# Patient Record
Sex: Male | Born: 1945 | Race: Black or African American | Hispanic: No | Marital: Single | Smoking: Former smoker
Health system: Southern US, Community
[De-identification: ages and names within clinical notes are randomized; demographics above are authoritative.]

## PROBLEM LIST (undated history)

## (undated) DIAGNOSIS — E785 Hyperlipidemia, unspecified: Secondary | ICD-10-CM

## (undated) DIAGNOSIS — IMO0001 Reserved for inherently not codable concepts without codable children: Secondary | ICD-10-CM

## (undated) DIAGNOSIS — C609 Malignant neoplasm of penis, unspecified: Secondary | ICD-10-CM

## (undated) DIAGNOSIS — IMO0002 Reserved for concepts with insufficient information to code with codable children: Secondary | ICD-10-CM

## (undated) DIAGNOSIS — R000 Tachycardia, unspecified: Secondary | ICD-10-CM

## (undated) DIAGNOSIS — I214 Non-ST elevation (NSTEMI) myocardial infarction: Secondary | ICD-10-CM

## (undated) DIAGNOSIS — I639 Cerebral infarction, unspecified: Secondary | ICD-10-CM

## (undated) DIAGNOSIS — I10 Essential (primary) hypertension: Secondary | ICD-10-CM

## (undated) DIAGNOSIS — R591 Generalized enlarged lymph nodes: Secondary | ICD-10-CM

## (undated) HISTORY — DX: Tachycardia, unspecified: R00.0

## (undated) HISTORY — PX: OTHER SURGICAL HISTORY: SHX169

---

## 2000-09-30 ENCOUNTER — Encounter: Payer: Self-pay | Admitting: Internal Medicine

## 2000-09-30 ENCOUNTER — Inpatient Hospital Stay (HOSPITAL_COMMUNITY): Admission: EM | Admit: 2000-09-30 | Discharge: 2000-10-02 | Payer: Self-pay | Admitting: Internal Medicine

## 2000-10-01 ENCOUNTER — Encounter: Payer: Self-pay | Admitting: Internal Medicine

## 2001-12-30 ENCOUNTER — Encounter: Payer: Self-pay | Admitting: Emergency Medicine

## 2001-12-30 ENCOUNTER — Emergency Department (HOSPITAL_COMMUNITY): Admission: EM | Admit: 2001-12-30 | Discharge: 2001-12-30 | Payer: Self-pay | Admitting: Emergency Medicine

## 2002-01-07 ENCOUNTER — Encounter: Payer: Self-pay | Admitting: Emergency Medicine

## 2002-01-07 ENCOUNTER — Inpatient Hospital Stay (HOSPITAL_COMMUNITY): Admission: EM | Admit: 2002-01-07 | Discharge: 2002-01-10 | Payer: Self-pay | Admitting: Emergency Medicine

## 2002-01-09 ENCOUNTER — Encounter: Payer: Self-pay | Admitting: Cardiology

## 2002-08-15 ENCOUNTER — Emergency Department (HOSPITAL_COMMUNITY): Admission: EM | Admit: 2002-08-15 | Discharge: 2002-08-15 | Payer: Self-pay | Admitting: Emergency Medicine

## 2004-12-05 ENCOUNTER — Emergency Department (HOSPITAL_COMMUNITY): Admission: EM | Admit: 2004-12-05 | Discharge: 2004-12-05 | Payer: Self-pay | Admitting: Emergency Medicine

## 2010-03-04 ENCOUNTER — Emergency Department (HOSPITAL_COMMUNITY): Admission: EM | Admit: 2010-03-04 | Discharge: 2010-03-04 | Payer: Self-pay | Admitting: Emergency Medicine

## 2011-01-13 NOTE — H&P (Signed)
[place]. [facility]  Patient:    Norman Mendez, Norman Mendez Visit Number: [id number] MRN: [id number]          Service Type: MED Location: [phone] Attending Physician:  Cathren Laine Dictated by:   Lewayne Bunting, M.D. [facility] Admit Date:  01/07/2002                           History and Physical  REFERRING PHYSICIAN:  None.  CARDIOLOGIST:  New, Dr. Lewayne Bunting.  CURRENT COMPLAINT:  Chest pain.  HISTORY OF PRESENT ILLNESS:  Norman Mendez is a 65 year old African-American male who presents with sudden onset of substernal chest pain.  The patient was awakened at 3 a.m. this morning with severe left-sided sharp substernal chest pain which was unrelenting.  It was, however, not associated with nausea, vomiting, shortness of breath, or diaphoresis.  He did report the pain was somewhat pleuritic in nature.  Despite ongoing pain the patient went to work early in the morning where gradually he felt he was getting worse with increased substernal chest pain and worsening dyspnea.  Subsequently, 911 was called and the patient presented to the emergency room.  The patient demonstrated sinus tachycardia and minor ST elevation in leads I and aVL with T wave inversion.  Electrocardiogram also demonstrated left ventricular hypertrophy.  The patient reported a 9/10 substernal chest pain without radiation to the back and without radiation into either left or right arm. There was a slight discrepancy between the blood pressure in the left and right arm, approximately 20 mmHg.  ALLERGIES:  No known drug allergies.  MEDICATIONS: 1. Hydrocodone p.r.n. pain. 2. Ibuprofen p.r.n. pain.  PAST MEDICAL HISTORY: 1. History of pneumonia. 2. Multiple lacerations, work related.  SOCIAL HISTORY:  The patient lives in [place] by himself.  He raises a 32 year old daughter.  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  He smokes two packs a day for the last 40 years.  The patient does drink  alcohol, occasionally a beer a day.  Denies any drug use.  FAMILY HISTORY:  Mother died from diabetes mellitus at age 18.  Father died in his 60s and had history of alcohol use.  He has several siblings without known coronary artery disease.  REVIEW OF SYSTEMS:  No fever or chills.  No headache or sore throat.  No rash or lesions.  Positive for chest pain as outlined above.  No dyspnea on exertion or PND, palpitations, or syncope.  No frequency or dysuria.  Positive for myalgias and arthralgias in right shoulder.  No nausea or vomiting.  No polyuria or polydipsia.  PHYSICAL EXAMINATION:  VITAL SIGNS:  Blood pressure 138/76 in the right arm, 156/85 in the left arm. Respirations 20, heart rate 80 beats per minute.  Weight 155.  GENERAL:  An African-American male with somewhat flat affect but in no acute distress.  HEENT:  [place] ARB/AT.  PERRLA, EOMI.  NECK:  Supple.  No bruit or JVD.  HEART:  Regular rate and rhythm, normal S1, S2.  LUNGS:  Clear.  SKIN:  No rash or lesions.  ABDOMEN:  Soft, nontender.  GENITOURINARY AND RECTAL:  Deferred.  Abdominal aorta is palpable but does not appear to be dilated.  EXTREMITIES:  No cyanosis, clubbing, or edema.  NEUROLOGIC:  Patient is alert, oriented, and grossly nonfocal.  LABORATORY DATA:  Chest x-ray:  No acute changes; infiltrate, left lower lobe with possible pleural effusion.  EKG:  Heart rate  85 beats per minute, rhythm is normal sinus rhythm; significant LVH with strain pattern; borderline ST segment changes in I and aVL.  Hemoglobin 15.4, hematocrit 45.2, white count 10.2.  Sodium 137, potassium 3.9, chloride 103, bicarb 26, BUN 6, creatinine 0.8, blood sugar 112.  Enzymes show CK 575, MB 32.4, troponin 1.05.  IMPRESSION AND PLAN: 1. Substernal chest pain.  The patient does report chest pain that is    concerning for an acute coronary syndrome.  A 12-lead electrocardiogram    shows concerning changes in I and aVL.  However,  bedside echocardiogram    does not show significant wall motion abnormalities.  There is significant    left ventricular hypertrophy.  The patient, however, has ongoing pain on    aspirin, beta blocker, nitroglycerin, and heparin.  The patient will    proceed with diagnostic cardiac catheterization due to diagnostic    uncertainty.  I doubt the patient has significant coronary artery disease,    and if this is the case further diagnosis should be pursued.  Aortogram    will need to be done to rule out dissection.  D-dimer has been ordered    to rule out pulmonary embolism.  Chest x-ray demonstrates a left lower lobe    infiltrate; cannot rule out pulmonary infarction. 2. Left lower lobe infiltrate, rule out pneumonia versus pulmonary infarct    versus pleural effusion. 3. Left ventricular hypertrophy with secondary electrocardiogram changes. 4. Disposition.  I have discussed the risks and benefits of a diagnostic    catheterization and the patient has agreed to proceed.  This will help [place]    rule out any diagnostic uncertainty.Dictated by:   Lewayne Bunting, M.D. [facility]  Attending Physician:  Cathren Laine DD:  01/07/02 TD:  01/07/02 Job: [id number]

## 2011-01-13 NOTE — H&P (Signed)
[place]. [facility]  Patient:    Norman Mendez, Norman Mendez Visit Number: [id number] MRN: [id number]          Service Type: EMS Location: [place] Attending Physician:  Cathren Laine Dictated by:   Lewayne Bunting, M.D. [facility] Admit Date:  01/07/2002                           History and Physical  No dictation. Dictated by:   Lewayne Bunting, M.D. [facility] Attending Physician:  Cathren Laine DD:  01/07/02 TD:  01/07/02 Job: [id number]

## 2011-01-13 NOTE — Cardiovascular Report (Signed)
[place]. [facility]  Patient:    Norman Mendez, Norman Mendez Visit Number: [id number] MRN: [id number]          Service Type: MED Location: [phone] Attending Physician:  Learta Codding Dictated by:   Noralyn Pick. Eden Emms, M.D. [facility] Admit Date:  01/07/2002                          Cardiac Catheterization  INDICATIONS:  A 65 year old patient of Dr. Andee Lineman admitted with chest pain. The patient had significant substernal chest pain with CPKs that were positive.  The case was done on a semi-emergent basis.  RESULTS: 1. The left main coronary artery was normal.  2. The left anterior descending artery had 30% multiple discrete lesions in    the mid and distal vessel.  The first and second diagonal branch were small    without critical lesions.  3. The left circumflex coronary artery was dominant.  There was a very large    first obtuse marginal branch.  The distal portion of this vessel had a    70-80% tubular type lesion before a large bifurcation point.  The distal    circumflex had a PDA and 3 posterolateral branches and was normal.  4. The right coronary artery was small and nondominant.  There was a 50-60%    lesion proximally.  RAO ventriculography:  RAO ventriculography was normal.  Ejection fraction was 60%.  There was no gradient across the aortic valve and no MR.  Aortic pressure was 150/88.  LV pressure was 151/11.  Because of the patients significant chest pain and no lesions with TIMI-2 flow or less, aortic root and distal aortic injection were performed.  There was no evidence of aortic dissection or abdominal aortic aneurysm.  IMPRESSION:  Positive CPKs and chest pain.  I suspect the culprit lesion is the distal circumflex obtuse marginal.  The films will be reviewed with Dr. Riley Kill and he will most likely undergo angioplasty of this obtuse marginal.  Since the patient has a question of a left lower lobe infiltrate or effusion and a positive  d-dimer, I still think it may be worthwhile to do a followup CT scan of his chest in the morning.  Further recommendations will be based on the review by Dr. Riley Kill. Dictated by:   Noralyn Pick Eden Emms, M.D. [facility] Attending Physician:  Learta Codding DD:  01/07/02 TD:  01/08/02 Job: [id number]

## 2011-01-13 NOTE — H&P (Signed)
[place]. [facility]  Patient:    Norman Mendez, Norman Mendez Visit Number: [id number] MRN: [id number]          Service Type: EMS Location: [place] Attending Physician:  Cathren Laine Dictated by:   Lewayne Bunting, M.D. [facility] Admit Date:  01/07/2002                           History and Physical  INCOMPLETE  CARDIOLOGIST:  Samara Deist, M.D.  CHIEF COMPLAINT:  Chest pain.  HISTORY OF PRESENT ILLNESS:  Norman Mendez is a 65 year old African-American male who presents with sudden onset of substernal chest pain.  The patient was awoken at 3 oclock this morning with severe left-sided sharp substernal chest pain which was unrelenting.  It was not associated with nausea, vomiting, shortness of breath, or diaphoresis.  He did report the pain was somewhat pleuritic in nature.  Despite ongoing pain, the patient went to work early in the morning, where gradually he felt he was getting worse with increased substernal chest pain and worsening dyspnea.  Subsequently 911 was called and the patient presented to the emergency room.  The patient demonstrates sinus tachycardia and minor ST elevation in leads I and aVL with a T-wave inversion. Electrocardiogram also demonstrates left ventricular hypertrophy.  The patient reported a 9/10 substernal chest pain without radiation to the back and without radiation into either left or right arm.  There was a slight discrepancy between the blood pressure in the left and right arm, approximately 20 mmHg.  ALLERGIES:  No known drug allergies.  MEDICATIONS: 1. Hydrocodone p.r.n. pain. 2. Ibuprofen p.r.n. pain.  PAST MEDICAL HISTORY: 1. History of pneumonia. 2. Multiple lacerations, work-related.  SOCIAL HISTORY:  The patient lives in [place] by himself.  He raises a 56 year old daughter.  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  He smokes two packs a day for the last 40 years.  The patient does drink alcohol occasionally, a beer a  day.  He denies any drug use.  FAMILY HISTORY:  His mother died from diabetes mellitus at age 22.  Father died in his 9s and has a history of alcohol use.  He has several siblings without known coronary artery disease.  REVIEW OF SYSTEMS:  No fevers or chills.  No headache or sore throat.  No rash or lesions.  Positive for chest pain as outlined above.  No dyspnea on exertion, PND, palpitations, or syncope.  No frequency or dysuria.  Positive for myalgias and arthralgias in the right shoulder.  No nausea or vomiting. No polyuria or polydipsia.  PHYSICAL EXAMINATION:  VITAL SIGNS:  Blood pressure 138/76 in the right arm, 156/85 in the left arm. Respirations 20.  Heart rate 80 beats per minute.  Weight 155 pounds.  GENERAL:  An African-American male with somewhat flat affect but in no acute distress.  HEENT:  NCAT, PERRLA, EOMI.  NECK:  Supple.  No bruit or JVD.  CHEST:  Lungs clear.  SKIN:  No rash or lesions.  CARDIAC:  Regular rate and rhythm, normal S1, S2.  ABDOMEN:  Soft, nontender.  Abdominal aorta is palpable but does not appear to be dilated.  GENITOURINARY/RECTAL:  Deferred.  EXTREMITIES:  No cyanosis, clubbing, or edema.  NEUROLOGIC:  The patient is alert and oriented and grossly nonfocal.  LABORATORY DATA:  Chest x-ray:  No acute changes.  Infiltrate left lower lobe with possible pleural effusion.  EKG:  Heart rate 85 beats  per minute.  The rhythm is normal sinus rhythm.  Significant LVH with strain pattern. Borderline ST segment changes in I, aVL.  Hemoglobin 15.4, hematocrit 45.2, white count 10.2.  Sodium 137, potassium 3.9, chloride 103, bicarb 26, BUN 6, creatinine 0.8, blood sugar 112. Enzymes:  CK 575, MB 32.4, troponin 1.05.  IMPRESSION AND PLAN: 1. Substernal chest pain.  The patient does report chest pain that is concerning for an acute coronary syndrome.  Twelve-lead electrocardiogram shows concerning changes in I and aVL.  However, bedside  echocardiogram does not show significant wall motion abnormalities.  There is significant left ventricular hypertrophy.  The patient, however, has ongoing pain on aspirin, beta blocker, nitroglycerin, and heparin.  The patient will proceed with diagnostic cardiac catheterization due to a diagnostic uncertainty at that, the patient has significant coronary artery disease and if this is the case, further diagnosis should be pursued.  Aortogram will need to be done to rule out dissection.  A D-dimer has been ordered to rule out pulmonary embolus. Dictated by:   Lewayne Bunting, M.D. [facility] Attending Physician:  Cathren Laine DD:  01/07/02 TD:  01/07/02 Job: [id number]

## 2011-01-13 NOTE — Discharge Summary (Signed)
[place]. [facility]  Patient:    Norman Mendez, Norman Mendez Visit Number: [id number] MRN: [id number]          Service Type: MED Location: [phone] Attending Physician:  Learta Codding Dictated by:   Pennelope Bracken, N.P. Admit Date:  01/07/2002 Disc. Date: 01/10/02   CC:         [facility]   Discharge Summary  DATE OF BIRTH:  [birth year].  REASON FOR ADMISSION:  Chest pain.  DISCHARGE DIAGNOSES: 1. Coronary artery disease, status post angiography this admission revealing    normal left main, left anterior descending 30% stenosis midvessel and    distally, large left-dominant circumflex, first obtuse marginal distal    lesion estimated to be 70-80% with normal flow.  Right coronary artery was    small and nondominant, 50-60% proximal stenosis.  Left ventricular function    normal with an ejection fraction of 60%.  Aorta revealing no dissection. 2. 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 3. Postcatheterization hematoma, right femoral artery site. 4. Poor health maintenance, no primary care Charvis Lightner. 5. Tobacco abuse.  HISTORY OF PRESENT ILLNESS:  This delightful 65 year old African-American gentleman with cardiac history as outlined above was awakened from sleep at 3 a.m. the day of admission with a sudden-onset substernal chest pain.  The patient was unrelieved by positional change or Goody powders and was not associated with nausea, vomiting, or diaphoresis.  The patient went to work that morning with the pain, and it gradually worsened and he became dyspneic. Emergency services was contacted and he was brought here to the emergency room, where he was found to be in sinus tachycardia with minor ST elevation in leads I and aVL and T-wave inversion in both aVR and aVL.  He continued in the ED with 9/10 substernal chest pain that was not immediately relieved with nitroglycerin.  Baseline cardiac  enzymes were found to be elevated, and so he was admitted and taken to the cardiac catheterization lab urgently.  HOSPITAL COURSE:  Coronary angiography was performed by Theron Arista C. Eden Emms, M.D., with findings as detailed above.  The patient tolerated the procedure well, and films were reviewed by Arturo Morton. Riley Kill, M.D., and Lewayne Bunting, M.D.  It was elected not to proceed with an intervention given that the patient had sustained a large hematoma at his sheath site.  Pressure was applied to this area with excellent resolution, and the patient recovered uneventfully.  Throughout the hospitalization the patient did experience some early short spells of fleeting sharp left anterior chest pain.  These were treated with nitroglycerin and a p.o. nitrate was added to his regimen of aspirin, beta blocker, and heparin.  A CT chest was performed, and this was negative for abnormality.  An adenosine Cardiolite was performed to assess for evidence of ischemia, and this showed a fixed anterolateral defect consistent with angiographic findings.  No evidence of ischemia was seen, and EF was confirmed at 60%.  The patient experienced no further chest pain.  His IV nitroglycerin and heparin were discontinued and on May 16, he was deemed suitable for discharge by Luis Abed, M.D.  DISCHARGE PHYSICAL EXAMINATION:  VITAL SIGNS:  Telemetry revealed normal sinus rhythm x24 hours.  Vital signs as follows:  117/70, 72, 20, 98% on room air.  GENERAL:  On the day of discharge the patient offered no complaints of chest pain or dyspnea and was anxious to go home.  In no acute distress.  NECK:  No JVD.  CHEST:  Clear to auscultation bilaterally.  CARDIAC:  Regular rate and rhythm, S1, S2.  No obvious murmurs.  EXTREMITIES:  Without clubbing, cyanosis, or edema.  Catheter site benign.  No signs of hematoma or bruit.  LABORATORY DATA:  Chemistry day of discharge as follows:  Sodium 137, potassium 4.0,  chloride 104, CO2 27, BUN 8, creatinine 0.8, glucose 103.  PTT was 28.  Hemogram as follows:  WBC 8.0, hemoglobin 13.4, hematocrit 39.4, and platelets 209.  Cardiac enzymes:  Set #1, CK 723, MB 50.0, Troponin I 3.72. Set #2, CK 950, MB 65.4, troponin I 7.31.  Set #3, CK 821, MB 33.5, troponin I 8.29.  D-dimer negative.  Lipids were as follows:  Total cholesterol 210, triglycerides 88, HDL 68, LDL 124.  TSH 0.987.  Twelve-lead EKG day of discharge showed normal sinus rhythm with signs of left ventricular hypertrophy, axis normal.  Chest x-ray on admission revealed small infiltrate left lower lobe with possible pleural effusion.  DISPOSITION:  The patient is discharged to home.  DISCHARGE MEDICATIONS: 1. Aspirin 325 mg one q.d. 2. Metoprolol 25 mg one b.i.d. 3. Lipitor 10 mg one q.d. 4. Imdur 30 mg one q.d. 5. Nitroglycerin 0.4 mg sublingual one every five minutes x3 p.r.n. chest    pain.  DISCHARGE INSTRUCTIONS:  The patient is advised to back off on ibuprofen for awhile until his groin stabilizes.  Activity:  No heavy lifting, sexual activity, or tub baths x2 days.  He is urged to walk 30 minutes a day and may return to work on Monday.  Diet recommended:  A heart-healthy diet.  The patient agrees to call the office if his groin wound changes in any way.  FOLLOW-UP  Follow-up will be as arranged at [facility] outpatient clinic on Tuesday, May 20, at 2:30.  The patient was given explicit directions and agrees to come to the clinic for management of his cardiac risk factors. Follow-up with Dian Queen, P.A.C., is scheduled for May 23 at 3:30.  The patient will see Dr. Andee Lineman on June 16 at 3 oclock at the office.  He will stop by the office today for samples of his antihypertensives, and much discussion was given to the importance of tobacco cessation counseling. He agrees to call in the interim with any questions or concerns, change or increase in symptoms.Dictated by:   Pennelope Bracken, N.P. Attending Physician:  Learta Codding  DD:  01/10/02 TD:  01/10/02 Job: [id number]

## 2011-11-12 ENCOUNTER — Encounter (HOSPITAL_COMMUNITY): Payer: Self-pay | Admitting: *Deleted

## 2011-11-12 ENCOUNTER — Emergency Department (HOSPITAL_COMMUNITY)
Admission: EM | Admit: 2011-11-12 | Discharge: 2011-11-12 | Disposition: A | Discharge status: Home / Self Care | Payer: Medicare Other | Attending: Emergency Medicine | Admitting: Emergency Medicine

## 2011-11-12 ENCOUNTER — Emergency Department (HOSPITAL_COMMUNITY): Payer: Medicare Other

## 2011-11-12 ENCOUNTER — Other Ambulatory Visit: Payer: Self-pay

## 2011-11-12 DIAGNOSIS — F17200 Nicotine dependence, unspecified, uncomplicated: Secondary | ICD-10-CM | POA: Insufficient documentation

## 2011-11-12 DIAGNOSIS — R066 Hiccough: Secondary | ICD-10-CM

## 2011-11-12 MED ORDER — CHLORPROMAZINE HCL 50 MG PO TABS: 50.0000 mg | ORAL_TABLET | Freq: Three times a day (TID) | ORAL | Status: DC

## 2011-11-12 NOTE — ED Provider Notes (Signed)
History     CSN: [id number]  Arrival date & time 11/12/11  1148   First MD Initiated Contact with Patient 11/12/11 1230      Chief Complaint  Patient presents with  . Hiccups    (Consider location/radiation/quality/duration/timing/severity/associated sxs/prior treatment) HPI Comments: The patient presents complaining of a one week history of persistent hiccups.  He denies any other symptoms.  It is worse at night and causing to not be able to sleep.  There are no alleviating factors.  He smokes "about three cigarettes per week".  No cough, chest pain, or shortness of breath.  The history is provided by the patient.    History reviewed. No pertinent past medical history.  History reviewed. No pertinent past surgical history.  History reviewed. No pertinent family history.  History  Substance Use Topics  . Smoking status: Current Everyday Smoker    Types: Cigarettes  . Smokeless tobacco: Not on file  . Alcohol Use: Yes      Review of Systems  All other systems reviewed and are negative.    Allergies  Review of patient's allergies indicates no known allergies.  Home Medications  No current outpatient prescriptions on file.  BP 133/89  Pulse 139  Temp(Src) 99.9 F (37.7 C) (Oral)  Resp 20  SpO2 95%  Physical Exam  Nursing note and vitals reviewed. Constitutional: He is oriented to person, place, and time. He appears well-developed and well-nourished.  HENT:  Head: Normocephalic and atraumatic.  Neck: Normal range of motion. Neck supple.  Cardiovascular: Normal rate and regular rhythm.  Exam reveals no friction rub.   No murmur heard. Pulmonary/Chest: Effort normal and breath sounds normal. No respiratory distress.  Abdominal: Soft. Bowel sounds are normal. He exhibits no distension. There is no tenderness.  Musculoskeletal: Normal range of motion. He exhibits no edema.  Neurological: He is alert and oriented to person, place, and time.  Skin: Skin is warm  and dry. He is not diaphoretic.    ED Course  Procedures (including critical care time)  Labs Reviewed - No data to display No results found.   No diagnosis found.    MDM  The xray shows an elevated left hemidiaphragm, but no other acute process.  Will prescribe thorazine, follow up with pcp if not getting better.        Geoffery Lyons, MD 11/12/11 1335

## 2011-11-12 NOTE — ED Notes (Signed)
Reports having hiccups x 1 week, no other complaints. HR is 140 at triage, ekg being done. Airway intact, resp e/u.

## 2011-11-12 NOTE — Discharge Instructions (Signed)
 Hiccups Hiccups are caused by a sudden contraction of the muscles between the ribs and the muscle under your lungs (diaphragm). When you hiccup, the top of your windpipe (glottis) closes immediately after your diaphragm contracts. This makes the typical 'hic' sound. A hiccup is a reflex that you cannot stop. Unlike other reflexes such as coughing and sneezing, hiccups do not seem to have any useful purpose. There are 3 types of hiccups:   Benign bouts: last less than 48 hours.   Persistent: last more than 48 hours but less than 1 month.   Intractable: last more than 1 month.  CAUSES  Most people have bouts of hiccups from time to time. They start for no apparent reason, last a short while, and then stop. Sometimes they are due to:  A temporary swollen stomach caused by overeating or eating too fast, eating spicy foods, drinking fizzy drinks, or swallowing air.   A sudden change in temperature (very hot or cold foods or drinks, a cold shower).   Drinking alcohol or using tobacco.  There are no particular tests used to diagnose hiccups. Hiccups are usually considered harmless and do not point to a serious medical condition. However, there can be underlying medical problems that may cause hiccups, such as pneumonia, diabetes, metabolic problems, tumors, abdominal infections or injuries, and neurologic problems.You must follow up with your caregiver if your symptoms persist or become a frequent problem. TREATMENT   Most cases need no treatment. A bout of benign hiccups usually does not last long.   Medicine is sometimes needed to stop persistent hiccups. Medicine may be given intravenously (IV) or by mouth.   Hypnosis or acupuncture may be suggested.   Surgery to affect the nerve that supplies the diaphragm may be tried in severe cases.   Treatment of an underlying cause is needed in some cases.  HOME CARE INSTRUCTIONS  Popular remedies that may stop a bout of hiccups include:  Gargling  ice water.   Swallowing granulated sugar.   Biting on a lemon.   Holding your breath, breathing fast, or breathing into a paper bag.   Bearing down.   Gasping after a sudden fright.   Pulling your tongue gently.   Distraction.  SEEK MEDICAL CARE IF:   Hiccups last for more than 48 hours.   You are given medicine but your hiccups do not get better.   New symptoms show up.   You cannot sleep or eat due to the hiccups.   You have unexpected weight loss.   You have trouble breathing or swallowing.   You develop severe pain in your abdomen or other areas.   You develop numbness, tingling, or weakness.  Document Released: 10/23/2001 Document Revised: 08/03/2011 Document Reviewed: 10/05/2010 [facility] Patient Information 2012 [place], [place].

## 2011-11-15 ENCOUNTER — Emergency Department (HOSPITAL_COMMUNITY): Payer: Medicare Other

## 2011-11-15 ENCOUNTER — Encounter (HOSPITAL_COMMUNITY): Payer: Self-pay | Admitting: Emergency Medicine

## 2011-11-15 ENCOUNTER — Observation Stay (HOSPITAL_COMMUNITY)
Admission: EM | Admit: 2011-11-15 | Discharge: 2011-11-18 | Disposition: A | Discharge status: Home / Self Care | Payer: Medicare Other | Attending: Internal Medicine | Admitting: Internal Medicine

## 2011-11-15 ENCOUNTER — Other Ambulatory Visit: Payer: Self-pay

## 2011-11-15 DIAGNOSIS — R0602 Shortness of breath: Secondary | ICD-10-CM | POA: Insufficient documentation

## 2011-11-15 DIAGNOSIS — E871 Hypo-osmolality and hyponatremia: Secondary | ICD-10-CM | POA: Diagnosis present

## 2011-11-15 DIAGNOSIS — R000 Tachycardia, unspecified: Secondary | ICD-10-CM | POA: Diagnosis present

## 2011-11-15 DIAGNOSIS — R5381 Other malaise: Secondary | ICD-10-CM | POA: Insufficient documentation

## 2011-11-15 DIAGNOSIS — F17200 Nicotine dependence, unspecified, uncomplicated: Secondary | ICD-10-CM | POA: Insufficient documentation

## 2011-11-15 DIAGNOSIS — R7401 Elevation of levels of liver transaminase levels: Secondary | ICD-10-CM | POA: Diagnosis present

## 2011-11-15 DIAGNOSIS — I498 Other specified cardiac arrhythmias: Secondary | ICD-10-CM | POA: Insufficient documentation

## 2011-11-15 DIAGNOSIS — Z9181 History of falling: Secondary | ICD-10-CM | POA: Insufficient documentation

## 2011-11-15 DIAGNOSIS — R079 Chest pain, unspecified: Principal | ICD-10-CM | POA: Diagnosis present

## 2011-11-15 DIAGNOSIS — R7989 Other specified abnormal findings of blood chemistry: Secondary | ICD-10-CM | POA: Insufficient documentation

## 2011-11-15 DIAGNOSIS — Z720 Tobacco use: Secondary | ICD-10-CM | POA: Diagnosis present

## 2011-11-15 DIAGNOSIS — J189 Pneumonia, unspecified organism: Secondary | ICD-10-CM

## 2011-11-15 DIAGNOSIS — R066 Hiccough: Secondary | ICD-10-CM | POA: Diagnosis present

## 2011-11-15 DIAGNOSIS — R599 Enlarged lymph nodes, unspecified: Secondary | ICD-10-CM | POA: Insufficient documentation

## 2011-11-15 DIAGNOSIS — R42 Dizziness and giddiness: Secondary | ICD-10-CM | POA: Diagnosis present

## 2011-11-15 DIAGNOSIS — R509 Fever, unspecified: Secondary | ICD-10-CM | POA: Insufficient documentation

## 2011-11-15 LAB — DIFFERENTIAL
Lymphocytes Relative: 23 % (ref 12–46)
Lymphs Abs: 2.1 10*3/uL (ref 0.7–4.0)
Monocytes Absolute: 1 10*3/uL (ref 0.1–1.0)
Monocytes Relative: 10 % (ref 3–12)
Neutro Abs: 6 10*3/uL (ref 1.7–7.7)

## 2011-11-15 LAB — CBC
MCV: 87.8 fL (ref 78.0–100.0)
RBC: 4.67 MIL/uL (ref 4.22–5.81)
WBC: 9.1 10*3/uL (ref 4.0–10.5)

## 2011-11-15 LAB — POCT I-STAT TROPONIN I: Troponin i, poc: 0.01 ng/mL (ref 0.00–0.08)

## 2011-11-15 LAB — LACTIC ACID, PLASMA: Lactic Acid, Venous: 1.5 mmol/L (ref 0.5–2.2)

## 2011-11-15 LAB — BASIC METABOLIC PANEL
BUN: 10 mg/dL (ref 6–23)
CO2: 26 mEq/L (ref 19–32)
Calcium: 9 mg/dL (ref 8.4–10.5)
Chloride: 90 mEq/L — ABNORMAL LOW (ref 96–112)
GFR calc non Af Amer: 59 mL/min — ABNORMAL LOW (ref >90)
Glucose, Bld: 169 mg/dL — ABNORMAL HIGH (ref 70–99)

## 2011-11-15 MED ORDER — NITROGLYCERIN 0.4 MG SL SUBL
0.4000 mg | SUBLINGUAL_TABLET | SUBLINGUAL | Status: DC | PRN
  Filled 2011-11-15: qty 25

## 2011-11-15 MED ORDER — METOPROLOL TARTRATE 1 MG/ML IV SOLN
5.0000 mg | Freq: Once | INTRAVENOUS | Status: AC
  Administered 2011-11-15: 5 mg via INTRAVENOUS
  Filled 2011-11-15: qty 5

## 2011-11-15 MED ORDER — ASPIRIN 81 MG PO CHEW
324.0000 mg | CHEWABLE_TABLET | Freq: Once | ORAL | Status: AC
  Administered 2011-11-15: 324 mg via ORAL
  Filled 2011-11-15: qty 4

## 2011-11-15 MED ORDER — SODIUM CHLORIDE 0.9 % IV BOLUS (SEPSIS)
1000.0000 mL | Freq: Once | INTRAVENOUS | Status: AC
  Administered 2011-11-15: 1000 mL via INTRAVENOUS

## 2011-11-15 NOTE — ED Notes (Signed)
PT. BECAME LETHARGIC / UNRESPONSIVE WHILE WAITING AT TRIAGE , CHARGE NURSE NOTIFIED WILL MOVE TO A ROOM ASAP.

## 2011-11-15 NOTE — ED Notes (Signed)
Patient with chest pain, denies any pain at this time.  Patient denies any shortness of breath at this time.  Patient is tachycardic in the 120's, tachypneic in the 30's.  He is CAOx3, sleepy.

## 2011-11-15 NOTE — ED Notes (Signed)
PT. REPORTS MID STERNAL CHEST PAIN RADIATING TO UPPER BACK WITH SOB ONSET TODAY , DENIES NAUSEA , VOMITTING OR DIAPHORESIS.

## 2011-11-15 NOTE — ED Notes (Signed)
Patient awake, talking in full sentence, sleepy.

## 2011-11-15 NOTE — ED Provider Notes (Signed)
History     CSN: [id number]  Arrival date & time 11/15/11  2143   First MD Initiated Contact with Patient 11/15/11 2255      Chief Complaint  Patient presents with  . Chest Pain    (Consider location/radiation/quality/duration/timing/severity/associated sxs/prior treatment) Patient is a 66 y.o. male presenting with chest pain. The history is provided by the patient. No language interpreter was used.  Chest Pain The chest pain began 3 - 5 hours ago. Chest pain occurs intermittently. The chest pain is unchanged. The severity of the pain is moderate. The quality of the pain is described as aching and dull. The pain radiates to the upper back. Exacerbated by: no exacerbating or alleviating measures. Primary symptoms include fatigue and shortness of breath. Pertinent negatives for primary symptoms include no fever, no cough, no palpitations, no abdominal pain, no nausea, no vomiting and no dizziness.  The fatigue began today. The fatigue has been worsening since its onset.  The shortness of breath began today. The shortness of breath developed gradually. The shortness of breath is mild.  Pertinent negatives for associated symptoms include no diaphoresis, no lower extremity edema, no near-syncope, no numbness, no orthopnea, no paroxysmal nocturnal dyspnea and no weakness. He tried nothing for the symptoms. Risk factors include male gender.     History reviewed. No pertinent past medical history.  History reviewed. No pertinent past surgical history.  No family history on file.  History  Substance Use Topics  . Smoking status: Current Everyday Smoker    Types: Cigarettes  . Smokeless tobacco: Not on file  . Alcohol Use: Yes      Review of Systems  Constitutional: Positive for fatigue. Negative for fever, chills, diaphoresis, activity change and appetite change.  HENT: Negative for congestion, sore throat, rhinorrhea, neck pain and neck stiffness.   Respiratory: Positive for  shortness of breath. Negative for cough.   Cardiovascular: Positive for chest pain. Negative for palpitations, orthopnea and near-syncope.  Gastrointestinal: Negative for nausea, vomiting and abdominal pain.  Genitourinary: Negative for dysuria, urgency, frequency and flank pain.  Musculoskeletal: Positive for back pain. Negative for myalgias and arthralgias.  Neurological: Negative for dizziness, weakness, light-headedness, numbness and headaches.  All other systems reviewed and are negative.    Allergies  Review of patient's allergies indicates no known allergies.  Home Medications   Current Outpatient Rx  Name Route Sig Dispense Refill  . CHLORPROMAZINE HCL 50 MG PO TABS Oral Take 1 tablet (50 mg total) by mouth 3 (three) times daily. 25 tablet 0    BP 96/63  Pulse 90  Temp(Src) 98.5 F (36.9 C) (Oral)  Resp 22  SpO2 98%  Physical Exam  Nursing note and vitals reviewed. Constitutional: He is oriented to person, place, and time. He appears well-developed and well-nourished.  HENT:  Head: Normocephalic and atraumatic.  Mouth/Throat: Oropharynx is clear and moist.  Eyes: Conjunctivae and EOM are normal. Pupils are equal, round, and reactive to light.  Neck: Normal range of motion. Neck supple.  Cardiovascular: Regular rhythm, normal heart sounds and intact distal pulses.  Exam reveals no gallop and no friction rub.   No murmur heard.      Tachycardic rate  Pulmonary/Chest: Effort normal. No respiratory distress. He has no wheezes. He has no rales.       Diminished breath sounds diffusely  Abdominal: Soft. Bowel sounds are normal. There is no tenderness.  Musculoskeletal: Normal range of motion. He exhibits no edema and no tenderness.  Neurological:  He is alert and oriented to person, place, and time. He has normal strength. No cranial nerve deficit or sensory deficit.  Skin: Skin is warm and dry. No rash noted.    ED Course  Procedures (including critical care time)    Date: 11/15/2011  Rate: 141  Rhythm: sinus tachycardia  QRS Axis: normal  Intervals: normal  ST/T Wave abnormalities: normal  Conduction Disutrbances:none  Narrative Interpretation:   Old EKG Reviewed: unchanged  Labs Reviewed  BASIC METABOLIC PANEL - Abnormal; Notable for the following:    Sodium 130 (*)    Chloride 90 (*)    Glucose, Bld 169 (*)    GFR calc non Af Amer 59 (*)    GFR calc Af Amer 69 (*)    All other components within normal limits  GLUCOSE, CAPILLARY - Abnormal; Notable for the following:    Glucose-Capillary 184 (*)    All other components within normal limits  D-DIMER, QUANTITATIVE - Abnormal; Notable for the following:    D-Dimer, Quant 2.45 (*)    All other components within normal limits  CBC  DIFFERENTIAL  POCT I-STAT TROPONIN I  PRO B NATRIURETIC PEPTIDE  LACTIC ACID, PLASMA  URINALYSIS, ROUTINE W REFLEX MICROSCOPIC   Dg Chest 2 View  11/15/2011  *RADIOLOGY REPORT*  Clinical Data: Chest pain and weakness  CHEST - 2 VIEW  Comparison: 11/12/2011  Findings: Heart size is normal.  The lung volumes are low.  There is asymmetric elevation of the left hemi diaphragm.  Blunting of the left costophrenic angle is unchanged from previous exam.  Mild plate-like atelectasis is noted in the lung bases.  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  IMPRESSION:  1.  Interval decrease in lung volumes.  Original Report Authenticated By: Rosealee Albee, M.D.   Ct Angio Chest W/cm &/or Wo Cm  11/16/2011  *RADIOLOGY REPORT*  Clinical Data: Evaluate for pulmonary embolus  CT ANGIOGRAPHY CHEST  Technique:  Multidetector CT imaging of the chest using the standard protocol during bolus administration of intravenous contrast. Multiplanar reconstructed images including MIPs were obtained and reviewed to evaluate the vascular anatomy.  Contrast: OMNIPAQUE IOHEXOL 350 MG/ML IV SOLN  Comparison: None available  Findings: No abnormal filling defects identified  within the main pulmonary artery or their branches to suggest an acute embolus.  No axillary or supraclavicular adenopathy.  The enlarged right hilar lymph nodes are identified.  For example, image 127 lymph node measures 1.3 cm.  The right infrahilar lymph node measures 1.30 cm, image 148.  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  There is airspace consolidation and ground-glass attenuation involving the right lower lobe.  Review of the visualized osseous structures shows no aggressive bone lesions.  Limited imaging through the upper abdomen is unremarkable.  IMPRESSION:  1.   No acute pulmonary embolus. 2.  Airspace consolidation and ground-glass attenuation in the right lower lobe.  In the acute setting this most likely represents an infectious pneumonia.  This will need radiographic followup to ensure resolution and to rule out underlying malignancy. 2.  Multiple enlarged right hilar lymph nodes.  Indeterminate. Differential considerations include lymphoma, metastatic adenopathy or reactive adenopathy.  Careful clinical correlation is advised.  Original Report Authenticated By: Rosealee Albee, M.D.     1. Chest pain   2. CAP (community acquired pneumonia)   3. Tachycardia       MDM  Chest pain with associated tachycardia. There is concern for ACS despite a negative troponin.  D-dimer was elevated so a CT angioma performed which showed no evidence of PE but did show community acquired pneumonia. Was placed on ceftriaxone and azithromycin. Received aspirin. Was pain-free on arrival to emergency department. Administered metoprolol IV fluids with improvement of his rate. Given the complex of his symptoms I feel he warrants admission to the hospital for observation. I feel he will warrants a rule out MI as he developed chest pain with tachycardia. I feel this is equivalent to a failed stress test. Discussed with the [facility]ad hospitalist who accepted the patient for admission        Dayton Bailiff, MD 11/16/11 0207

## 2011-11-16 ENCOUNTER — Observation Stay (HOSPITAL_COMMUNITY): Payer: Medicare Other

## 2011-11-16 ENCOUNTER — Emergency Department (HOSPITAL_COMMUNITY): Payer: Medicare Other

## 2011-11-16 ENCOUNTER — Encounter (HOSPITAL_COMMUNITY): Payer: Self-pay | Admitting: Radiology

## 2011-11-16 DIAGNOSIS — R000 Tachycardia, unspecified: Secondary | ICD-10-CM | POA: Diagnosis present

## 2011-11-16 DIAGNOSIS — Z720 Tobacco use: Secondary | ICD-10-CM | POA: Diagnosis present

## 2011-11-16 DIAGNOSIS — I369 Nonrheumatic tricuspid valve disorder, unspecified: Secondary | ICD-10-CM

## 2011-11-16 DIAGNOSIS — R42 Dizziness and giddiness: Secondary | ICD-10-CM | POA: Diagnosis present

## 2011-11-16 DIAGNOSIS — E871 Hypo-osmolality and hyponatremia: Secondary | ICD-10-CM | POA: Diagnosis present

## 2011-11-16 DIAGNOSIS — R079 Chest pain, unspecified: Secondary | ICD-10-CM | POA: Diagnosis present

## 2011-11-16 DIAGNOSIS — R066 Hiccough: Secondary | ICD-10-CM | POA: Diagnosis present

## 2011-11-16 HISTORY — DX: Tachycardia, unspecified: R00.0

## 2011-11-16 LAB — CARDIAC PANEL(CRET KIN+CKTOT+MB+TROPI)
CK, MB: 0.8 ng/mL (ref 0.3–4.0)
Relative Index: 0.6 (ref 0.0–2.5)
Relative Index: 0.6 (ref 0.0–2.5)
Relative Index: 0.9 (ref 0.0–2.5)
Total CK: 120 U/L (ref 7–232)
Troponin I: <0.3 ng/mL (ref <0.30)
Troponin I: <0.3 ng/mL (ref <0.30)

## 2011-11-16 LAB — HEPATIC FUNCTION PANEL
ALT: 69 U/L — ABNORMAL HIGH (ref 0–53)
AST: 104 U/L — ABNORMAL HIGH (ref 0–37)
Albumin: 2 g/dL — ABNORMAL LOW (ref 3.5–5.2)
Bilirubin, Direct: <0.1 mg/dL (ref 0.0–0.3)
Total Protein: 6.7 g/dL (ref 6.0–8.3)

## 2011-11-16 LAB — CBC
HCT: 36.1 % — ABNORMAL LOW (ref 39.0–52.0)
Hemoglobin: 12.3 g/dL — ABNORMAL LOW (ref 13.0–17.0)
MCV: 89.4 fL (ref 78.0–100.0)
RBC: 4.04 MIL/uL — ABNORMAL LOW (ref 4.22–5.81)
WBC: 8.2 10*3/uL (ref 4.0–10.5)

## 2011-11-16 LAB — BASIC METABOLIC PANEL
BUN: 9 mg/dL (ref 6–23)
CO2: 26 mEq/L (ref 19–32)
Chloride: 96 mEq/L (ref 96–112)
GFR calc Af Amer: >90 mL/min (ref >90)
Glucose, Bld: 178 mg/dL — ABNORMAL HIGH (ref 70–99)
Potassium: 3.6 mEq/L (ref 3.5–5.1)

## 2011-11-16 LAB — D-DIMER, QUANTITATIVE: D-Dimer, Quant: 2.45 ug/mL-FEU — ABNORMAL HIGH (ref 0.00–0.48)

## 2011-11-16 MED ORDER — ONDANSETRON HCL 4 MG/2ML IJ SOLN: 4.0000 mg | Freq: Four times a day (QID) | INTRAMUSCULAR | Status: DC | PRN

## 2011-11-16 MED ORDER — ONDANSETRON HCL 4 MG/2ML IJ SOLN: 4.0000 mg | Freq: Three times a day (TID) | INTRAMUSCULAR | Status: DC | PRN

## 2011-11-16 MED ORDER — IOHEXOL 350 MG/ML SOLN
100.0000 mL | Freq: Once | INTRAVENOUS | Status: AC | PRN
  Administered 2011-11-16: 100 mL via INTRAVENOUS

## 2011-11-16 MED ORDER — ASPIRIN EC 325 MG PO TBEC
325.0000 mg | DELAYED_RELEASE_TABLET | Freq: Every day | ORAL | Status: DC
  Administered 2011-11-16 – 2011-11-18 (×3): 325 mg via ORAL
  Filled 2011-11-16 (×3): qty 1

## 2011-11-16 MED ORDER — ONDANSETRON HCL 4 MG PO TABS: 4.0000 mg | ORAL_TABLET | Freq: Four times a day (QID) | ORAL | Status: DC | PRN

## 2011-11-16 MED ORDER — DEXTROSE 5 % IV SOLN
500.0000 mg | Freq: Once | INTRAVENOUS | Status: AC
  Administered 2011-11-16: 500 mg via INTRAVENOUS
  Filled 2011-11-16: qty 500

## 2011-11-16 MED ORDER — DEXTROSE 5 % IV SOLN
1.0000 g | Freq: Once | INTRAVENOUS | Status: AC
  Administered 2011-11-16: 1 g via INTRAVENOUS
  Filled 2011-11-16: qty 10

## 2011-11-16 MED ORDER — SODIUM CHLORIDE 0.9 % IV SOLN: INTRAVENOUS | Status: AC

## 2011-11-16 MED ORDER — ENOXAPARIN SODIUM 40 MG/0.4ML ~~LOC~~ SOLN
40.0000 mg | SUBCUTANEOUS | Status: DC
  Administered 2011-11-16 – 2011-11-17 (×2): 40 mg via SUBCUTANEOUS
  Filled 2011-11-16 (×3): qty 0.4

## 2011-11-16 MED ORDER — SODIUM CHLORIDE 0.9 % IJ SOLN
3.0000 mL | Freq: Two times a day (BID) | INTRAMUSCULAR | Status: DC
  Administered 2011-11-16 – 2011-11-18 (×4): 3 mL via INTRAVENOUS

## 2011-11-16 MED ORDER — ATENOLOL 50 MG PO TABS
50.0000 mg | ORAL_TABLET | Freq: Every day | ORAL | Status: DC
  Administered 2011-11-16 – 2011-11-18 (×3): 50 mg via ORAL
  Filled 2011-11-16 (×3): qty 1

## 2011-11-16 MED ORDER — SODIUM CHLORIDE 0.9 % IV SOLN
INTRAVENOUS | Status: DC
  Administered 2011-11-16 – 2011-11-17 (×4): via INTRAVENOUS

## 2011-11-16 NOTE — ED Notes (Signed)
Patient sleeping at this time.

## 2011-11-16 NOTE — Progress Notes (Signed)
   CARE MANAGEMENT NOTE 11/16/2011  Patient:  Norman Mendez, Norman Mendez   Account Number:  [id number]  Date Initiated:  11/16/2011  Documentation initiated by:  Onnie Boer  Subjective/Objective Assessment:   PT WAS ADMITTED WITH CP     Action/Plan:   PROGRESSION OF CARE AND DISCHARGE PLANNING   Anticipated DC Date:  11/17/2011   Anticipated DC Plan:  HOME/SELF CARE      DC Planning Services  CM consult      Choice offered to / List presented to:             Status of service:  In process, will continue to follow Medicare Important Message given?   (If response is "NO", the following Medicare IM given date fields will be blank) Date Medicare IM given:   Date Additional Medicare IM given:    Discharge Disposition:    Per UR Regulation:    If discussed at Long Length of Stay Meetings, dates discussed:    Comments:  11/16/11  Onnie Boer, RN, BSN 1609 SPOKE WITH THE PT AND HIS DAUGHTER AND VERIFIED THAT THE PT IS FROM HOME WITH SELF CARE.  HE LIVES WITH HIS DAUGHTER. PT HAS NO NEEDS AT THIS TIME.  WILL F/U .

## 2011-11-16 NOTE — Evaluation (Signed)
Physical Therapy Evaluation Patient Details Name: Norman Mendez MRN: [id number] DOB: 12-Oct-1945 Today's Date: 11/16/2011  Problem List:  Patient Active Problem List  Diagnoses  . Chest pain  . Sinus tachycardia  . Hiccup  . Dizziness - light-headed  . Hyponatremia  . Tobacco use    Past Medical History: History reviewed. No pertinent past medical history. Past Surgical History: History reviewed. No pertinent past surgical history.  PT Assessment/Plan/Recommendation PT Assessment Clinical Impression Statement: Pt admitted with CP and tachycardia with recent fall. Pt states recent fall due to dizziness is the only one he has had and he isn't concerned about his balance. Due to berg balance test and gait speed pt a significant fall risk and educated for recommendation of  OPPT for balance. Will follow acutely to maximize mobility and gait as well as balance before discharge to decrease fall risk.  PT Recommendation/Assessment: Patient will need skilled PT in the acute care venue PT Problem List: Decreased balance Barriers to Discharge: None PT Therapy Diagnosis : Abnormality of gait PT Plan PT Frequency: Min 3X/week PT Treatment/Interventions: Gait training;Balance training;Therapeutic activities;Functional mobility training;Patient/family education PT Recommendation Follow Up Recommendations: Outpatient PT for balance training Equipment Recommended: None recommended by PT PT Goals  Acute Rehab PT Goals PT Goal Formulation: With patient Time For Goal Achievement: 7 days Pt will Ambulate: >150 feet;with modified independence;Other (comment) (500' for community ambulation) PT Goal: Ambulate - Progress: Goal set today Additional Goals Additional Goal #1: Pt will score >50 on berg balance test to decrease fall risk  PT Goal: Additional Goal #1 - Progress: Goal set today  PT Evaluation Precautions/Restrictions  Precautions Precautions: Fall Prior Functioning  Home  Living Lives With: Daughter Receives Help From: Family Type of Home: Apartment Home Layout: One level Home Access: Elevator Bathroom Shower/Tub: Tub/shower unit;Curtain Bathroom Toilet: Standard Home Adaptive Equipment: None Prior Function Level of Independence: Independent with basic ADLs;Independent with transfers;Independent with gait;Needs assistance with homemaking Meal Prep: Minimal Light Housekeeping: Minimal Driving: No Vocation: Retired Comments: Pt states he fixes breakfast but dgtr prepares all other meals and they share housework Cognition Cognition Arousal/Alertness: Awake/alert Orientation Level: Oriented X4 Sensation/Coordination Sensation Light Touch: Not tested Extremity Assessment RLE Assessment RLE Assessment: Within Functional Limits LLE Assessment LLE Assessment: Within Functional Limits Mobility (including Balance) Bed Mobility Bed Mobility: Yes Supine to Sit: 7: Independent Sit to Supine: 7: Independent Transfers Transfers: Yes Sit to Stand: 7: Independent Stand to Sit: 7: Independent Stand Pivot Transfers: 7: Independent Ambulation/Gait Ambulation/Gait: Yes Ambulation/Gait Assistance: 5: Supervision Ambulation/Gait Assistance Details (indicate cue type and reason): supervision for safety due to fall risk Ambulation Distance (Feet): 350 Feet Assistive device: None Gait Pattern: Within Functional Limits Gait velocity: 30/17= 1.76 ft/sec which places as a recurrent fall risk Stairs: No  Berg Balance Test Sit to Stand: Able to stand without using hands and stabilize independently Standing Unsupported: Able to stand safely 2 minutes Sitting with Back Unsupported but Feet Supported on Floor or Stool: Able to sit safely and securely 2 minutes Stand to Sit: Sits safely with minimal use of hands Transfers: Able to transfer safely, minor use of hands Standing Unsupported with Eyes Closed: Needs help to keep from falling Standing Ubsupported with Feet  Together: Able to place feet together independently and stand for 1 minute with supervision From Standing, Reach Forward with Outstretched Arm: Can reach confidently >25 cm (10") From Standing Position, Pick up Object from Floor: Able to pick up shoe safely and easily From Standing Position,  Turn to Look Behind Over each Shoulder: Looks behind one side only/other side shows less weight shift Turn 360 Degrees: Able to turn 360 degrees safely one side only in 4 seconds or less Standing Unsupported, Alternately Place Feet on Step/Stool: Able to stand independently and complete 8 steps >20 seconds Standing Unsupported, One Foot in Front: Needs help to step but can hold 15 seconds Standing on One Leg: Tries to lift leg/unable to hold 3 seconds but remains standing independently Total Score: 42  Exercise    End of Session PT - End of Session Activity Tolerance: Patient tolerated treatment well Patient left: in bed;with call bell in reach General Behavior During Session: [facility] for tasks performed Cognition: [facility] for tasks performed  Delorse Lek 11/16/2011, 5:01 PM  Delaney Meigs, PT [phone]

## 2011-11-16 NOTE — H&P (Signed)
PCP: None   Chief Complaint: Chest pain and palpitations   HPI: Norman Mendez is an 66 y.o. male with benign past medical history on no chronic medication, but perhaps by virtue of hardly ever saw a physician, was seen a few days ago in emergency room for hiccups, given Thorazine, and discharge home. At that time it was noted that he had sinus tachycardia. He returned to the emergency room tonight feeling chest pain, without nausea, vomiting, shortness of breath or diaphoresis. Evaluation in emergency room revealed sinus tachycardia at 140, with negative cardiac markers, d-dimer of 2.4, with subsequent CT pulmonary angiogram negative for PE. He was given beta blocker, and his heart rate returned to normal at 90. Hospitalist was asked to admit him for cardiac rule out, and to monitor his rhythm. Please note that a CT of the chest did show question of an air space disease with abnormal lymph nodes, requiring followup. He has no fever, chills, shortness of breath or cough.  Rewiew of Systems:  The patient denies anorexia, fever, weight loss,, vision loss, decreased hearing, hoarseness,  syncope, dyspnea on exertion, peripheral edema, balance deficits, hemoptysis, abdominal pain, melena, hematochezia, severe indigestion/heartburn, hematuria, incontinence, genital sores, muscle weakness, suspicious skin lesions, transient blindness, difficulty walking, depression, unusual weight change, abnormal bleeding, enlarged lymph nodes, angioedema, and breast masses.    History reviewed. No pertinent past medical history.  History reviewed. No pertinent past surgical history.  Medications:  HOME MEDS: Prior to Admission medications   Medication Sig Start Date End Date Taking? Authorizing Provider  chlorproMAZINE (THORAZINE) 50 MG tablet Take 1 tablet (50 mg total) by mouth 3 (three) times daily. 11/12/11 12/12/11 Yes Geoffery Lyons, MD     Allergies:  No Known Allergies  Social History:   reports that  he has been smoking Cigarettes.  He does not have any smokeless tobacco history on file. He reports that he drinks alcohol. He reports that he does not use illicit drugs.  Family History: No family history on file.   Physical Exam: Filed Vitals:   11/16/11 0130 11/16/11 0200 11/16/11 0230 11/16/11 0300  BP: 96/63 110/70 103/69 118/75  Pulse: 90 86 82 81  Temp:    98.7 F (37.1 C)  TempSrc:    Oral  Resp: 22 19 20 20   SpO2: 98% 97% 98% 99%   Blood pressure 118/75, pulse 81, temperature 98.7 F (37.1 C), temperature source Oral, resp. rate 20, SpO2 99.00%.  GEN:  Pleasant  person lying in the stretcher in no acute distress; cooperative with exam PSYCH:  alert and oriented x4; does not appear anxious does not appear depressed; affect is normal HEENT: Mucous membranes pink and anicteric; PERRLA; EOM intact; no cervical lymphadenopathy nor thyromegaly or carotid bruit; no JVD; Breasts:: Not examined CHEST WALL: No tenderness CHEST: Normal respiration, clear to auscultation bilaterally HEART: Regular rate and rhythm; no murmurs rubs or gallops BACK: No kyphosis or scoliosis; no CVA tenderness ABDOMEN: Obese, soft non-tender; no masses, no organomegaly, normal abdominal bowel sounds; no pannus; no intertriginous candida. Rectal Exam: Not done EXTREMITIES: No bone or joint deformity; age-appropriate arthropathy of the hands and knees; no edema; no ulcerations. Genitalia: not examined PULSES: 2+ and symmetric SKIN: Normal hydration no rash or ulceration CNS: Cranial nerves 2-12 grossly intact no focal neurologic deficit   Labs & Imaging Results for orders placed during the hospital encounter of 11/15/11 (from the past 48 hour(s))  CBC     Status: Normal   Collection  Time   11/15/11  9:59 PM      Component Value Range Comment   WBC 9.1  4.0 - 10.5 (K/uL)    RBC 4.67  4.22 - 5.81 (MIL/uL)    Hemoglobin 14.4  13.0 - 17.0 (g/dL)    HCT 40.9  81.1 - 91.4 (%)    MCV 87.8  78.0 - 100.0  (fL)    MCH 30.8  26.0 - 34.0 (pg)    MCHC 35.1  30.0 - 36.0 (g/dL)    RDW 78.2  95.6 - 21.3 (%)    Platelets 356  150 - 400 (K/uL)   DIFFERENTIAL     Status: Normal   Collection Time   11/15/11  9:59 PM      Component Value Range Comment   Neutrophils Relative 66  43 - 77 (%)    Neutro Abs 6.0  1.7 - 7.7 (K/uL)    Lymphocytes Relative 23  12 - 46 (%)    Lymphs Abs 2.1  0.7 - 4.0 (K/uL)    Monocytes Relative 10  3 - 12 (%)    Monocytes Absolute 1.0  0.1 - 1.0 (K/uL)    Eosinophils Relative 0  0 - 5 (%)    Eosinophils Absolute 0.0  0.0 - 0.7 (K/uL)    Basophils Relative 1  0 - 1 (%)    Basophils Absolute 0.1  0.0 - 0.1 (K/uL)   BASIC METABOLIC PANEL     Status: Abnormal   Collection Time   11/15/11  9:59 PM      Component Value Range Comment   Sodium 130 (*) 135 - 145 (mEq/L)    Potassium 3.6  3.5 - 5.1 (mEq/L)    Chloride 90 (*) 96 - 112 (mEq/L)    CO2 26  19 - 32 (mEq/L)    Glucose, Bld 169 (*) 70 - 99 (mg/dL)    BUN 10  6 - 23 (mg/dL)    Creatinine, Ser 0.86  0.50 - 1.35 (mg/dL)    Calcium 9.0  8.4 - 10.5 (mg/dL)    GFR calc non Af Amer 59 (*) >90 (mL/min)    GFR calc Af Amer 69 (*) >90 (mL/min)   GLUCOSE, CAPILLARY     Status: Abnormal   Collection Time   11/15/11 10:10 PM      Component Value Range Comment   Glucose-Capillary 184 (*) 70 - 99 (mg/dL)    Comment 1 Notify RN      Comment 2 Documented in Chart     POCT I-STAT TROPONIN I     Status: Normal   Collection Time   11/15/11 10:14 PM      Component Value Range Comment   Troponin i, poc 0.01  0.00 - 0.08 (ng/mL)    Comment 3            PRO B NATRIURETIC PEPTIDE     Status: Normal   Collection Time   11/15/11 11:08 PM      Component Value Range Comment   Pro B Natriuretic peptide (BNP) 114.1  0 - 125 (pg/mL)   LACTIC ACID, PLASMA     Status: Normal   Collection Time   11/15/11 11:09 PM      Component Value Range Comment   Lactic Acid, Venous 1.5  0.5 - 2.2 (mmol/L)   D-DIMER, QUANTITATIVE     Status: Abnormal     Collection Time   11/15/11 11:14 PM      Component Value Range  Comment   D-Dimer, Quant 2.45 (*) 0.00 - 0.48 (ug/mL-FEU)    Dg Chest 2 View  11/15/2011  *RADIOLOGY REPORT*  Clinical Data: Chest pain and weakness  CHEST - 2 VIEW  Comparison: 11/12/2011  Findings: Heart size is normal.  The lung volumes are low.  There is asymmetric elevation of the left hemi diaphragm.  Blunting of the left costophrenic angle is unchanged from previous exam.  Mild plate-like atelectasis is noted in the lung bases.  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  IMPRESSION:  1.  Interval decrease in lung volumes.  Original Report Authenticated By: Rosealee Albee, M.D.   Ct Angio Chest W/cm &/or Wo Cm  11/16/2011  *RADIOLOGY REPORT*  Clinical Data: Evaluate for pulmonary embolus  CT ANGIOGRAPHY CHEST  Technique:  Multidetector CT imaging of the chest using the standard protocol during bolus administration of intravenous contrast. Multiplanar reconstructed images including MIPs were obtained and reviewed to evaluate the vascular anatomy.  Contrast: OMNIPAQUE IOHEXOL 350 MG/ML IV SOLN  Comparison: None available  Findings: No abnormal filling defects identified within the main pulmonary artery or their branches to suggest an acute embolus.  No axillary or supraclavicular adenopathy.  The enlarged right hilar lymph nodes are identified.  For example, image 127 lymph node measures 1.3 cm.  The right infrahilar lymph node measures 1.30 cm, image 148.  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  There is airspace consolidation and ground-glass attenuation involving the right lower lobe.  Review of the visualized osseous structures shows no aggressive bone lesions.  Limited imaging through the upper abdomen is unremarkable.  IMPRESSION:  1.   No acute pulmonary embolus. 2.  Airspace consolidation and ground-glass attenuation in the right lower lobe.  In the acute setting this most likely represents an  infectious pneumonia.  This will need radiographic followup to ensure resolution and to rule out underlying malignancy. 2.  Multiple enlarged right hilar lymph nodes.  Indeterminate. Differential considerations include lymphoma, metastatic adenopathy or reactive adenopathy.  Careful clinical correlation is advised.  Original Report Authenticated By: Rosealee Albee, M.D.      Assessment Present on Admission:  .SVT (supraventricular tachycardia) .Chest pain .Sinus tachycardia .Hiccup Abnormal CT of the chest   PLAN: Will admit him to telemetry, rule out with serial CPKs and troponins. He will need an eventual stress test to a certain that he does not have any fixed coronary lesion (chest pain in the setting of tachycardia). I will check a TSH,, start him on an aspirin a day along with Tenormin. I don't think he has pneumonia clinically, and will try to avoid unnecessary antibiotic. He is stable, full code, and will be admitted to triad hospitalist service. It is imperative that he has followup for his abnormal CT scan of the chest.   Other plans as per orders.    Alyus Mofield 11/16/2011, 4:49 AM

## 2011-11-16 NOTE — Progress Notes (Signed)
  Echocardiogram 2D Echocardiogram has been performed.  Norman Mendez [facility] 11/16/2011, 10:07 AM

## 2011-11-16 NOTE — ED Notes (Signed)
Daughter: Yanixan Mellinger  [phone] cell phone.

## 2011-11-16 NOTE — ED Notes (Signed)
Returned from CT scan.

## 2011-11-16 NOTE — Progress Notes (Signed)
Utilization Review Completed.Norman Mendez T3/21/2013   

## 2011-11-16 NOTE — Progress Notes (Signed)
Patient was admitted this morning with multiple complaints. Dr. Marigene Ehlers note reviewed. His problems are as follows based on our discussion.  #1: Dizziness Patient's states that he has been dizzy for the past week. With hiccups. He fell once at home;  after the fall he developed chest pain. -Will get CT scan of the head without contrast. -Check orthostatic vital signs -2-D echo pending, carotid Doppler pending. -PT consulted  #2: Elevated d-dimer/abnormal CT finding. Patient had CT of the chest secondary to elevated d-dimer. CT of the chest showed question of infiltrate and lymphadenopathy. He has no fever no white count no cough. Clinical  he does not seem to have pneumonia. Patient was empirically started on Rocephin and Zithromax. Will continue those antibiotic. Would recommend repeated CT of the chest to see if there is improvement with therapy.  If not he may need a lymph node biopsy.  #3: Chest pain. Patient is being evaluated for chest pain. This could be secondary to CT findings. Cardiac markers are negative thus far. Patient to have outpatient stress tests if inpatient workup negative.  #4: SVT Patient is on beta blocker and aspirin.  #5: Tobacco use Patient states that he smokes only occasionally. Encourage smoking cessation.  Hyponatremia: Will continue IV fluids at 75 cc an hour. Will recheck sodium in the morning.  Hiccups: Resolved.

## 2011-11-17 LAB — BASIC METABOLIC PANEL
BUN: 9 mg/dL (ref 6–23)
CO2: 22 mEq/L (ref 19–32)
Calcium: 8 mg/dL — ABNORMAL LOW (ref 8.4–10.5)
Glucose, Bld: 94 mg/dL (ref 70–99)
Sodium: 135 mEq/L (ref 135–145)

## 2011-11-17 LAB — CBC
HCT: 36.2 % — ABNORMAL LOW (ref 39.0–52.0)
Hemoglobin: 12.4 g/dL — ABNORMAL LOW (ref 13.0–17.0)
MCH: 30.6 pg (ref 26.0–34.0)
RBC: 4.05 MIL/uL — ABNORMAL LOW (ref 4.22–5.81)

## 2011-11-17 MED ORDER — HYDRALAZINE HCL 20 MG/ML IJ SOLN
5.0000 mg | Freq: Once | INTRAMUSCULAR | Status: AC
  Administered 2011-11-17: 5 mg via INTRAVENOUS
  Filled 2011-11-17: qty 0.25

## 2011-11-17 MED ORDER — ASPIRIN 81 MG PO TBEC: 81.0000 mg | DELAYED_RELEASE_TABLET | Freq: Every day | ORAL | Status: AC

## 2011-11-17 MED ORDER — LEVOFLOXACIN 500 MG PO TABS: 500.0000 mg | ORAL_TABLET | Freq: Every day | ORAL | Status: DC

## 2011-11-17 MED ORDER — ATENOLOL 50 MG PO TABS: 50.0000 mg | ORAL_TABLET | Freq: Every day | ORAL | Status: DC

## 2011-11-17 MED ORDER — LEVOFLOXACIN 750 MG PO TABS
750.0000 mg | ORAL_TABLET | Freq: Every day | ORAL | Status: DC
  Administered 2011-11-17 – 2011-11-18 (×2): 750 mg via ORAL
  Filled 2011-11-17 (×2): qty 1

## 2011-11-17 MED ORDER — IBUPROFEN 200 MG PO TABS: 200.0000 mg | ORAL_TABLET | Freq: Once | ORAL | Status: DC

## 2011-11-17 MED ORDER — IBUPROFEN 200 MG PO TABS
200.0000 mg | ORAL_TABLET | Freq: Once | ORAL | Status: AC
  Administered 2011-11-17: 200 mg via ORAL
  Filled 2011-11-17: qty 1

## 2011-11-17 NOTE — Discharge Summary (Signed)
DISCHARGE SUMMARY  Norman Mendez  MR#: [id number]  DOB:Oct 08, 1945  Date of Admission: 11/15/2011 Date of Discharge: 11/17/2011  Attending Physician:Terel Bann JARRETT  Patient's PCP:No primary provider on file.  Consults: -None Discharge Diagnoses: .Sinus tachycardia .Hiccup .Dizziness - light-headed .Hyponatremia .Tobacco use/alcohol use  .Transaminitis .Abnormal CT chest . Question of pneumonia    Initial presentation: Norman Mendez is an 66 y.o. male with benign past medical history on no chronic medication, but perhaps by virtue of hardly ever saw a physician, was seen a few days ago in emergency room for hiccups, given Thorazine, and discharge home. At that time it was noted that he had sinus tachycardia. He returned to the emergency room tonight stating that after he took with Thorazine he went to stand up and felt very lightheaded and fell. He said that he had no chest pain no shortness of breath he only felt dizzy. It was mentioned in the present hospital admission note of chest pain but I questioned patient several times about chest pain and palpitations and shortness of breath and he denies these symptoms   Evaluation in emergency room revealed sinus tachycardia at 140, with negative cardiac markers, d-dimer of 2.4, with subsequent CT pulmonary angiogram negative for PE. He was given beta blocker, and his heart rate returned to normal at 90. Hospitalist was asked to admit him for cardiac rule out, and to monitor his rhythm. Please note that a CT of the chest did show question of an air space disease with abnormal lymph nodes, requiring followup.  he states that he does have a dry cough and MAXIMUM TEMPERATURE here was 100.3.     Hospital Course:  .Sinus tachycardia The patient was noted to have sinus tachycardia in the emergency room. Unsure of the etiology of this.  2-D echo did not show any wall motion abnormality. He had a stress test done in 2003 that was  negative for acute injury. He states that he did not have any chest pain or shortness of breath or palpitations. Will continue the Atenolol that was started since his blood pressure is slightly elevated. He can followup with PCP for further workup if he continues to be tachycardic. Outpatient the atenolol can be stopped if his blood pressure normalizes and then he can be followed for normalization of his heart rate.  .Hiccup Patient initial presentation was secondary to hiccups. His hiccups have improved. He was given Thorazine after which he had the dizzy spell. Will not prescribe any more Thorazine. His hiccups are probably secondary to his pulmonary process.   .Dizziness - light-headed Patient stated that he developed dizziness and lightheadedness after he took the Thorazine. He feels fine at this time. His head CT is normal. Orthostatics vitals pending.  Most likely the dizziness was in response to the Thorazine. Patient was evaluated by physical therapy and is referred to outpatient physical therapy for balance training. I suspect patient probably has some nerve damage from alcohol use. B12 and folate acid levels can be checked when he follows up outpatient.    .Hyponatremia Patient initially presented with hyponatremia. Hyponatremia most likely secondary to volume status. He was hydrated and his sodium is normal.  .Tobacco use/alcohol use  Encouraged patient on smoking cessation and quitting alcohol as he does have transaminitis indicating alcohol use.   .Transaminitis Patient does have elevated liver function with the splits indicating alcohol use. Encouraged alcohol cessation. Will check abdominal ultrasound. He has no abdominal pain or any gastric symptoms.  .Abnormal  CT chest Patient does have abnormal CT that shows infiltrate and lymphadenopathy. Discussed this with patient. Will treat with Levaquin for one week then patient should followup for repeat CAT scan of the chest within 2-4  weeks. This was discussed with the patient. And patient will be scheduled for ollow up appointment with outpatient physician.  Question of pneumonia Patient does have a low-grade temperature and infiltrate seen on CAT scan. He will be treated with Levaquin for 7 days as mentioned before then CT scan of the chest will be repeated.  Medication List  As of 11/17/2011  9:51 AM   STOP taking these medications         chlorproMAZINE 50 MG tablet         TAKE these medications         aspirin 81 MG EC tablet   Take 1 tablet (81 mg total) by mouth daily. Swallow whole.      atenolol 50 MG tablet   Commonly known as: TENORMIN   Take 1 tablet (50 mg total) by mouth daily.      levofloxacin 500 MG tablet   Commonly known as: LEVAQUIN   Take 1 tablet (500 mg total) by mouth daily.             Day of Discharge BP 152/86  Pulse 84  Temp(Src) 97.3 F (36.3 C) (Oral)  Resp 18  SpO2 100%  Physical Exam: General: Patient appears his stated age. HEENT: Head normocephalic atraumatic. Cardiovascular: Regular rate rhythm. Lungs: Decreased breath sounds at the bases. Abdomen: Positive bowel sounds. Extremities: No edema   Results for orders placed during the hospital encounter of 11/15/11 (from the past 24 hour(s))  CARDIAC PANEL(CRET KIN+CKTOT+MB+TROPI)     Status: Normal   Collection Time   11/16/11 11:58 AM      Component Value Range   Total CK 120  7 - 232 (U/L)   CK, MB 0.7  0.3 - 4.0 (ng/mL)   Troponin I <0.30  <0.30 (ng/mL)   Relative Index 0.6  0.0 - 2.5   HEPATIC FUNCTION PANEL     Status: Abnormal   Collection Time   11/16/11 11:58 AM      Component Value Range   Total Protein 6.7  6.0 - 8.3 (g/dL)   Albumin 2.0 (*) 3.5 - 5.2 (g/dL)   AST 259 (*) 0 - 37 (U/L)   ALT 69 (*) 0 - 53 (U/L)   Alkaline Phosphatase 103  39 - 117 (U/L)   Total Bilirubin 0.3  0.3 - 1.2 (mg/dL)   Bilirubin, Direct <5.6  0.0 - 0.3 (mg/dL)   Indirect Bilirubin NOT CALCULATED  0.3 - 0.9 (mg/dL)    CARDIAC PANEL(CRET KIN+CKTOT+MB+TROPI)     Status: Normal   Collection Time   11/16/11  8:14 PM      Component Value Range   Total CK 127  7 - 232 (U/L)   CK, MB 1.1  0.3 - 4.0 (ng/mL)   Troponin I <0.30  <0.30 (ng/mL)   Relative Index 0.9  0.0 - 2.5   CBC     Status: Abnormal   Collection Time   11/17/11  5:05 AM      Component Value Range   WBC 7.6  4.0 - 10.5 (K/uL)   RBC 4.05 (*) 4.22 - 5.81 (MIL/uL)   Hemoglobin 12.4 (*) 13.0 - 17.0 (g/dL)   HCT 38.7 (*) 56.4 - 52.0 (%)   MCV 89.4  78.0 - 100.0 (fL)  MCH 30.6  26.0 - 34.0 (pg)   MCHC 34.3  30.0 - 36.0 (g/dL)   RDW 62.9  52.8 - 41.3 (%)   Platelets 329  150 - 400 (K/uL)  BASIC METABOLIC PANEL     Status: Abnormal   Collection Time   11/17/11  5:05 AM      Component Value Range   Sodium 135  135 - 145 (mEq/L)   Potassium 3.9  3.5 - 5.1 (mEq/L)   Chloride 101  96 - 112 (mEq/L)   CO2 22  19 - 32 (mEq/L)   Glucose, Bld 94  70 - 99 (mg/dL)   BUN 9  6 - 23 (mg/dL)   Creatinine, Ser 2.44  0.50 - 1.35 (mg/dL)   Calcium 8.0 (*) 8.4 - 10.5 (mg/dL)   GFR calc non Af Amer 86 (*) >90 (mL/min)   GFR calc Af Amer >90  >90 (mL/min)    Disposition:Home   Follow-up Appts: Discharge Orders    Future Orders Please Complete By Expires   Ambulatory referral to Physical Therapy      Discharge instructions      Comments:   Please repeat CT scan of  The chest in 2-4 weeks to evaluate for resolution of infiltrate after treatment. No alcohol or Tobacco        Tests Needing Follow-up: Patient to followup for CT of the chest within 4 weeks to evaluate for resolution of infiltrate post treatment with antibiotic..   Time spent in discharge (includes decision making & examination of pt): 50 minutes  Signed: Khristie Sak JARRETT 11/17/2011, 9:51 AM

## 2011-11-17 NOTE — Progress Notes (Signed)
   CARE MANAGEMENT NOTE 11/17/2011  Patient:  Norman Mendez, Norman Mendez   Account Number:  [id number]  Date Initiated:  11/16/2011  Documentation initiated by:  Onnie Boer  Subjective/Objective Assessment:   PT WAS ADMITTED WITH CP     Action/Plan:   PROGRESSION OF CARE AND DISCHARGE PLANNING   Anticipated DC Date:  11/17/2011   Anticipated DC Plan:  HOME/SELF CARE      DC Planning Services  CM consult  Medication Assistance  [facility]      Choice offered to / List presented to:             Status of service:  Completed, signed off Medicare Important Message given?   (If response is "NO", the following Medicare IM given date fields will be blank) Date Medicare IM given:   Date Additional Medicare IM given:    Discharge Disposition:  HOME/SELF CARE  Per UR Regulation:    If discussed at Long Length of Stay Meetings, dates discussed:    Comments:  11/17/11 Letha Cape RN, BSN [phone] patient states he can not afford outpt pt, also patient informed to go to [facility] for hospital f/u as a walk in on Tues, [place] or Friday between 1-3, they will be closed on Good Friday.  I also called outpt clinic to see if we can get an appt for hosp f/u, left a message for a return call, have not heard back yet.  Sent scripts down to main pharmacy for med ast.  11/16/11  Onnie Boer, RN, BSN [id number] SPOKE WITH THE PT AND HIS DAUGHTER AND VERIFIED THAT THE PT IS FROM HOME WITH SELF CARE.  HE LIVES WITH HIS DAUGHTER. PT HAS NO NEEDS AT THIS TIME.  WILL F/U .

## 2011-11-17 NOTE — Progress Notes (Signed)
Orthostatic vital signs in doc flow sheets. IV fluids stopped per order

## 2011-11-17 NOTE — Progress Notes (Signed)
Physical Therapy Treatment/Discharge Note Patient Details Name: Norman Mendez MRN: [id number] DOB: 07/27/1946 Today's Date: 11/17/2011  PT Assessment/Plan  PT - Assessment/Plan Comments on Treatment Session: The patient became increasingly aggitated as I asked him to perform balance exercises as recommended by the last PT to see him.  "There is nothing wrong with my balance!"  I explained to him the resuts of the balance test performed yesterday and that he should be able to do the exercise we tried today without his hands and for greater than 30 seconds.  "I do just fine at home."  "Why do you people keep bothering me?"  I asked the patient if he still wanted PT to follow him in the hospital and he stated "No!  I am fine, leave me alone!"  PT to sign off.  Please re-order if patient changes his mind prior to discharge.   PT Plan: Other (comment) (Patient does not want to see PT again.  ) Follow Up Recommendations: Other (comment) (patient refusing OP PT for balance training.  ) Equipment Recommended: None recommended by PT  PT Treatment Precautions/Restrictions  Precautions Precautions: Fall Restrictions Weight Bearing Restrictions: No Mobility (including Balance) Bed Mobility Supine to Sit: 7: Independent Sit to Supine: 7: Independent    Exercise  Other Exercises Other Exercises: tandem stand bil multiple attempts with one hand to obtain position < 15 second holds bil.   End of Session PT - End of Session Activity Tolerance: Treatment limited secondary to agitation Patient left: in bed;with call bell in reach;with bed alarm set  Jakara Blatter B. Julieth Tugman, PT, DPT [phone] 11/17/2011, 4:04 PM

## 2011-11-18 ENCOUNTER — Observation Stay (HOSPITAL_COMMUNITY): Payer: Medicare Other

## 2011-11-18 LAB — COMPREHENSIVE METABOLIC PANEL
CO2: 24 mEq/L (ref 19–32)
Calcium: 8.7 mg/dL (ref 8.4–10.5)
Creatinine, Ser: 0.87 mg/dL (ref 0.50–1.35)
GFR calc Af Amer: >90 mL/min (ref >90)
GFR calc non Af Amer: 89 mL/min — ABNORMAL LOW (ref >90)
Glucose, Bld: 104 mg/dL — ABNORMAL HIGH (ref 70–99)

## 2011-11-18 LAB — CBC
Hemoglobin: 13.5 g/dL (ref 13.0–17.0)
MCH: 29.8 pg (ref 26.0–34.0)
MCHC: 33.3 g/dL (ref 30.0–36.0)
MCV: 89.4 fL (ref 78.0–100.0)
Platelets: 371 10*3/uL (ref 150–400)
RBC: 4.53 MIL/uL (ref 4.22–5.81)

## 2011-11-18 MED ORDER — ATENOLOL 50 MG PO TABS: 50.0000 mg | ORAL_TABLET | Freq: Every day | ORAL | Status: DC

## 2011-11-18 MED ORDER — LEVOFLOXACIN 750 MG PO TABS: 750.0000 mg | ORAL_TABLET | Freq: Every day | ORAL | Status: DC

## 2011-11-18 MED ORDER — LEVOFLOXACIN 750 MG PO TABS: 750.0000 mg | ORAL_TABLET | Freq: Every day | ORAL | Status: AC

## 2011-11-18 NOTE — Progress Notes (Signed)
Discharge instructions and outpatient pharmacy medications given to pt. Pt was given 6 day supply of levaquin and 7 day supply of atenolol. Removed IV, no bleeding noted. Pt tolerated well. Pt ready for discharge.

## 2011-11-18 NOTE — Discharge Summary (Signed)
Addendum: Discharge Summary  Norman Mendez MR#: [id number]  DOB:[birth year]  Date of Admission: 11/15/2011 Date of Discharge: 11/18/2011  Patient's PCP: No primary provider on file., patient was instructed to followup with [facility]  Attending Physician:Abbrielle Batts A  Consults: None  Discharge Diagnoses: Active Problems:  Chest pain  Sinus tachycardia  Hiccup  Dizziness - light-headed  Hyponatremia  Tobacco use  Transaminitis   Brief Admitting History and Physical 66 year old male with no significant past medical history presented on 11/16/2011 with palpitation.  Discharge Medications Medication List  As of 11/18/2011 12:02 PM   STOP taking these medications         chlorproMAZINE 50 MG tablet         TAKE these medications         aspirin 81 MG EC tablet   Take 1 tablet (81 mg total) by mouth daily. Swallow whole.      atenolol 50 MG tablet   Commonly known as: TENORMIN   Take 1 tablet (50 mg total) by mouth daily.      levofloxacin 750 MG tablet   Commonly known as: LEVAQUIN   Take 1 tablet (750 mg total) by mouth daily.            Hospital Course: Please refer to discharge summary by Dr. Earlene Plater on 11/17/2011 for more details.  Sinus tachycardia  Resolved.  No events noted on telemetry.  The patient was noted to have sinus tachycardia in the emergency room.  Patient had a 2-D echocardiogram done during this admission on March 21 of 2013, which showed abnormal septal motion, cavity size was normal, ejection fraction was 55%, wall motion was normal, there were no regional wall motion abnormalities. He had a stress test done in 2003 that was negative for acute injury. He states that he did not have any chest pain or shortness of breath or palpitations. Continue Atenolol which was started since his blood pressure is slightly elevated. He can followup with PCP for further workup if he continues to be tachycardic.  As outpatient, atenolol can be stopped if his  blood pressure normalizes and then he can be followed for normalization of his heart rate.   Hiccup  Patient initial presentation was secondary to hiccups. His hiccups have improved. He was given Thorazine after which he had the dizzy spell. Will not prescribe any more Thorazine. His hiccups are probably secondary to his pulmonary process.   Dizziness - light-headed  Patient stated that he developed dizziness and lightheadedness after he took the Thorazine. He feels fine at this time. His head CT is normal. [facility] vitals on 11/17/2011 which showed the patient was orthostatic. Most likely the dizziness was in response to the Thorazine. Patient was evaluated by physical therapy, patient refuses to see physical therapy and occupational therapy for balance training. Suspect patient probably has some nerve damage from alcohol use. B12 and folate acid levels can be checked when he follows up outpatient.   Hyponatremia  Patient initially presented with hyponatremia.  Resolved.    Tobacco use/alcohol use  Encouraged patient on smoking cessation and quitting alcohol as he does have transaminitis indicating alcohol use.   Transaminitis  Patient does have elevated liver function with the AST/ALT ratio indicating alcohol use. Encouraged alcohol cessation, patient reports that he has not had any alcohol for 6 months.  Abdominal ultrasound performed on 11/18/2011 was negative.  Consider sending hepatitis panel as outpatient.    Abnormal CT chest  Patient does have abnormal CT  that shows infiltrate and lymphadenopathy. Discussed this with patient. Will treat with Levaquin for one week then patient should followup for repeat CT scan of the chest within 2-4 weeks. This was discussed with the patient. And patient will be scheduled for ollow up appointment with outpatient physician.   Question of pneumonia  Patient does have a low-grade temperature and infiltrate seen on CT scan. He will be treated with  Levaquin for 7 days as mentioned before then CT scan of the chest will be repeated.  Day of Discharge BP 149/99  Pulse 76  Temp(Src) 98.1 F (36.7 C) (Oral)  Resp 18  Ht 5\' 6"  (1.676 m)  Wt 76.4 kg (168 lb 6.9 oz)  BMI 27.19 kg/m2  SpO2 98%  Results for orders placed during the hospital encounter of 11/15/11 (from the past 48 hour(s))  CARDIAC PANEL(CRET KIN+CKTOT+MB+TROPI)     Status: Normal   Collection Time   11/16/11  8:14 PM      Component Value Range Comment   Total CK 127  7 - 232 (U/L)    CK, MB 1.1  0.3 - 4.0 (ng/mL)    Troponin I <0.30  <0.30 (ng/mL)    Relative Index 0.9  0.0 - 2.5    CBC     Status: Abnormal   Collection Time   11/17/11  5:05 AM      Component Value Range Comment   WBC 7.6  4.0 - 10.5 (K/uL)    RBC 4.05 (*) 4.22 - 5.81 (MIL/uL)    Hemoglobin 12.4 (*) 13.0 - 17.0 (g/dL)    HCT 16.1 (*) 09.6 - 52.0 (%)    MCV 89.4  78.0 - 100.0 (fL)    MCH 30.6  26.0 - 34.0 (pg)    MCHC 34.3  30.0 - 36.0 (g/dL)    RDW 04.5  40.9 - 81.1 (%)    Platelets 329  150 - 400 (K/uL)   BASIC METABOLIC PANEL     Status: Abnormal   Collection Time   11/17/11  5:05 AM      Component Value Range Comment   Sodium 135  135 - 145 (mEq/L)    Potassium 3.9  3.5 - 5.1 (mEq/L) SLIGHT HEMOLYSIS   Chloride 101  96 - 112 (mEq/L)    CO2 22  19 - 32 (mEq/L)    Glucose, Bld 94  70 - 99 (mg/dL)    BUN 9  6 - 23 (mg/dL)    Creatinine, Ser 9.14  0.50 - 1.35 (mg/dL)    Calcium 8.0 (*) 8.4 - 10.5 (mg/dL)    GFR calc non Af Amer 86 (*) >90 (mL/min)    GFR calc Af Amer >90  >90 (mL/min)   CBC     Status: Normal   Collection Time   11/18/11  5:00 AM      Component Value Range Comment   WBC 6.8  4.0 - 10.5 (K/uL)    RBC 4.53  4.22 - 5.81 (MIL/uL)    Hemoglobin 13.5  13.0 - 17.0 (g/dL)    HCT 78.2  95.6 - 21.3 (%)    MCV 89.4  78.0 - 100.0 (fL)    MCH 29.8  26.0 - 34.0 (pg)    MCHC 33.3  30.0 - 36.0 (g/dL)    RDW 08.6  57.8 - 46.9 (%)    Platelets 371  150 - 400 (K/uL)   COMPREHENSIVE  METABOLIC PANEL     Status: Abnormal   Collection Time  11/18/11  5:00 AM      Component Value Range Comment   Sodium 137  135 - 145 (mEq/L)    Potassium 4.0  3.5 - 5.1 (mEq/L)    Chloride 103  96 - 112 (mEq/L)    CO2 24  19 - 32 (mEq/L)    Glucose, Bld 104 (*) 70 - 99 (mg/dL)    BUN 9  6 - 23 (mg/dL)    Creatinine, Ser 4.09  0.50 - 1.35 (mg/dL)    Calcium 8.7  8.4 - 10.5 (mg/dL)    Total Protein 6.8  6.0 - 8.3 (g/dL)    Albumin 2.0 (*) 3.5 - 5.2 (g/dL)    AST 55 (*) 0 - 37 (U/L)    ALT 54 (*) 0 - 53 (U/L)    Alkaline Phosphatase 102  39 - 117 (U/L)    Total Bilirubin 0.4  0.3 - 1.2 (mg/dL)    GFR calc non Af Amer 89 (*) >90 (mL/min)    GFR calc Af Amer >90  >90 (mL/min)     Dg Chest 2 View  11/15/2011  *RADIOLOGY REPORT*  Clinical Data: Chest pain and weakness  CHEST - 2 VIEW  Comparison: 11/12/2011  Findings: Heart size is normal.  The lung volumes are low.  There is asymmetric elevation of the left hemi diaphragm.  Blunting of the left costophrenic angle is unchanged from previous exam.  Mild plate-like atelectasis is noted in the lung bases.  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  IMPRESSION:  1.  Interval decrease in lung volumes.  Original Report Authenticated By: Rosealee Albee, M.D.   Dg Chest 2 View  11/12/2011  *RADIOLOGY REPORT*  Clinical Data: Hiccups  CHEST - 2 VIEW  Comparison: Report 01/07/2002 no images available  Findings: Cardiomediastinal silhouette is unremarkable.  There is elevation of the left hemidiaphragm.  Probable chronic blunting of the left costophrenic angle.  No focal infiltrate or pulmonary edema.  Mild degenerative changes thoracic spine.  IMPRESSION:  There is elevation of the left hemidiaphragm.  Probable chronic blunting of the left costophrenic angle.  No focal infiltrate or pulmonary edema.  Mild degenerative changes thoracic spine.  Original Report Authenticated By: Natasha Mead, M.D.   Ct Head Wo Contrast  11/16/2011  *RADIOLOGY  REPORT*  Clinical Data: 66 year old male with dizziness and falls.  CT HEAD WITHOUT CONTRAST  Technique:  Contiguous axial images were obtained from the base of the skull through the vertex without contrast.  Comparison: None.  Findings: Visualized paranasal sinuses and mastoids are clear.  No acute osseous abnormality identified.  Degenerative changes partially visible in the upper cervical spine. Visualized orbit soft tissues are within normal limits.  No acute scalp soft tissue finding. Calcified atherosclerosis at the skull base.  Dystrophic basal ganglia calcifications.  Small dystrophic calcification near the right frontal horn.  Superimposed periventricular white matter hypodensity.  Prominent hypodensity near the anterior limb of the left internal capsule and basal ganglia (series 2 image 16). No midline shift, mass effect, or evidence of mass lesion.  No ventriculomegaly. No acute intracranial hemorrhage identified.  No evidence of cortically based acute infarction identified.  No suspicious intracranial vascular hyperdensity.  IMPRESSION: 1.  Chronic small vessel disease suspected.  Involvement at the left basal ganglia/anterior limb internal capsule is age indeterminate. 2.  Dystrophic calcifications in the brain, likely inconsequential.  Original Report Authenticated By: Harley Hallmark, M.D.   Ct Angio Chest W/cm &/or Wo Cm  11/16/2011  *  RADIOLOGY REPORT*  Clinical Data: Evaluate for pulmonary embolus  CT ANGIOGRAPHY CHEST  Technique:  Multidetector CT imaging of the chest using the standard protocol during bolus administration of intravenous contrast. Multiplanar reconstructed images including MIPs were obtained and reviewed to evaluate the vascular anatomy.  Contrast: OMNIPAQUE IOHEXOL 350 MG/ML IV SOLN  Comparison: None available  Findings: No abnormal filling defects identified within the main pulmonary artery or their branches to suggest an acute embolus.  No axillary or supraclavicular  adenopathy.  The enlarged right hilar lymph nodes are identified.  For example, image 127 lymph node measures 1.3 cm.  The right infrahilar lymph node measures 1.30 cm, image 148.  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  There is airspace consolidation and ground-glass attenuation involving the right lower lobe.  Review of the visualized osseous structures shows no aggressive bone lesions.  Limited imaging through the upper abdomen is unremarkable.  IMPRESSION:  1.   No acute pulmonary embolus. 2.  Airspace consolidation and ground-glass attenuation in the right lower lobe.  In the acute setting this most likely represents an infectious pneumonia.  This will need radiographic followup to ensure resolution and to rule out underlying malignancy. 2.  Multiple enlarged right hilar lymph nodes.  Indeterminate. Differential considerations include lymphoma, metastatic adenopathy or reactive adenopathy.  Careful clinical correlation is advised.  Original Report Authenticated By: Rosealee Albee, M.D.   US Abdomen Complete  11/18/2011  *RADIOLOGY REPORT*  Clinical Data:  Elevated liver function tests.  COMPLETE ABDOMINAL ULTRASOUND  Comparison:  None.  Findings:  Gallbladder:  Normal, without wall thickening, stone, or pericholecystic fluid.  Sonographic Murphy's sign was not elicited.  Common bile duct: Normal, 3 mm.  Liver: Normal in echogenicity, without focal lesion.  IVC: Negative  Pancreas:  Not visualized due to overlying bowel gas.  Spleen:  Normal in size and echogenicity.  Right Kidney:  11.7 cm. No hydronephrosis.  Left Kidney:  11.0 cm. No hydronephrosis.  Abdominal aorta:  Partially obscured distally.  No aneurysm or ascites.  IMPRESSION: No acute process or explanation for elevated liver function tests. Portions of anatomy obscured by bowel gas  Original Report Authenticated By: Consuello Bossier, M.D.   Disposition: Home with followup as indicated below  Diet: Heart healthy diet  Activity:  Resume as tolerated.   Follow-up Appts: Discharge Orders    Future Orders Please Complete By Expires   Ambulatory referral to Physical Therapy      Diet - low sodium heart healthy      Discharge instructions      Comments:   Please repeat CT scan of  The chest in 2-4 weeks to evaluate for resolution of infiltrate after treatment. No alcohol or Tobacco   Increase activity slowly      Discharge instructions      Comments:   Followup with [facility] in 1 week.      TESTS THAT NEED FOLLOW-UP None  Time spent on discharge, talking to the patient, and coordinating care: 25 mins.   Signed: Cristal Ford, MD 11/18/2011, 12:02 PM

## 2011-11-18 NOTE — Progress Notes (Signed)
Subjective: No events noted on tele. Wondering when he can be discharged.  Objective: Vital signs in last 24 hours: Filed Vitals:   11/17/11 1010 11/17/11 1400 11/17/11 2006 11/18/11 0432  BP: 130/81 161/90 173/98 149/99  Pulse: 102 88 87 76  Temp:  98.5 F (36.9 C) 100.4 F (38 C) 98.1 F (36.7 C)  TempSrc:  Oral Oral Oral  Resp:  18 18 18   Height: 5\' 6"  (1.676 m)     Weight: 76.4 kg (168 lb 6.9 oz)     SpO2:  96% 96% 98%   Weight change:   Intake/Output Summary (Last 24 hours) at 11/18/11 1144 Last data filed at 11/18/11 0700  Gross per 24 hour  Intake    243 ml  Output   1050 ml  Net   -807 ml    Physical Exam: General: Awake, Oriented, No acute distress. HEENT: EOMI. Neck: Supple CV: S1 and S2 Lungs: Clear to ascultation bilaterally Abdomen: Soft, Nontender, Nondistended, +bowel sounds. Ext: Good pulses. Trace edema.  Lab Results:  [facility] 11/18/11 0500 11/17/11 0505  NA 137 135  K 4.0 3.9  CL 103 101  CO2 24 22  GLUCOSE 104* 94  BUN 9 9  CREATININE 0.87 0.93  CALCIUM 8.7 8.0*  MG -- --  PHOS -- --    Basename 11/18/11 0500 11/16/11 1158  AST 55* 104*  ALT 54* 69*  ALKPHOS 102 103  BILITOT 0.4 0.3  PROT 6.8 6.7  ALBUMIN 2.0* 2.0*   No results found for this basename: LIPASE:2,AMYLASE:2 in the last 72 hours  Basename 11/18/11 0500 11/17/11 0505 11/15/11 2159  WBC 6.8 7.6 --  NEUTROABS -- -- 6.0  HGB 13.5 12.4* --  HCT 40.5 36.2* --  MCV 89.4 89.4 --  PLT 371 329 --    Basename 11/16/11 2014 11/16/11 1158 11/16/11 0510  CKTOTAL 127 120 124  CKMB 1.1 0.7 0.8  CKMBINDEX -- -- --  TROPONINI <0.30 <0.30 <0.30   No components found with this basename: POCBNP:3  Basename 11/15/11 2314  DDIMER 2.45*   No results found for this basename: HGBA1C:2 in the last 72 hours No results found for this basename: CHOL:2,HDL:2,LDLCALC:2,TRIG:2,CHOLHDL:2,LDLDIRECT:2 in the last 72 hours  Basename 11/16/11 0510  TSH 1.198  T4TOTAL --  T3FREE --    THYROIDAB --   No results found for this basename: VITAMINB12:2,FOLATE:2,FERRITIN:2,TIBC:2,IRON:2,RETICCTPCT:2 in the last 72 hours  Micro Results: No results found for this or any previous visit (from the past 240 hour(s)).  Studies/Results: Ct Head Wo Contrast  11/16/2011  *RADIOLOGY REPORT*  Clinical Data: 66 year old male with dizziness and falls.  CT HEAD WITHOUT CONTRAST  Technique:  Contiguous axial images were obtained from the base of the skull through the vertex without contrast.  Comparison: None.  Findings: Visualized paranasal sinuses and mastoids are clear.  No acute osseous abnormality identified.  Degenerative changes partially visible in the upper cervical spine. Visualized orbit soft tissues are within normal limits.  No acute scalp soft tissue finding. Calcified atherosclerosis at the skull base.  Dystrophic basal ganglia calcifications.  Small dystrophic calcification near the right frontal horn.  Superimposed periventricular white matter hypodensity.  Prominent hypodensity near the anterior limb of the left internal capsule and basal ganglia (series 2 image 16). No midline shift, mass effect, or evidence of mass lesion.  No ventriculomegaly. No acute intracranial hemorrhage identified.  No evidence of cortically based acute infarction identified.  No suspicious intracranial vascular hyperdensity.  IMPRESSION: 1.  Chronic  small vessel disease suspected.  Involvement at the left basal ganglia/anterior limb internal capsule is age indeterminate. 2.  Dystrophic calcifications in the brain, likely inconsequential.  Original Report Authenticated By: Harley Hallmark, M.D.   US Abdomen Complete  11/18/2011  *RADIOLOGY REPORT*  Clinical Data:  Elevated liver function tests.  COMPLETE ABDOMINAL ULTRASOUND  Comparison:  None.  Findings:  Gallbladder:  Normal, without wall thickening, stone, or pericholecystic fluid.  Sonographic Murphy's sign was not elicited.  Common bile duct: Normal, 3 mm.   Liver: Normal in echogenicity, without focal lesion.  IVC: Negative  Pancreas:  Not visualized due to overlying bowel gas.  Spleen:  Normal in size and echogenicity.  Right Kidney:  11.7 cm. No hydronephrosis.  Left Kidney:  11.0 cm. No hydronephrosis.  Abdominal aorta:  Partially obscured distally.  No aneurysm or ascites.  IMPRESSION: No acute process or explanation for elevated liver function tests. Portions of anatomy obscured by bowel gas  Original Report Authenticated By: Consuello Bossier, M.D.    Medications: I have reviewed the patient's current medications. Scheduled Meds:   . aspirin EC  325 mg Oral Daily  . atenolol  50 mg Oral Daily  . enoxaparin  40 mg Subcutaneous Q24H  . hydrALAZINE  5 mg Intravenous Once  . ibuprofen  200 mg Oral Once  . levofloxacin  750 mg Oral Daily  . sodium chloride  3 mL Intravenous Q12H  . DISCONTD: ibuprofen  200 mg Oral Once   Continuous Infusions:  PRN Meds:.nitroGLYCERIN, ondansetron (ZOFRAN) IV, ondansetron  Assessment/Plan: Sinus tachycardia  Resolved.  No events noted on telemetry.  The patient was noted to have sinus tachycardia in the emergency room.  Patient had a 2-D echocardiogram done during this admission on March 21 of 2013, which showed abnormal septal motion, cavity size was normal, ejection fraction was 55%, wall motion was normal, there were no regional wall motion abnormalities. He had a stress test done in 2003 that was negative for acute injury. He states that he did not have any chest pain or shortness of breath or palpitations. Continue Atenolol which was started since his blood pressure is slightly elevated. He can followup with PCP for further workup if he continues to be tachycardic.  As outpatient, atenolol can be stopped if his blood pressure normalizes and then he can be followed for normalization of his heart rate.   Hiccup  Patient initial presentation was secondary to hiccups. His hiccups have improved. He was given  Thorazine after which he had the dizzy spell. Will not prescribe any more Thorazine. His hiccups are probably secondary to his pulmonary process.   Dizziness - light-headed  Patient stated that he developed dizziness and lightheadedness after he took the Thorazine. He feels fine at this time. His head CT is normal. [facility] vitals on 11/17/2011 which showed the patient was orthostatic. Most likely the dizziness was in response to the Thorazine. Patient was evaluated by physical therapy, patient refuses to see physical therapy and occupational therapy for balance training. Suspect patient probably has some nerve damage from alcohol use. B12 and folate acid levels can be checked when he follows up outpatient.   Hyponatremia  Patient initially presented with hyponatremia.  Resolved.    Tobacco use/alcohol use  Encouraged patient on smoking cessation and quitting alcohol as he does have transaminitis indicating alcohol use.   Transaminitis  Patient does have elevated liver function with the splits indicating alcohol use. Encouraged alcohol cessation, patient reports that he  has not had any alcohol for 6 months.  Abdominal ultrasound performed today was negative.  Consider sending hepatitis panel as outpatient.    Abnormal CT chest  Patient does have abnormal CT that shows infiltrate and lymphadenopathy. Discussed this with patient. Will treat with Levaquin for one week then patient should followup for repeat CAT scan of the chest within 2-4 weeks. This was discussed with the patient. And patient will be scheduled for ollow up appointment with outpatient physician.   Question of pneumonia  Patient does have a low-grade temperature and infiltrate seen on CAT scan. He will be treated with Levaquin for 7 days as mentioned before then CT scan of the chest will be repeated.  Disposition Discharge the patient today.   LOS: 3 days  Tsuneo Faison A, MD 11/18/2011, 11:44 AM

## 2013-08-08 ENCOUNTER — Encounter (HOSPITAL_COMMUNITY): Payer: Self-pay | Admitting: Emergency Medicine

## 2013-08-08 ENCOUNTER — Emergency Department (HOSPITAL_COMMUNITY): Payer: Medicare Other

## 2013-08-08 ENCOUNTER — Emergency Department (HOSPITAL_COMMUNITY)
Admission: EM | Admit: 2013-08-08 | Discharge: 2013-08-08 | Disposition: A | Discharge status: Home / Self Care | Payer: Medicare Other | Attending: Emergency Medicine | Admitting: Emergency Medicine

## 2013-08-08 DIAGNOSIS — F17200 Nicotine dependence, unspecified, uncomplicated: Secondary | ICD-10-CM | POA: Insufficient documentation

## 2013-08-08 DIAGNOSIS — R0602 Shortness of breath: Secondary | ICD-10-CM | POA: Insufficient documentation

## 2013-08-08 DIAGNOSIS — R05 Cough: Secondary | ICD-10-CM

## 2013-08-08 DIAGNOSIS — R059 Cough, unspecified: Secondary | ICD-10-CM | POA: Insufficient documentation

## 2013-08-08 DIAGNOSIS — Z79899 Other long term (current) drug therapy: Secondary | ICD-10-CM | POA: Insufficient documentation

## 2013-08-08 DIAGNOSIS — I10 Essential (primary) hypertension: Secondary | ICD-10-CM | POA: Insufficient documentation

## 2013-08-08 HISTORY — DX: Essential (primary) hypertension: I10

## 2013-08-08 LAB — BASIC METABOLIC PANEL
CO2: 26 mEq/L (ref 19–32)
Calcium: 8.8 mg/dL (ref 8.4–10.5)
Chloride: 109 mEq/L (ref 96–112)
Creatinine, Ser: 0.89 mg/dL (ref 0.50–1.35)
GFR calc Af Amer: >90 mL/min (ref >90)
GFR calc non Af Amer: 87 mL/min — ABNORMAL LOW (ref >90)
Sodium: 144 mEq/L (ref 135–145)

## 2013-08-08 LAB — CBC WITH DIFFERENTIAL/PLATELET
Basophils Absolute: 0 10*3/uL (ref 0.0–0.1)
Eosinophils Absolute: 0.2 10*3/uL (ref 0.0–0.7)
Eosinophils Relative: 3 % (ref 0–5)
Hemoglobin: 14.2 g/dL (ref 13.0–17.0)
Lymphocytes Relative: 40 % (ref 12–46)
Lymphs Abs: 2.6 10*3/uL (ref 0.7–4.0)
MCH: 30.2 pg (ref 26.0–34.0)
MCV: 89.4 fL (ref 78.0–100.0)
Neutrophils Relative %: 49 % (ref 43–77)
Platelets: 250 10*3/uL (ref 150–400)
RBC: 4.7 MIL/uL (ref 4.22–5.81)
RDW: 14.7 % (ref 11.5–15.5)
WBC: 6.5 10*3/uL (ref 4.0–10.5)

## 2013-08-08 LAB — TROPONIN I: Troponin I: <0.3 ng/mL (ref <0.30)

## 2013-08-08 LAB — D-DIMER, QUANTITATIVE: D-Dimer, Quant: <0.27 ug/mL-FEU (ref 0.00–0.48)

## 2013-08-08 MED ORDER — DOXYCYCLINE HYCLATE 100 MG PO TABS: 100.0000 mg | ORAL_TABLET | Freq: Two times a day (BID) | ORAL | Status: DC

## 2013-08-08 MED ORDER — ALBUTEROL SULFATE (5 MG/ML) 0.5% IN NEBU
5.0000 mg | INHALATION_SOLUTION | Freq: Once | RESPIRATORY_TRACT | Status: AC
  Administered 2013-08-08: 5 mg via RESPIRATORY_TRACT
  Filled 2013-08-08: qty 1

## 2013-08-08 MED ORDER — ALBUTEROL SULFATE HFA 108 (90 BASE) MCG/ACT IN AERS: 2.0000 | INHALATION_SPRAY | RESPIRATORY_TRACT | Status: DC | PRN

## 2013-08-08 MED ORDER — IPRATROPIUM BROMIDE 0.02 % IN SOLN
0.5000 mg | Freq: Once | RESPIRATORY_TRACT | Status: AC
  Administered 2013-08-08: 0.5 mg via RESPIRATORY_TRACT
  Filled 2013-08-08: qty 2.5

## 2013-08-08 NOTE — ED Notes (Signed)
Phlebotomy at bedside.

## 2013-08-08 NOTE — ED Notes (Signed)
Pt ambulated to the bathroom stating at 100% RA with a heart rate of 96bpm - 104bpm

## 2013-08-08 NOTE — ED Provider Notes (Signed)
CSN: [id number]     Arrival date & time 08/08/13  1224 History   First MD Initiated Contact with Patient 08/08/13 1236     Chief Complaint  Patient presents with  . Shortness of Breath    HPI Pt was seen at 1250. Per pt, c/o gradual onset and persistence of constant cough for the past 2 weeks. Describes the cough as productive of "yellow" sputum. States he was sitting on his couch PTA and "felt SOB." EMS applied O2 N/C on their arrival to scene with improvement in his symptoms. Pt continues to smoke cigarettes. Pt also endorses "drinking a 40" today PTA. Denies CP/palpitations, no abd pain, no N/V/D, no back pain, no fevers.      Past Medical History  Diagnosis Date  . Hypertension    History reviewed. No pertinent past surgical history.  History  Substance Use Topics  . Smoking status: Current Every Day Smoker -- 0.20 packs/day    Types: Cigarettes  . Smokeless tobacco: Not on file  . Alcohol Use: 1.2 oz/week    2 Cans of beer per week    Review of Systems ROS: Statement: All systems negative except as marked or noted in the HPI; Constitutional: Negative for fever and chills. ; ; Eyes: Negative for eye pain, redness and discharge. ; ; ENMT: Negative for ear pain, hoarseness, nasal congestion, sinus pressure and sore throat. ; ; Cardiovascular: Negative for chest pain, palpitations, diaphoresis, and peripheral edema. ; ; Respiratory: +cough, SOB. Negative for wheezing and stridor. ; ; Gastrointestinal: Negative for nausea, vomiting, diarrhea, abdominal pain, blood in stool, hematemesis, jaundice and rectal bleeding. . ; ; Genitourinary: Negative for dysuria, flank pain and hematuria. ; ; Musculoskeletal: Negative for back pain and neck pain. Negative for swelling and trauma.; ; Skin: Negative for pruritus, rash, abrasions, blisters, bruising and skin lesion.; ; Neuro: Negative for headache, lightheadedness and neck stiffness. Negative for weakness, altered level of consciousness ,  altered mental status, extremity weakness, paresthesias, involuntary movement, seizure and syncope.      Allergies  Review of patient's allergies indicates no known allergies.  Home Medications   Current Outpatient Rx  Name  Route  Sig  Dispense  Refill  . atenolol (TENORMIN) 50 MG tablet   Oral   Take 50 mg by mouth daily as needed (high blood pressure).         . EXPIRED: atenolol (TENORMIN) 50 MG tablet   Oral   Take 1 tablet (50 mg total) by mouth daily.   30 tablet   0    BP 146/87  Pulse 82  Temp(Src) 97.7 F (36.5 C) (Oral)  Resp 15  Ht 5\' 6"  (1.676 m)  Wt 160 lb (72.576 kg)  BMI 25.84 kg/m2  SpO2 95% Physical Exam 1255: Physical examination:  Nursing notes reviewed; Vital signs and O2 SAT reviewed;  Constitutional: Well developed, Well nourished, Well hydrated, In no acute distress; Head:  Normocephalic, atraumatic; Eyes: EOMI, PERRL, No scleral icterus; ENMT: TM's clear bilat. +edemetous nasal turbinates bilat with clear rhinorrhea. Mouth and pharynx normal, Mucous membranes moist; Neck: Supple, Full range of motion, No lymphadenopathy; Cardiovascular: Regular rate and rhythm, No gallop; Respiratory: Breath sounds coarse & equal bilaterally, No wheezes.  Speaking full sentences with ease, Normal respiratory effort/excursion; Chest: Nontender, Movement normal; Abdomen: Soft, Nontender, Nondistended, Normal bowel sounds; Genitourinary: No CVA tenderness; Extremities: Pulses normal, No tenderness, No edema, No calf edema or asymmetry.; Neuro: AA&Ox3, Major CN grossly intact.  Speech clear. Climbs  on and off stretcher easily by himself. Gait steady. No gross focal motor or sensory deficits in extremities.; Skin: Color normal, Warm, Dry.   ED Course  Procedures  EKG Interpretation    Date/Time:  Friday August 08 2013 12:35:41 EST Ventricular Rate:  92 PR Interval:  150 QRS Duration: 73 QT Interval:  358 QTC Calculation: 443 R Axis:   33 Text Interpretation:   Sinus rhythm Nonspecific T wave abnormality Baseline wander Confirmed by [facility]  MD, Nicholos Johns [phone]) on 08/08/2013 2:40:31 PM            MDM  MDM Reviewed: previous chart, nursing note and vitals Reviewed previous: labs and ECG Interpretation: labs, ECG and x-ray      Date: 08/08/2013 on arrival  Rate: 92   Rhythm: normal sinus rhythm  QRS Axis: normal  Intervals: normal  ST/T Wave abnormalities: nonspecific T wave changes  Conduction Disutrbances:none  Narrative Interpretation:   Old EKG Reviewed: changes noted; previous EKG dated 11/14/12 with sinus tachycardia.   Date: 08/08/2013 repeat  Rate: 87  Rhythm: normal sinus rhythm  QRS Axis: normal  Intervals: normal  ST/T Wave abnormalities: nonspecific T wave changes  Conduction Disutrbances:none  Narrative Interpretation:   Old EKG Reviewed: unchanged from previous EKG today  Results for orders placed during the hospital encounter of 08/08/13  BASIC METABOLIC PANEL      Result Value Range   Sodium 144  135 - 145 mEq/L   Potassium 3.7  3.5 - 5.1 mEq/L   Chloride 109  96 - 112 mEq/L   CO2 26  19 - 32 mEq/L   Glucose, Bld 79  70 - 99 mg/dL   BUN 6  6 - 23 mg/dL   Creatinine, Ser 7.82  0.50 - 1.35 mg/dL   Calcium 8.8  8.4 - 95.6 mg/dL   GFR calc non Af Amer 87 (*) >90 mL/min   GFR calc Af Amer >90  >90 mL/min  CBC WITH DIFFERENTIAL      Result Value Range   WBC 6.5  4.0 - 10.5 K/uL   RBC 4.70  4.22 - 5.81 MIL/uL   Hemoglobin 14.2  13.0 - 17.0 g/dL   HCT 21.3  08.6 - 57.8 %   MCV 89.4  78.0 - 100.0 fL   MCH 30.2  26.0 - 34.0 pg   MCHC 33.8  30.0 - 36.0 g/dL   RDW 46.9  62.9 - 52.8 %   Platelets 250  150 - 400 K/uL   Neutrophils Relative % 49  43 - 77 %   Neutro Abs 3.2  1.7 - 7.7 K/uL   Lymphocytes Relative 40  12 - 46 %   Lymphs Abs 2.6  0.7 - 4.0 K/uL   Monocytes Relative 8  3 - 12 %   Monocytes Absolute 0.5  0.1 - 1.0 K/uL   Eosinophils Relative 3  0 - 5 %   Eosinophils Absolute 0.2  0.0 - 0.7 K/uL    Basophils Relative 0  0 - 1 %   Basophils Absolute 0.0  0.0 - 0.1 K/uL  TROPONIN I      Result Value Range   Troponin I <0.30  <0.30 ng/mL  D-DIMER, QUANTITATIVE      Result Value Range   D-Dimer, Quant <0.27  0.00 - 0.48 ug/mL-FEU  PRO B NATRIURETIC PEPTIDE      Result Value Range   Pro B Natriuretic peptide (BNP) 22.5  0 - 125 pg/mL   Dg Chest  2 View 08/08/2013   CLINICAL DATA:  Shortness of breath.  EXAM: CHEST  2 VIEW  COMPARISON:  November 23, 2011.  FINDINGS: Cardiomediastinal silhouette appears normal. Hypoinflation of the lungs is noted. Stable blunting of left costophrenic sulcus is noted most consistent with scarring. No pneumothorax is noted. Right lung is clear. No acute abnormality is noted in the left lung. Bony thorax is intact.  IMPRESSION: Stable left basilar scarring. No acute cardiopulmonary abnormality seen.   Electronically Signed   By: Roque Lias M.D.   On: 08/08/2013 13:48    1530:  Pt states he "feels better" after neb treatment.  NAD, lungs CTA bilat, no wheezing, resps easy, speaking full sentences, Sats 97% R/A.  Pt ambulated around the ED with steady gait, easy resps, NAD and Sats remaining 100% R/A. Denies CP or SOB. Pt wants to go home now. Will tx cough symptomatically at this time. Dx and testing d/w pt.  Questions answered.  Verb understanding, agreeable to d/c home with outpt f/u.     Laray Anger, DO 08/11/13 [phone]

## 2013-08-08 NOTE — ED Notes (Signed)
Per EMS pt from home has had productive yellow-green tinged sputum x2 weeks. Had sudden episode of SOB appx 30 min while resting on cough.. 12-lead unremarkable sinus tach.

## 2013-08-08 NOTE — ED Notes (Signed)
Patient transported to X-ray 

## 2014-03-17 ENCOUNTER — Encounter (HOSPITAL_COMMUNITY): Payer: Self-pay | Admitting: Emergency Medicine

## 2014-03-17 ENCOUNTER — Inpatient Hospital Stay (HOSPITAL_COMMUNITY)
Admission: EM | Admit: 2014-03-17 | Discharge: 2014-03-20 | DRG: 282 | Disposition: A | Discharge status: Home / Self Care | Payer: Medicare Other | Attending: Cardiology | Admitting: Cardiology

## 2014-03-17 ENCOUNTER — Emergency Department (HOSPITAL_COMMUNITY): Payer: Medicare Other

## 2014-03-17 DIAGNOSIS — I10 Essential (primary) hypertension: Secondary | ICD-10-CM | POA: Diagnosis present

## 2014-03-17 DIAGNOSIS — I2510 Atherosclerotic heart disease of native coronary artery without angina pectoris: Secondary | ICD-10-CM | POA: Diagnosis present

## 2014-03-17 DIAGNOSIS — Z7982 Long term (current) use of aspirin: Secondary | ICD-10-CM | POA: Diagnosis not present

## 2014-03-17 DIAGNOSIS — D72829 Elevated white blood cell count, unspecified: Secondary | ICD-10-CM | POA: Diagnosis present

## 2014-03-17 DIAGNOSIS — Z720 Tobacco use: Secondary | ICD-10-CM

## 2014-03-17 DIAGNOSIS — Z79899 Other long term (current) drug therapy: Secondary | ICD-10-CM | POA: Diagnosis not present

## 2014-03-17 DIAGNOSIS — F17200 Nicotine dependence, unspecified, uncomplicated: Secondary | ICD-10-CM | POA: Diagnosis present

## 2014-03-17 DIAGNOSIS — I2582 Chronic total occlusion of coronary artery: Secondary | ICD-10-CM | POA: Diagnosis present

## 2014-03-17 DIAGNOSIS — E871 Hypo-osmolality and hyponatremia: Secondary | ICD-10-CM

## 2014-03-17 DIAGNOSIS — I214 Non-ST elevation (NSTEMI) myocardial infarction: Principal | ICD-10-CM

## 2014-03-17 DIAGNOSIS — E876 Hypokalemia: Secondary | ICD-10-CM | POA: Diagnosis present

## 2014-03-17 DIAGNOSIS — R740 Nonspecific elevation of levels of transaminase and lactic acid dehydrogenase [LDH]: Secondary | ICD-10-CM

## 2014-03-17 DIAGNOSIS — E785 Hyperlipidemia, unspecified: Secondary | ICD-10-CM | POA: Diagnosis present

## 2014-03-17 DIAGNOSIS — R000 Tachycardia, unspecified: Secondary | ICD-10-CM

## 2014-03-17 DIAGNOSIS — R7401 Elevation of levels of liver transaminase levels: Secondary | ICD-10-CM

## 2014-03-17 DIAGNOSIS — R079 Chest pain, unspecified: Secondary | ICD-10-CM | POA: Diagnosis present

## 2014-03-17 DIAGNOSIS — R066 Hiccough: Secondary | ICD-10-CM

## 2014-03-17 DIAGNOSIS — I161 Hypertensive emergency: Secondary | ICD-10-CM

## 2014-03-17 LAB — URINE MICROSCOPIC-ADD ON

## 2014-03-17 LAB — URINALYSIS, ROUTINE W REFLEX MICROSCOPIC
Bilirubin Urine: NEGATIVE
GLUCOSE, UA: NEGATIVE mg/dL
Ketones, ur: 15 mg/dL — AB
Nitrite: NEGATIVE
PH: 7.5 (ref 5.0–8.0)
Protein, ur: NEGATIVE mg/dL
SPECIFIC GRAVITY, URINE: 1.01 (ref 1.005–1.030)
Urobilinogen, UA: 0.2 mg/dL (ref 0.0–1.0)

## 2014-03-17 LAB — BASIC METABOLIC PANEL
Anion gap: 15 (ref 5–15)
BUN: 4 mg/dL — AB (ref 6–23)
CHLORIDE: 100 meq/L (ref 96–112)
CO2: 26 meq/L (ref 19–32)
CREATININE: 0.85 mg/dL (ref 0.50–1.35)
Calcium: 8.8 mg/dL (ref 8.4–10.5)
GFR calc non Af Amer: 88 mL/min — ABNORMAL LOW (ref >90)
GLUCOSE: 116 mg/dL — AB (ref 70–99)
Potassium: 3.4 mEq/L — ABNORMAL LOW (ref 3.7–5.3)
Sodium: 141 mEq/L (ref 137–147)

## 2014-03-17 LAB — CBC WITH DIFFERENTIAL/PLATELET
BASOS PCT: 0 % (ref 0–1)
Basophils Absolute: 0 10*3/uL (ref 0.0–0.1)
Eosinophils Absolute: 0.1 10*3/uL (ref 0.0–0.7)
Eosinophils Relative: 1 % (ref 0–5)
HEMATOCRIT: 40.6 % (ref 39.0–52.0)
HEMOGLOBIN: 13.7 g/dL (ref 13.0–17.0)
LYMPHS ABS: 2.1 10*3/uL (ref 0.7–4.0)
Lymphocytes Relative: 20 % (ref 12–46)
MCH: 29.9 pg (ref 26.0–34.0)
MCHC: 33.7 g/dL (ref 30.0–36.0)
MCV: 88.6 fL (ref 78.0–100.0)
MONO ABS: 0.5 10*3/uL (ref 0.1–1.0)
MONOS PCT: 5 % (ref 3–12)
NEUTROS ABS: 8.1 10*3/uL — AB (ref 1.7–7.7)
Neutrophils Relative %: 74 % (ref 43–77)
Platelets: 324 10*3/uL (ref 150–400)
RBC: 4.58 MIL/uL (ref 4.22–5.81)
RDW: 14.2 % (ref 11.5–15.5)
WBC: 10.8 10*3/uL — ABNORMAL HIGH (ref 4.0–10.5)

## 2014-03-17 LAB — TROPONIN I
Troponin I: 5.8 ng/mL (ref <0.30)
Troponin I: >20 ng/mL (ref <0.30)

## 2014-03-17 LAB — TSH: TSH: 2.06 u[IU]/mL (ref 0.350–4.500)

## 2014-03-17 LAB — MRSA PCR SCREENING: MRSA by PCR: NEGATIVE

## 2014-03-17 MED ORDER — SODIUM CHLORIDE 0.9 % IV SOLN: 1.0000 mL/kg/h | INTRAVENOUS | Status: DC

## 2014-03-17 MED ORDER — ACETAMINOPHEN 325 MG PO TABS: 650.0000 mg | ORAL_TABLET | ORAL | Status: DC | PRN

## 2014-03-17 MED ORDER — METOPROLOL TARTRATE 25 MG PO TABS
25.0000 mg | ORAL_TABLET | Freq: Two times a day (BID) | ORAL | Status: DC
  Administered 2014-03-17 – 2014-03-20 (×6): 25 mg via ORAL
  Filled 2014-03-17 (×7): qty 1

## 2014-03-17 MED ORDER — ZOLPIDEM TARTRATE 5 MG PO TABS: 5.0000 mg | ORAL_TABLET | Freq: Every evening | ORAL | Status: DC | PRN

## 2014-03-17 MED ORDER — METOPROLOL TARTRATE 1 MG/ML IV SOLN
5.0000 mg | Freq: Once | INTRAVENOUS | Status: AC
  Administered 2014-03-17: 5 mg via INTRAVENOUS
  Filled 2014-03-17: qty 5

## 2014-03-17 MED ORDER — ATENOLOL 50 MG PO TABS: 50.0000 mg | ORAL_TABLET | Freq: Every day | ORAL | Status: DC

## 2014-03-17 MED ORDER — HEPARIN BOLUS VIA INFUSION
4000.0000 [IU] | Freq: Once | INTRAVENOUS | Status: AC
  Administered 2014-03-17: 4000 [IU] via INTRAVENOUS
  Filled 2014-03-17: qty 4000

## 2014-03-17 MED ORDER — ASPIRIN 81 MG PO CHEW
81.0000 mg | CHEWABLE_TABLET | ORAL | Status: AC
  Administered 2014-03-18: 81 mg via ORAL
  Filled 2014-03-17: qty 1

## 2014-03-17 MED ORDER — SODIUM CHLORIDE 0.9 % IJ SOLN
3.0000 mL | Freq: Two times a day (BID) | INTRAMUSCULAR | Status: DC
  Administered 2014-03-18: 3 mL via INTRAVENOUS

## 2014-03-17 MED ORDER — ATORVASTATIN CALCIUM 80 MG PO TABS
80.0000 mg | ORAL_TABLET | Freq: Every day | ORAL | Status: DC
  Administered 2014-03-18 – 2014-03-19 (×2): 80 mg via ORAL
  Filled 2014-03-17 (×3): qty 1

## 2014-03-17 MED ORDER — METOPROLOL TARTRATE 25 MG PO TABS
25.0000 mg | ORAL_TABLET | Freq: Once | ORAL | Status: AC
Start: 2014-03-17 — End: 2014-03-17
  Administered 2014-03-17: 25 mg via ORAL
  Filled 2014-03-17: qty 1

## 2014-03-17 MED ORDER — NITROGLYCERIN IN D5W 200-5 MCG/ML-% IV SOLN: 3.0000 ug/min | INTRAVENOUS | Status: DC

## 2014-03-17 MED ORDER — SODIUM CHLORIDE 0.9 % IJ SOLN
3.0000 mL | Freq: Two times a day (BID) | INTRAMUSCULAR | Status: DC
  Administered 2014-03-17 – 2014-03-18 (×2): 3 mL via INTRAVENOUS

## 2014-03-17 MED ORDER — HEPARIN (PORCINE) IN NACL 100-0.45 UNIT/ML-% IJ SOLN
1000.0000 [IU]/h | INTRAMUSCULAR | Status: DC
  Administered 2014-03-17: 1000 [IU]/h via INTRAVENOUS
  Filled 2014-03-17 (×2): qty 250

## 2014-03-17 MED ORDER — NITROGLYCERIN 0.4 MG SL SUBL: 0.4000 mg | SUBLINGUAL_TABLET | SUBLINGUAL | Status: DC | PRN

## 2014-03-17 MED ORDER — SODIUM CHLORIDE 0.9 % IV SOLN: 250.0000 mL | INTRAVENOUS | Status: DC | PRN

## 2014-03-17 MED ORDER — SODIUM CHLORIDE 0.9 % IJ SOLN: 3.0000 mL | INTRAMUSCULAR | Status: DC | PRN

## 2014-03-17 MED ORDER — NITROGLYCERIN 0.4 MG SL SUBL
0.4000 mg | SUBLINGUAL_TABLET | SUBLINGUAL | Status: DC | PRN
  Administered 2014-03-17: 0.4 mg via SUBLINGUAL
  Filled 2014-03-17: qty 1

## 2014-03-17 MED ORDER — ALPRAZOLAM 0.25 MG PO TABS: 0.2500 mg | ORAL_TABLET | Freq: Two times a day (BID) | ORAL | Status: DC | PRN

## 2014-03-17 MED ORDER — SODIUM CHLORIDE 0.9 % IJ SOLN
3.0000 mL | INTRAMUSCULAR | Status: DC | PRN
Start: 2014-03-17 — End: 2014-03-18

## 2014-03-17 MED ORDER — NITROGLYCERIN IN D5W 200-5 MCG/ML-% IV SOLN
2.0000 ug/min | INTRAVENOUS | Status: DC
  Administered 2014-03-17: 10 ug/min via INTRAVENOUS
  Administered 2014-03-18: 30 ug/min via INTRAVENOUS
  Administered 2014-03-18: 60 ug/min via INTRAVENOUS
  Filled 2014-03-17 (×2): qty 250

## 2014-03-17 MED ORDER — POTASSIUM CHLORIDE CRYS ER 20 MEQ PO TBCR
40.0000 meq | EXTENDED_RELEASE_TABLET | Freq: Once | ORAL | Status: AC
  Administered 2014-03-17: 40 meq via ORAL
  Filled 2014-03-17: qty 2

## 2014-03-17 MED ORDER — ONDANSETRON HCL 4 MG/2ML IJ SOLN: 4.0000 mg | Freq: Four times a day (QID) | INTRAMUSCULAR | Status: DC | PRN

## 2014-03-17 MED ORDER — ASPIRIN 81 MG PO CHEW
324.0000 mg | CHEWABLE_TABLET | Freq: Once | ORAL | Status: AC
  Administered 2014-03-17: 324 mg via ORAL
  Filled 2014-03-17: qty 4

## 2014-03-17 NOTE — ED Notes (Signed)
Pt remains in x-ray at this time.

## 2014-03-17 NOTE — Progress Notes (Signed)
ANTICOAGULATION CONSULT NOTE - Initial Consult  Pharmacy Consult for Heparin Indication: chest pain/ACS  No Known Allergies  Patient Measurements: Height: 5\' 1"  (154.9 cm) Weight: 168 lb (76.204 kg) IBW/kg (Calculated) : 52.3 Heparin Dosing Weight: 68 kg  Vital Signs: Temp: 98.1 F (36.7 C) (07/21 1536) Temp src: Oral (07/21 1536) BP: 190/103 mmHg (07/21 1635) Pulse Rate: 89 (07/21 1536)  Labs:  Recent Labs  03/17/14 1552  HGB 13.7  HCT 40.6  PLT 324  CREATININE 0.85  TROPONINI 5.80*    Estimated Creatinine Clearance: 73.8 ml/min (by C-G formula based on Cr of 0.85).   Medical History: Past Medical History  Diagnosis Date  . Hypertension     Medications:  See electronic med rec  Assessment: 68 y.o. male presents with chest pain. Troponin elevated to 5.8. To begin heparin for ACS. CBC ok at baseline.  Goal of Therapy:  Heparin level 0.3-0.7 units/ml Monitor platelets by anticoagulation protocol: Yes   Plan:  1. Heparin IV bolus 4000 units 2. Heparin IV gtt at 1000 units/hr 3. Will f/u heparin level in 6 hours 4. Daily heparin level and CBC  Sherlon Handing, PharmD, BCPS Clinical pharmacist, pager [phone] 03/17/2014,5:31 PM

## 2014-03-17 NOTE — ED Notes (Signed)
Dr. Stanford Breed in to assess pt at this time

## 2014-03-17 NOTE — ED Provider Notes (Addendum)
CSN: [id number]     Arrival date & time 03/17/14  1513 History   First MD Initiated Contact with Patient 03/17/14 1514     Chief Complaint  Patient presents with  . Chest Pain     (Consider location/radiation/quality/duration/timing/severity/associated sxs/prior Treatment) HPI Comments: Pt with hx of HTN comes in with cc of chest pain. Pt started having chest pain y'day. Chest pain is located on the right and left anterior side, non radiating. Chest pain is described as nagging pain. Pain has no specific aggravating or relieving factors. Pt has no n/v/f/c/diophoresis. No dib. Pt's pain stopped on it's onw y'day, but it started again this am while he was laying, and hasnt gone away.  Patient is a 68 y.o. male presenting with chest pain. The history is provided by the patient.  Chest Pain Associated symptoms: no cough, no diaphoresis, no dizziness, no fever, no headache, no nausea and no shortness of breath     Past Medical History  Diagnosis Date  . Hypertension    History reviewed. No pertinent past surgical history. No family history on file. History  Substance Use Topics  . Smoking status: Current Every Day Smoker -- 0.20 packs/day    Types: Cigarettes  . Smokeless tobacco: Not on file  . Alcohol Use: 1.2 oz/week    2 Cans of beer per week    Review of Systems  Constitutional: Negative for fever, chills, diaphoresis and activity change.  Eyes: Negative for visual disturbance.  Respiratory: Negative for cough, chest tightness and shortness of breath.   Cardiovascular: Positive for chest pain. Negative for leg swelling.  Gastrointestinal: Negative for nausea and abdominal distention.  Genitourinary: Negative for dysuria, enuresis and difficulty urinating.  Musculoskeletal: Negative for arthralgias and neck pain.  Neurological: Negative for dizziness, light-headedness and headaches.  Psychiatric/Behavioral: Negative for confusion.      Allergies  Review of patient's  allergies indicates no known allergies.  Home Medications   Prior to Admission medications   Medication Sig Start Date End Date Taking? Authorizing Provider  albuterol (PROVENTIL HFA;VENTOLIN HFA) 108 (90 BASE) MCG/ACT inhaler Inhale 2 puffs into the lungs every 4 (four) hours as needed for wheezing or shortness of breath. 08/08/13   Alfonzo Feller, DO  atenolol (TENORMIN) 50 MG tablet Take 1 tablet (50 mg total) by mouth daily. 11/18/11 11/17/12  Bynum Bellows, MD  atenolol (TENORMIN) 50 MG tablet Take 50 mg by mouth daily as needed (high blood pressure).    Historical Provider, MD  doxycycline (VIBRA-TABS) 100 MG tablet Take 1 tablet (100 mg total) by mouth 2 (two) times daily. 08/08/13   Alfonzo Feller, DO   BP 190/103  Pulse 89  Temp(Src) 98.1 F (36.7 C) (Oral)  Resp 20  Ht 5\' 1"  (1.549 m)  Wt 168 lb (76.204 kg)  BMI 31.76 kg/m2  SpO2 97% Physical Exam  Nursing note and vitals reviewed. Constitutional: He is oriented to person, place, and time. He appears well-developed.  HENT:  Head: Normocephalic and atraumatic.  Eyes: Conjunctivae and EOM are normal. Pupils are equal, round, and reactive to light.  Neck: Normal range of motion. Neck supple.  Cardiovascular: Normal rate and regular rhythm.   Pulmonary/Chest: Effort normal and breath sounds normal. No respiratory distress. He has no wheezes.  Abdominal: Soft. Bowel sounds are normal. He exhibits no distension. There is no tenderness. There is no rebound and no guarding.  Neurological: He is alert and oriented to person, place, and time. No cranial  nerve deficit. Coordination normal.  Skin: Skin is warm.    ED Course  Procedures (including critical care time) Labs Review Labs Reviewed  CBC WITH DIFFERENTIAL - Abnormal; Notable for the following:    WBC 10.8 (*)    Neutro Abs 8.1 (*)    All other components within normal limits  BASIC METABOLIC PANEL - Abnormal; Notable for the following:    Potassium 3.4 (*)     Glucose, Bld 116 (*)    BUN 4 (*)    GFR calc non Af Amer 88 (*)    All other components within normal limits  TROPONIN I - Abnormal; Notable for the following:    Troponin I 5.80 (*)    All other components within normal limits  URINALYSIS, ROUTINE W REFLEX MICROSCOPIC - Abnormal; Notable for the following:    APPearance CLOUDY (*)    Hgb urine dipstick SMALL (*)    Ketones, ur 15 (*)    Leukocytes, UA SMALL (*)    All other components within normal limits  URINE MICROSCOPIC-ADD ON - Abnormal; Notable for the following:    Bacteria, UA MANY (*)    All other components within normal limits    Imaging Review Dg Chest 2 View  03/17/2014   CLINICAL DATA:  Chest pain.  EXAM: CHEST  2 VIEW  COMPARISON:  08/08/2013.  FINDINGS: The cardiac silhouette, mediastinal and hilar contours are normal and stable. 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 No acute pulmonary findings. The bony thorax is intact.  IMPRESSION: No acute cardiopulmonary findings.   Electronically Signed   By: Kalman Jewels M.D.   On: 03/17/2014 16:23     EKG Interpretation   Date/Time:  Tuesday March 17 2014 15:35:22 EDT Ventricular Rate:  90 PR Interval:  158 QRS Duration: 83 QT Interval:  375 QTC Calculation: 459 R Axis:   34 Text Interpretation:  Sinus rhythm Probable left atrial enlargement  Posterior infarct, old Abnormal T, consider ischemia, lateral leads new  lateral t wave inversion Confirmed by Kathrynn Humble, MD, Maybelline [place]ik [phone]) on  03/17/2014 3:45:31 PM       EKG Interpretation  Date/Time:  Tuesday March 17 2014 17:36:13 EDT Ventricular Rate:  80 PR Interval:  155 QRS Duration: 84 QT Interval:  401 QTC Calculation: 463 R Axis:   31 Text Interpretation:  Sinus rhythm Probable left atrial enlargement Posterior infarct, old Abnrm T, consider ischemia, anterolateral lds Confirmed by Kathrynn Humble, MD, Thelma Comp [phone]) on 03/17/2014 5:50:23 PM      Smoking cessation  instruction/counseling given:  counseled patient on the dangers of tobacco use, advised patient to stop smoking, and reviewed strategies to maximize success.  MDM   Final diagnoses:  Hypertensive emergency  NSTEMI (non-ST elevated myocardial infarction)    Pt comes in with  cc of chest pain.  Pt is noted to have elevated BP with MAP of 140. Admits to missing his medication today. He has no CAD hx, but is hypertensive, and smokes occasionally. Chest pain is atypical, however, we were planning on admitting him for a rule out, especially since the ekg had new lateral T wave inversions.  Troponin came in > 5. Pt informed. Chest pain present, and so nitro gtt started - to help with chest pain and attain goal map of 105. Pt was given oral metoprolol before, 25 mg, will give iv 5 mg right now. Cards have been consulted.   CRITICAL CARE Performed by: Varney Biles   Total  critical care time: 60 minutes  Critical care time was exclusive of separately billable procedures and treating other patients.  Critical care was necessary to treat or prevent imminent or life-threatening deterioration.  Critical care was time spent personally by me on the following activities: development of treatment plan with patient and/or surrogate as well as nursing, discussions with consultants, evaluation of patient's response to treatment, examination of patient, obtaining history from patient or surrogate, ordering and performing treatments and interventions, ordering and review of laboratory studies, ordering and review of radiographic studies, pulse oximetry and re-evaluation of patient's condition.    Varney Biles, MD 03/17/14 [place], MD 03/17/14 1610

## 2014-03-17 NOTE — H&P (Signed)
Primary cardiologist: [facility]  HPI: 68 year old male with no prior cardiac history for evaluation of non-ST elevation myocardial infarction. Patient had his first episode of chest pain yesterday morning. It was diffuse and described as indigestion. Not pleuritic, positional, related to food. The pain did not radiate. No associated symptoms. The pain resolved after 30 minutes. He developed recurrent symptoms At 11 AM today. The pain was more severe and continued. He has presented to the emergency room and his pain has improved from a 10 to a 4. His enzymes are positive.   (Not in a hospital admission)  No Known Allergies  Past Medical History  Diagnosis Date  . Hypertension     Past Surgical History  Procedure Laterality Date  . No previous surgery      History   Social History  . Marital Status: Single    Spouse Name: N/A    Number of Children: 4  . Years of Education: N/A   Occupational History  .      Retired Secretary/administrator   Social History Main Topics  . Smoking status: Current Every Day Smoker -- 0.20 packs/day    Types: Cigarettes  . Smokeless tobacco: Not on file  . Alcohol Use: 1.2 oz/week    2 Cans of beer per week     Comment: 3 40 ounce beer per week  . Drug Use: No  . Sexual Activity: Not on file   Other Topics Concern  . Not on file   Social History Narrative  . No narrative on file    Family History  Problem Relation Age of Onset  . Heart disease      No family history    ROS:  no fevers or chills, productive cough, hemoptysis, dysphasia, odynophagia, melena, hematochezia, dysuria, hematuria, rash, seizure activity, orthopnea, PND, pedal edema, claudication. Remaining systems are negative.  Physical Exam:   Blood pressure 189/104, pulse 78, temperature 98.1 F (36.7 C), temperature source Oral, resp. rate 24, height 5' 1"  (1.549 m), weight 168 lb (76.204 kg), SpO2 98.00%.  General:  Well developed/well nourished in NAD Skin warm/dry Patient  not depressed No peripheral clubbing Back-normal HEENT-normal/normal eyelids Neck supple/normal carotid upstroke bilaterally; no bruits; no JVD; no thyromegaly chest - CTA/ normal expansion CV - RRR/normal S1 and S2; no murmurs, rubs or gallops;  PMI nondisplaced Abdomen -NT/ND, no HSM, no mass, + bowel sounds, no bruit 2+ femoral pulses, no bruits Ext-no edema, chords, 2+ DP Neuro-grossly nonfocal  ECG Sinus rhythm with lateral T-wave inversion  Results for orders placed during the hospital encounter of 03/17/14 (from the past 48 hour(s))  CBC WITH DIFFERENTIAL     Status: Abnormal   Collection Time    03/17/14  3:52 PM      Result Value Ref Range   WBC 10.8 (*) 4.0 - 10.5 K/uL   RBC 4.58  4.22 - 5.81 MIL/uL   Hemoglobin 13.7  13.0 - 17.0 g/dL   HCT 40.6  39.0 - 52.0 %   MCV 88.6  78.0 - 100.0 fL   MCH 29.9  26.0 - 34.0 pg   MCHC 33.7  30.0 - 36.0 g/dL   RDW 14.2  11.5 - 15.5 %   Platelets 324  150 - 400 K/uL   Neutrophils Relative % 74  43 - 77 %   Neutro Abs 8.1 (*) 1.7 - 7.7 K/uL   Lymphocytes Relative 20  12 - 46 %   Lymphs Abs 2.1  0.7 -  4.0 K/uL   Monocytes Relative 5  3 - 12 %   Monocytes Absolute 0.5  0.1 - 1.0 K/uL   Eosinophils Relative 1  0 - 5 %   Eosinophils Absolute 0.1  0.0 - 0.7 K/uL   Basophils Relative 0  0 - 1 %   Basophils Absolute 0.0  0.0 - 0.1 K/uL  BASIC METABOLIC PANEL     Status: Abnormal   Collection Time    03/17/14  3:52 PM      Result Value Ref Range   Sodium 141  137 - 147 mEq/L   Potassium 3.4 (*) 3.7 - 5.3 mEq/L   Chloride 100  96 - 112 mEq/L   CO2 26  19 - 32 mEq/L   Glucose, Bld 116 (*) 70 - 99 mg/dL   BUN 4 (*) 6 - 23 mg/dL   Creatinine, Ser 0.85  0.50 - 1.35 mg/dL   Calcium 8.8  8.4 - 10.5 mg/dL   GFR calc non Af Amer 88 (*) >90 mL/min   GFR calc Af Amer >90  >90 mL/min   Comment: (NOTE)     The eGFR has been calculated using the CKD EPI equation.     This calculation has not been validated in all clinical situations.      eGFR's persistently <90 mL/min signify possible Chronic Kidney     Disease.   Anion gap 15  5 - 15  TROPONIN I     Status: Abnormal   Collection Time    03/17/14  3:52 PM      Result Value Ref Range   Troponin I 5.80 (*) <0.30 ng/mL   Comment:            Due to the release kinetics of cTnI,     a negative result within the first hours     of the onset of symptoms does not rule out     myocardial infarction with certainty.     If myocardial infarction is still suspected,     repeat the test at appropriate intervals.     CRITICAL RESULT CALLED TO, READ BACK BY AND VERIFIED WITH:     K FIELDS,RN 1707 03/17/14 D BRADLEY  URINALYSIS, ROUTINE W REFLEX MICROSCOPIC     Status: Abnormal   Collection Time    03/17/14  3:52 PM      Result Value Ref Range   Color, Urine YELLOW  YELLOW   APPearance CLOUDY (*) CLEAR   Specific Gravity, Urine 1.010  1.005 - 1.030   pH 7.5  5.0 - 8.0   Glucose, UA NEGATIVE  NEGATIVE mg/dL   Hgb urine dipstick SMALL (*) NEGATIVE   Bilirubin Urine NEGATIVE  NEGATIVE   Ketones, ur 15 (*) NEGATIVE mg/dL   Protein, ur NEGATIVE  NEGATIVE mg/dL   Urobilinogen, UA 0.2  0.0 - 1.0 mg/dL   Nitrite NEGATIVE  NEGATIVE   Leukocytes, UA SMALL (*) NEGATIVE  URINE MICROSCOPIC-ADD ON     Status: Abnormal   Collection Time    03/17/14  3:52 PM      Result Value Ref Range   Squamous Epithelial / LPF RARE  RARE   WBC, UA 3-6  <3 WBC/hpf   RBC / HPF 3-6  <3 RBC/hpf   Bacteria, UA MANY (*) RARE    Dg Chest 2 View  03/17/2014   CLINICAL DATA:  Chest pain.  EXAM: CHEST  2 VIEW  COMPARISON:  08/08/2013.  FINDINGS: The cardiac silhouette, mediastinal  and hilar contours are normal and stable. 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 No acute pulmonary findings. The bony thorax is intact.  IMPRESSION: No acute cardiopulmonary findings.   Electronically Signed   By: Kalman Jewels M.D.   On: 03/17/2014 16:23    Assessment/Plan 1  non-ST elevation myocardial infarction-the patient's enzymes are positive. We'll treat with aspirin, heparin, nitroglycerin, beta blocker and statin. If his pain does not completely resolve he may require cardiac catheterization this evening. Otherwise we will proceed with cardiac catheterization tomorrow morning. The risks and benefits were discussed and he agrees to proceed. 2 hypertension-the patient is significantly hypertensive on presentation. We are treating with beta-blockade and nitroglycerin. Add additional medications based on followup readings. 3 tobacco abuse-patient counseled on discontinuing. 4 hypokalemia-supplement.  Kirk Ruths MD 03/17/2014, 6:26 PM

## 2014-03-17 NOTE — ED Notes (Addendum)
Pt to ED via [facility] with c/o left and right chest pain onset yesterday.  Pt also hypertensive, st's he did not take his blood pressure meds. Because his chest was hurting.  EMS gave pt ASA 324mg 

## 2014-03-17 NOTE — ED Notes (Signed)
Dr. Nanavati made aware 

## 2014-03-17 NOTE — Progress Notes (Signed)
Norman Mendez is now pain-free, cath in am.

## 2014-03-17 NOTE — ED Notes (Signed)
NTG 1 SL given for chest pain.  Pt rates chest pain #6 on pain scale 0/10.  B/P 190/103

## 2014-03-17 NOTE — ED Notes (Signed)
Critical Troponin called from lab.  Dr. Kathrynn Humble made aware

## 2014-03-17 NOTE — ED Notes (Signed)
Pt st's no relief from NTG.  St's now pain is mid chest.  B/P 181/97.  Cardiac monitor NSR.

## 2014-03-17 NOTE — ED Notes (Signed)
Attempted report 

## 2014-03-18 ENCOUNTER — Encounter (HOSPITAL_COMMUNITY)
Admission: EM | Disposition: A | Discharge status: Home / Self Care | Payer: Self-pay | Source: Home / Self Care | Attending: Cardiology

## 2014-03-18 DIAGNOSIS — I2510 Atherosclerotic heart disease of native coronary artery without angina pectoris: Secondary | ICD-10-CM

## 2014-03-18 DIAGNOSIS — E785 Hyperlipidemia, unspecified: Secondary | ICD-10-CM

## 2014-03-18 HISTORY — PX: LEFT HEART CATHETERIZATION WITH CORONARY ANGIOGRAM: SHX5451

## 2014-03-18 LAB — CBC
HCT: 38.4 % — ABNORMAL LOW (ref 39.0–52.0)
HEMATOCRIT: 40.1 % (ref 39.0–52.0)
HEMOGLOBIN: 13.5 g/dL (ref 13.0–17.0)
HEMOGLOBIN: 12.7 g/dL — AB (ref 13.0–17.0)
MCH: 30.3 pg (ref 26.0–34.0)
MCH: 29.9 pg (ref 26.0–34.0)
MCHC: 33.7 g/dL (ref 30.0–36.0)
MCHC: 33.1 g/dL (ref 30.0–36.0)
MCV: 89.9 fL (ref 78.0–100.0)
MCV: 90.4 fL (ref 78.0–100.0)
Platelets: 302 10*3/uL (ref 150–400)
Platelets: 263 10*3/uL (ref 150–400)
RBC: 4.46 MIL/uL (ref 4.22–5.81)
RBC: 4.25 MIL/uL (ref 4.22–5.81)
RDW: 14.1 % (ref 11.5–15.5)
RDW: 14.2 % (ref 11.5–15.5)
WBC: 13.3 10*3/uL — ABNORMAL HIGH (ref 4.0–10.5)
WBC: 15.2 10*3/uL — ABNORMAL HIGH (ref 4.0–10.5)

## 2014-03-18 LAB — COMPREHENSIVE METABOLIC PANEL
ALK PHOS: 109 U/L (ref 39–117)
ALT: 27 U/L (ref 0–53)
ANION GAP: 17 — AB (ref 5–15)
AST: 132 U/L — ABNORMAL HIGH (ref 0–37)
Albumin: 3 g/dL — ABNORMAL LOW (ref 3.5–5.2)
BUN: 6 mg/dL (ref 6–23)
CO2: 18 mEq/L — ABNORMAL LOW (ref 19–32)
Calcium: 8.5 mg/dL (ref 8.4–10.5)
Chloride: 100 mEq/L (ref 96–112)
Creatinine, Ser: 0.68 mg/dL (ref 0.50–1.35)
GLUCOSE: 117 mg/dL — AB (ref 70–99)
Potassium: 4.6 mEq/L (ref 3.7–5.3)
Sodium: 135 mEq/L — ABNORMAL LOW (ref 137–147)
TOTAL PROTEIN: 7.2 g/dL (ref 6.0–8.3)
Total Bilirubin: 0.8 mg/dL (ref 0.3–1.2)

## 2014-03-18 LAB — HEPARIN LEVEL (UNFRACTIONATED)
HEPARIN UNFRACTIONATED: 0.34 [IU]/mL (ref 0.30–0.70)
Heparin Unfractionated: 0.48 IU/mL (ref 0.30–0.70)

## 2014-03-18 LAB — TROPONIN I
Troponin I: 17.76 ng/mL (ref <0.30)
Troponin I: >20 ng/mL (ref <0.30)

## 2014-03-18 LAB — LIPID PANEL
Cholesterol: 234 mg/dL — ABNORMAL HIGH (ref 0–200)
HDL: 43 mg/dL (ref >39)
LDL Cholesterol: 174 mg/dL — ABNORMAL HIGH (ref 0–99)
Total CHOL/HDL Ratio: 5.4 RATIO
Triglycerides: 83 mg/dL (ref <150)
VLDL: 17 mg/dL (ref 0–40)

## 2014-03-18 LAB — HEMOGLOBIN A1C
Hgb A1c MFr Bld: 6.5 % — ABNORMAL HIGH (ref <5.7)
MEAN PLASMA GLUCOSE: 140 mg/dL — AB (ref <117)

## 2014-03-18 LAB — CREATININE, SERUM
CREATININE: 0.85 mg/dL (ref 0.50–1.35)
GFR calc Af Amer: >90 mL/min (ref >90)
GFR calc non Af Amer: 88 mL/min — ABNORMAL LOW (ref >90)

## 2014-03-18 LAB — PROTIME-INR
INR: 1.05 (ref 0.00–1.49)
Prothrombin Time: 13.7 seconds (ref 11.6–15.2)

## 2014-03-18 LAB — MAGNESIUM: Magnesium: 2 mg/dL (ref 1.5–2.5)

## 2014-03-18 LAB — APTT: APTT: 67 s — AB (ref 24–37)

## 2014-03-18 SURGERY — LEFT HEART CATHETERIZATION WITH CORONARY ANGIOGRAM
Anesthesia: LOCAL

## 2014-03-18 MED ORDER — VERAPAMIL HCL 2.5 MG/ML IV SOLN
INTRAVENOUS | Status: AC
  Filled 2014-03-18: qty 2

## 2014-03-18 MED ORDER — FENTANYL CITRATE 0.05 MG/ML IJ SOLN
INTRAMUSCULAR | Status: AC
  Filled 2014-03-18: qty 2

## 2014-03-18 MED ORDER — ASPIRIN 81 MG PO CHEW
81.0000 mg | CHEWABLE_TABLET | Freq: Every day | ORAL | Status: DC
  Administered 2014-03-19 – 2014-03-20 (×2): 81 mg via ORAL
  Filled 2014-03-18 (×2): qty 1

## 2014-03-18 MED ORDER — SODIUM CHLORIDE 0.9 % IV SOLN: INTRAVENOUS | Status: DC

## 2014-03-18 MED ORDER — MORPHINE SULFATE 4 MG/ML IJ SOLN
INTRAMUSCULAR | Status: AC
  Filled 2014-03-18: qty 1

## 2014-03-18 MED ORDER — NITROGLYCERIN IN D5W 200-5 MCG/ML-% IV SOLN
30.0000 ug/min | INTRAVENOUS | Status: DC
  Administered 2014-03-18: 30 ug/min via INTRAVENOUS

## 2014-03-18 MED ORDER — NITROGLYCERIN 1 MG/10 ML FOR IR/CATH LAB
INTRA_ARTERIAL | Status: AC
  Filled 2014-03-18: qty 10

## 2014-03-18 MED ORDER — ENOXAPARIN SODIUM 80 MG/0.8ML ~~LOC~~ SOLN
1.0000 mg/kg | Freq: Two times a day (BID) | SUBCUTANEOUS | Status: DC
  Filled 2014-03-18 (×2): qty 0.8

## 2014-03-18 MED ORDER — MORPHINE SULFATE 4 MG/ML IJ SOLN
3.0000 mg | INTRAMUSCULAR | Status: DC | PRN
  Administered 2014-03-18: 3 mg via INTRAVENOUS

## 2014-03-18 MED ORDER — LIDOCAINE HCL (PF) 1 % IJ SOLN
INTRAMUSCULAR | Status: AC
  Filled 2014-03-18: qty 30

## 2014-03-18 MED ORDER — ONDANSETRON HCL 4 MG/2ML IJ SOLN: 4.0000 mg | Freq: Four times a day (QID) | INTRAMUSCULAR | Status: DC | PRN

## 2014-03-18 MED ORDER — AMLODIPINE BESYLATE 5 MG PO TABS
5.0000 mg | ORAL_TABLET | Freq: Every day | ORAL | Status: DC
Start: 2014-03-18 — End: 2014-03-19
  Administered 2014-03-18: 5 mg via ORAL
  Filled 2014-03-18 (×2): qty 1

## 2014-03-18 MED ORDER — HEPARIN (PORCINE) IN NACL 2-0.9 UNIT/ML-% IJ SOLN
INTRAMUSCULAR | Status: AC
  Filled 2014-03-18: qty 1000

## 2014-03-18 MED ORDER — ENOXAPARIN SODIUM 80 MG/0.8ML ~~LOC~~ SOLN
1.0000 mg/kg | Freq: Two times a day (BID) | SUBCUTANEOUS | Status: AC
  Administered 2014-03-19 (×2): 75 mg via SUBCUTANEOUS
  Filled 2014-03-18 (×4): qty 0.8

## 2014-03-18 MED ORDER — MIDAZOLAM HCL 2 MG/2ML IJ SOLN
INTRAMUSCULAR | Status: AC
  Filled 2014-03-18: qty 2

## 2014-03-18 MED ORDER — HEPARIN SODIUM (PORCINE) 1000 UNIT/ML IJ SOLN
INTRAMUSCULAR | Status: AC
  Filled 2014-03-18: qty 1

## 2014-03-18 MED ORDER — SODIUM CHLORIDE 0.9 % IV SOLN
INTRAVENOUS | Status: AC
  Administered 2014-03-18 (×2): via INTRAVENOUS

## 2014-03-18 NOTE — Interval H&P Note (Signed)
Cath Lab Visit (complete for each Cath Lab visit)  Clinical Evaluation Leading to the Procedure:   ACS: Yes.    Non-ACS:    Anginal Classification: CCS III  Anti-ischemic medical therapy: No Therapy  Non-Invasive Test Results: No non-invasive testing performed  Prior CABG: No previous CABG      History and Physical Interval Note:  03/18/2014 4:08 PM  Norman Mendez  has presented today for surgery, with the diagnosis of NSTEMI  The various methods of treatment have been discussed with the patient and family. After consideration of risks, benefits and other options for treatment, the patient has consented to  Procedure(s): LEFT HEART CATHETERIZATION WITH CORONARY ANGIOGRAM (N/A) as a surgical intervention .  The patient's history has been reviewed, patient examined, no change in status, stable for surgery.  I have reviewed the patient's chart and labs.  Questions were answered to the patient's satisfaction.     Sinclair Grooms

## 2014-03-18 NOTE — CV Procedure (Signed)
     Left Heart Catheterization with Coronary Angiography  Report  Norman Mendez  68 y.o.  male 31-Mar-1946  Procedure Date: 03/18/2014 Referring Physician: Kirk Ruths, M.D. Primary Cardiologist:: Kirk Ruths, M.D.  INDICATIONS: Non-ST elevation MI  PROCEDURE: 1. Left heart catheterization; 2. Coronary angiography; 3. Left ventriculography  CONSENT:  The risks, benefits, and details of the procedure were explained in detail to the patient. Risks including death, stroke, heart attack, kidney injury, allergy, limb ischemia, bleeding and radiation injury were discussed.  The patient verbalized understanding and wanted to proceed.  Informed written consent was obtained.  PROCEDURE TECHNIQUE:  After Xylocaine anesthesia a 5 French Slender sheath was placed in the right  radial artery with an angiocath and the modified Seldinger technique.  Coronary angiography was done using a 5 F JR 4 and JL 3.5 cm diagnostic catheter.  Left ventriculography was done using the JR 4 catheter and hand injection.   Images were reviewed. A moderate size first obtuse marginal branch is totally occluded. No significant collateralization is noted. This would come to the patient's wall motion abnormality and his clinical presentation. Since the patient's infarct was greater than 24 hours old, no intervention was felt indicated.   CONTRAST:  Total of 90 cc.  COMPLICATIONS:  None   HEMODYNAMICS:  Aortic pressure 131/78 mmHg; LV pressure 135/0 mmHg; LVEDP 14 mm mercury  ANGIOGRAPHIC DATA:   The left main coronary artery is widely patent.  The left anterior descending artery is widely patent. It is very tortuous. It contains proximal calcification proximal 30 and distal 50% stenosis is noted. First diagonal branch is occluded in the midsegment. The diagonal branch is small to.  The left circumflex artery is dominant. There is 50% narrowing in the proximal vessel before the origin of the first obtuse  marginal. The first obtuse marginal in the proximal to mid segment is totally occluded with TIMI grade 1 flow. The distal territory appears moderate to large. The second and third marginals are widely patent. The fourth marginal contains 90% mid stenosis.  The right coronary artery is  nondominant and there is proximal to mid diffuse mid 70 to 90% stenosis. The distal territory is relatively small.  PCI RESULTS:  The first obtuse marginal branch represents a totally occluded vessel and is still the patient has a completed infarct in absence of symptoms and very high troponin levels. The right coronary is nondominant and be treated with medical therapy.  LEFT VENTRICULOGRAM:  Left ventricular angiogram was done in the 30 RAO projection and revealed anterior-lateral wall severe hypokinesis to akinesis with an overall low normal LV EF of 45-50%.   IMPRESSIONS:  1. Completed Non-ST elevation myocardial infarction due to occlusion of the first obtuse marginal branch. 2. 70-90% proximal to mid stenosis in a nondominant right coronary. The first diagonal of the LAD is small and totally occluded in the midsegment 3. Widely patent LAD and circumflex coronary artery other than as mentioned above. 4. Anterolateral wall motion abnormality and ejection fraction of approximately 50%.   RECOMMENDATION:  Medical therapy including aspirin, statin, blood pressure control, and smoking.

## 2014-03-18 NOTE — Progress Notes (Addendum)
Pt. Had episode of chest pain 6/10 scale, points to mid upper left chest, denies any other symptoms. BP was elevated NTG drip titrated x3 with no relief.  O2 administered at 2l [place] ,12 lead EKG was done no significant changes noted verified with charge nurse. Dr. Claiborne Billings was notified and with orders made. Morphine 3mg . IV was given with good relief and pt. was able to sleep after. Will continue to monitor pt.

## 2014-03-18 NOTE — Progress Notes (Signed)
ANTICOAGULATION CONSULT NOTE - Follow Up Consult  Pharmacy Consult for Heparin  Indication: chest pain/ACS  No Known Allergies  Patient Measurements: Height: 5\' 1"  (154.9 cm) Weight: 168 lb (76.204 kg) IBW/kg (Calculated) : 52.3  Vital Signs: Temp: 98 F (36.7 C) (07/21 2300) Temp src: Oral (07/21 2300) BP: 180/100 mmHg (07/21 2300) Pulse Rate: 78 (07/21 2300)  Labs:  Recent Labs  03/17/14 1552 03/17/14 2050 03/17/14 2340  HGB 13.7  --   --   HCT 40.6  --   --   PLT 324  --   --   APTT  --   --  67*  LABPROT  --   --  13.7  INR  --   --  1.05  HEPARINUNFRC  --   --  0.48  CREATININE 0.85  --   --   TROPONINI 5.80* >20.00*  --     Estimated Creatinine Clearance: 73.8 ml/min (by C-G formula based on Cr of 0.85).   Medications:  Heparin 1000 units/hr  Assessment: Heparin for elevated troponin. HL is 0.48. Other labs as above.   Goal of Therapy:  Heparin level 0.3-0.7 units/ml Monitor platelets by anticoagulation protocol: Yes   Plan:  -Continue heparin at 1000 units/hr -AM HL -Daily CBC/HL -Monitor for bleeding -Likely cath today  Narda Bonds 03/18/2014,12:40 AM

## 2014-03-18 NOTE — H&P (View-Only) (Signed)
    Subjective:  No current chest pain, on nitroglycerin drip. No shortness of breath. He was sleeping, easily arousable.  Objective:  Vital Signs in the last 24 hours: Temp:  [98 F (36.7 C)-99.4 F (37.4 C)] 99.4 F (37.4 C) (07/22 1100) Pulse Rate:  [68-93] 78 (07/22 1100) Resp:  [14-38] 22 (07/22 1100) BP: (134-216)/(69-114) 152/96 mmHg (07/22 1100) SpO2:  [92 %-100 %] 97 % (07/22 1100) Weight:  [162 lb 7.7 oz (73.7 kg)-168 lb (76.204 kg)] 162 lb 7.7 oz (73.7 kg) (07/22 0500)  Intake/Output from previous day: 07/21 0701 - 07/22 0700 In: 1097 [P.O.:120; I.V.:977] Out: 525 [Urine:525]   Physical Exam: General: Well developed, well nourished, in no acute distress. Head:  Normocephalic and atraumatic. Lungs: Clear to auscultation and percussion. Heart: Normal S1 and S2.  No murmur, rubs or gallops.  Abdomen: soft, non-tender, positive bowel sounds. Extremities: No clubbing or cyanosis. No edema. Neurologic: Alert and oriented x 3.    Lab Results:  Recent Labs  03/17/14 1552 03/18/14 0225  WBC 10.8* 13.3*  HGB 13.7 13.5  PLT 324 302    Recent Labs  03/17/14 1552 03/18/14 0225  NA 141 135*  K 3.4* 4.6  CL 100 100  CO2 26 18*  GLUCOSE 116* 117*  BUN 4* 6  CREATININE 0.85 0.68    Recent Labs  03/18/14 0225 03/18/14 0855  TROPONINI 17.76* >20.00*   Hepatic Function Panel  Recent Labs  03/18/14 0225  PROT 7.2  ALBUMIN 3.0*  AST 132*  ALT 27  ALKPHOS 109  BILITOT 0.8    Recent Labs  03/18/14 0225  CHOL 234*     Imaging: Dg Chest 2 View  03/17/2014   CLINICAL DATA:  Chest pain.  EXAM: CHEST  2 VIEW  COMPARISON:  08/08/2013.  FINDINGS: The cardiac silhouette, mediastinal and hilar contours are normal and stable. 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 No acute pulmonary findings. The bony thorax is intact.  IMPRESSION: No acute cardiopulmonary findings.   Electronically Signed   By: Kalman Jewels M.D.   On: 03/17/2014 16:23   Telemetry: No adverse arrhythmias Personally viewed.   EKG:  Sinus rhythm, LVH, T-wave inversion in lateral precordial leads concerning for ischemia  Cardiac Studies:  Cardiac catheterization pending  Assessment/Plan:   68 year old male with non-ST elevation myocardial infarction.  1. Non-ST elevation myocardial infarction-cardiac catheterization today. N.p.o. Questions answered. Nitroglycerin drip, currently chest pain-free, heparin drip. Continue with high-dose atorvastatin 80 mg, metoprolol tartrate 25 mg twice a day, aspirin.  Marked troponin elevation greater than 20. Question possibility of occluded circumflex.  2. Hypertension-I will add amlodipine 5 mg. If ejection fraction decreased, ACE inhibitor will be started. For now, given IV contrast load with upcoming catheterization, we will hold off.  3. Tobacco abuse-counseling cessation  4. Leukocytosis-white count 13.3, possibly reactive in the setting of MI.  5. Hyperlipidemia-LDL 174-atorvastatin 80.  Norman Mendez, [place] 03/18/2014, 11:43 AM

## 2014-03-18 NOTE — Progress Notes (Addendum)
    Subjective:  No current chest pain, on nitroglycerin drip. No shortness of breath. He was sleeping, easily arousable.  Objective:  Vital Signs in the last 24 hours: Temp:  [98 F (36.7 C)-99.4 F (37.4 C)] 99.4 F (37.4 C) (07/22 1100) Pulse Rate:  [68-93] 78 (07/22 1100) Resp:  [14-38] 22 (07/22 1100) BP: (134-216)/(69-114) 152/96 mmHg (07/22 1100) SpO2:  [92 %-100 %] 97 % (07/22 1100) Weight:  [162 lb 7.7 oz (73.7 kg)-168 lb (76.204 kg)] 162 lb 7.7 oz (73.7 kg) (07/22 0500)  Intake/Output from previous day: 07/21 0701 - 07/22 0700 In: 1097 [P.O.:120; I.V.:977] Out: 525 [Urine:525]   Physical Exam: General: Well developed, well nourished, in no acute distress. Head:  Normocephalic and atraumatic. Lungs: Clear to auscultation and percussion. Heart: Normal S1 and S2.  No murmur, rubs or gallops.  Abdomen: soft, non-tender, positive bowel sounds. Extremities: No clubbing or cyanosis. No edema. Neurologic: Alert and oriented x 3.    Lab Results:  Recent Labs  03/17/14 1552 03/18/14 0225  WBC 10.8* 13.3*  HGB 13.7 13.5  PLT 324 302    Recent Labs  03/17/14 1552 03/18/14 0225  NA 141 135*  K 3.4* 4.6  CL 100 100  CO2 26 18*  GLUCOSE 116* 117*  BUN 4* 6  CREATININE 0.85 0.68    Recent Labs  03/18/14 0225 03/18/14 0855  TROPONINI 17.76* >20.00*   Hepatic Function Panel  Recent Labs  03/18/14 0225  PROT 7.2  ALBUMIN 3.0*  AST 132*  ALT 27  ALKPHOS 109  BILITOT 0.8    Recent Labs  03/18/14 0225  CHOL 234*     Imaging: Dg Chest 2 View  03/17/2014   CLINICAL DATA:  Chest pain.  EXAM: CHEST  2 VIEW  COMPARISON:  08/08/2013.  FINDINGS: The cardiac silhouette, mediastinal and hilar contours are normal and stable. 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 No acute pulmonary findings. The bony thorax is intact.  IMPRESSION: No acute cardiopulmonary findings.   Electronically Signed   By: Kalman Jewels M.D.   On: 03/17/2014 16:23   Telemetry: No adverse arrhythmias Personally viewed.   EKG:  Sinus rhythm, LVH, T-wave inversion in lateral precordial leads concerning for ischemia  Cardiac Studies:  Cardiac catheterization pending  Assessment/Plan:   68 year old male with non-ST elevation myocardial infarction.  1. Non-ST elevation myocardial infarction-cardiac catheterization today. N.p.o. Questions answered. Nitroglycerin drip, currently chest pain-free, heparin drip. Continue with high-dose atorvastatin 80 mg, metoprolol tartrate 25 mg twice a day, aspirin.  Marked troponin elevation greater than 20. Question possibility of occluded circumflex.  2. Hypertension-I will add amlodipine 5 mg. If ejection fraction decreased, ACE inhibitor will be started. For now, given IV contrast load with upcoming catheterization, we will hold off.  3. Tobacco abuse-counseling cessation  4. Leukocytosis-white count 13.3, possibly reactive in the setting of MI.  5. Hyperlipidemia-LDL 174-atorvastatin 80.  Norman Mendez, [place] 03/18/2014, 11:43 AM

## 2014-03-18 NOTE — Progress Notes (Signed)
ANTICOAGULATION CONSULT NOTE - Follow Up Consult  Pharmacy Consult for Heparin  Indication: NSTEMI  No Known Allergies  Patient Measurements: Height: 5\' 1"  (154.9 cm) Weight: 162 lb 7.7 oz (73.7 kg) IBW/kg (Calculated) : 52.3  Vital Signs: Temp: 98.5 F (36.9 C) (07/22 0700) Temp src: Oral (07/22 0700) BP: 161/84 mmHg (07/22 0900) Pulse Rate: 75 (07/22 0900)  Labs:  Recent Labs  03/17/14 1552 03/17/14 2050 03/17/14 2340 03/18/14 0225 03/18/14 0855  HGB 13.7  --   --  13.5  --   HCT 40.6  --   --  40.1  --   PLT 324  --   --  302  --   APTT  --   --  67*  --   --   LABPROT  --   --  13.7  --   --   INR  --   --  1.05  --   --   HEPARINUNFRC  --   --  0.48  --  0.34  CREATININE 0.85  --   --  0.68  --   TROPONINI 5.80* >20.00*  --  17.76* >20.00*    Estimated Creatinine Clearance: 77.2 ml/min (by C-G formula based on Cr of 0.68).   Medications:  . sodium chloride 1 mL/kg/hr (03/18/14 0900)  . heparin 1,000 Units/hr (03/18/14 0900)  . nitroGLYCERIN 60 mcg/min (03/18/14 0925)    Assessment: 68 y/o male who presented to the ED on 7/21 with chest pain and being treated for NSTEMI. He remains on IV heparin and level is therapeutic at 0.34 on 1000 units/hr. Plan is for cath today.  Goal of Therapy:  Heparin level 0.3-0.7 units/ml Monitor platelets by anticoagulation protocol: Yes   Plan:  -Continue heparin drip at 1000 units/hr -Daily CBC/Heparin level -Monitor for bleeding -F/U after cath today  [facility], Pharm.D., BCPS Clinical Pharmacist Pager: [phone] 03/18/2014 10:55 AM

## 2014-03-18 NOTE — Care Management Note (Addendum)
    Page 1 of 1   03/20/2014     11:41:43 AM CARE MANAGEMENT NOTE 03/20/2014  Patient:  ADOLPHO, MEENACH   Account Number:  [id number]  Date Initiated:  03/18/2014  Documentation initiated by:  Elissa Hefty  Subjective/Objective Assessment:   adm w mi     Action/Plan:   lives alone, pcp dr Randall Hiss dean   Anticipated DC Date:     Anticipated DC Plan:           Choice offered to / List presented to:             Status of service:  Completed, signed off Medicare Important Message given?  YES (If response is "NO", the following Medicare IM given date fields will be blank) Date Medicare IM given:  03/20/2014 Medicare IM given by:  Hendry Speas Date Additional Medicare IM given:   Additional Medicare IM given by:    Discharge Disposition:  HOME/SELF CARE  Per UR Regulation:  Reviewed for med. necessity/level of care/duration of stay  If discussed at [place] of Stay Meetings, dates discussed:    Comments:

## 2014-03-19 DIAGNOSIS — I214 Non-ST elevation (NSTEMI) myocardial infarction: Secondary | ICD-10-CM

## 2014-03-19 DIAGNOSIS — F17200 Nicotine dependence, unspecified, uncomplicated: Secondary | ICD-10-CM

## 2014-03-19 LAB — CBC
HCT: 35.5 % — ABNORMAL LOW (ref 39.0–52.0)
HEMOGLOBIN: 11.9 g/dL — AB (ref 13.0–17.0)
MCH: 29.5 pg (ref 26.0–34.0)
MCHC: 33.5 g/dL (ref 30.0–36.0)
MCV: 88.1 fL (ref 78.0–100.0)
PLATELETS: 269 10*3/uL (ref 150–400)
RBC: 4.03 MIL/uL — AB (ref 4.22–5.81)
RDW: 14.2 % (ref 11.5–15.5)
WBC: 13.9 10*3/uL — AB (ref 4.0–10.5)

## 2014-03-19 MED ORDER — LISINOPRIL 5 MG PO TABS
5.0000 mg | ORAL_TABLET | Freq: Every day | ORAL | Status: DC
  Administered 2014-03-19 – 2014-03-20 (×2): 5 mg via ORAL
  Filled 2014-03-19 (×2): qty 1

## 2014-03-19 NOTE — Progress Notes (Signed)
Pt resting quietly in bed, denies any chest pain.

## 2014-03-19 NOTE — Progress Notes (Signed)
Report called to receiving nurse on unit 2W 04 . Pt aware of pending tx .

## 2014-03-19 NOTE — Progress Notes (Signed)
1330-1414 Pt in bed ready for transfer. Belongings in bed with pt. Told RN would educate pt until he needed to be transferred. Discussed smoking cessation and gave handout.. Pt stated he smokes two cigarettes a day. Discussed heart healthy diet as pt likes to add fat back to greens and pinto beans. Discussed healthier options and gave heart healthy diet. Discussed importance of statin. Reviewed NTG use, MI restrictions,risk factors. Pt able to answer teachback questions. Discussed CRP 2 and pt gave permission to refer. He is going to consider doing but he will have to ride bus as he does not have transportation. Will follow up tomorrow for ambulation. Graylon Good RN BSN 03/19/2014 2:15 PM

## 2014-03-19 NOTE — Progress Notes (Signed)
    Subjective:  No current chest pain, no shortness of breath. No dysuria.  Objective:  Vital Signs in the last 24 hours: Temp:  [98.3 F (36.8 C)-99.5 F (37.5 C)] 98.3 F (36.8 C) (07/23 0810) Pulse Rate:  [65-90] 66 (07/23 0700) Resp:  [13-29] 16 (07/23 0700) BP: (97-152)/(51-128) 121/69 mmHg (07/23 0700) SpO2:  [94 %-100 %] 100 % (07/23 0700) Weight:  [164 lb 0.4 oz (74.4 kg)] 164 lb 0.4 oz (74.4 kg) (07/23 0615)  Intake/Output from previous day: 07/22 0701 - 07/23 0700 In: 1125.9 [I.V.:1125.9] Out: 1175 [Urine:1175]   Physical Exam: General: Well developed, well nourished, in no acute distress. Head:  Normocephalic and atraumatic. Lungs: Clear to auscultation and percussion. Heart: Normal S1 and S2.  No murmur, rubs or gallops.  Abdomen: soft, non-tender, positive bowel sounds. Extremities: No clubbing or cyanosis. No edema. Radial artery catheterization site clean, dry, intact, very small hematoma/nodule. Normal distal pulses. Neurologic: Alert and oriented x 3.    Lab Results:  Recent Labs  03/18/14 1933 03/19/14 0224  WBC 15.2* 13.9*  HGB 12.7* 11.9*  PLT 263 269    Recent Labs  03/17/14 1552 03/18/14 0225 03/18/14 1933  NA 141 135*  --   K 3.4* 4.6  --   CL 100 100  --   CO2 26 18*  --   GLUCOSE 116* 117*  --   BUN 4* 6  --   CREATININE 0.85 0.68 0.85    Recent Labs  03/18/14 0225 03/18/14 0855  TROPONINI 17.76* >20.00*   Hepatic Function Panel  Recent Labs  03/18/14 0225  PROT 7.2  ALBUMIN 3.0*  AST 132*  ALT 27  ALKPHOS 109  BILITOT 0.8    Recent Labs  03/18/14 0225  CHOL 234*     Imaging: Dg Chest 2 View  03/17/2014   CLINICAL DATA:  Chest pain.  EXAM: CHEST  2 VIEW  COMPARISON:  08/08/2013.  FINDINGS: The cardiac silhouette, mediastinal and hilar contours are normal and stable. 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 No acute pulmonary findings. The bony thorax is  intact.  IMPRESSION: No acute cardiopulmonary findings.   Electronically Signed   By: Kalman Jewels M.D.   On: 03/17/2014 16:23   Telemetry: No adverse arrhythmias Personally viewed.   EKG:  Sinus rhythm, LVH, T-wave inversion in lateral precordial leads concerning for ischemia  Cardiac Studies:  Cardiac catheterization 03/18/14 - occlusion to first obtuse marginal, completed infarction. 70-90% proximal to mid stenosis and nondominant right coronary artery, first diagonal the LAD is small and totally occluded in mid segment, widely patent LAD otherwise. Anterolateral wall motion abnormality with EF of 50%. Medical management.  Assessment/Plan:   68 year old male with non-ST elevation myocardial infarction.  1. Non-ST elevation myocardial infarction-completed infarction with occluded obtuse marginal. Medical management. Currently chest pain-free, heparin drip. Continue with high-dose atorvastatin 80 mg, metoprolol tartrate 25 mg twice a day, aspirin. Stop Lovenox tomorrow morning.  Marked troponin elevation greater than 20.  2. Hypertension-I will stop amlodipine 5 mg. ACE inhibitor will be started, lisinopril 5 mg, secondary to ejection fraction of 45-50%.  3. Tobacco abuse-counseling cessation  4. Leukocytosis-white count down 13.9, possibly reactive in the setting of MI. Gram-negative rod noted in urinalysis however he does not have any symptoms, no dysuria. No treatment at this time.  5. Hyperlipidemia-LDL 174-atorvastatin 80. High dose statin.  Discharge tomorrow.  Veleria Barnhardt, [place] 03/19/2014, 10:59 AM

## 2014-03-19 NOTE — Progress Notes (Signed)
Patient tolerated ambulation in hallway 250 feet gait steady, denies chest pain or sob Norman Mendez

## 2014-03-20 LAB — CBC
HCT: 35.8 % — ABNORMAL LOW (ref 39.0–52.0)
HEMOGLOBIN: 11.9 g/dL — AB (ref 13.0–17.0)
MCH: 29.5 pg (ref 26.0–34.0)
MCHC: 33.2 g/dL (ref 30.0–36.0)
MCV: 88.6 fL (ref 78.0–100.0)
Platelets: 252 10*3/uL (ref 150–400)
RBC: 4.04 MIL/uL — ABNORMAL LOW (ref 4.22–5.81)
RDW: 14.4 % (ref 11.5–15.5)
WBC: 10.7 10*3/uL — ABNORMAL HIGH (ref 4.0–10.5)

## 2014-03-20 LAB — URINE CULTURE

## 2014-03-20 LAB — TROPONIN I: TROPONIN I: 6.52 ng/mL — AB (ref <0.30)

## 2014-03-20 MED ORDER — NITROGLYCERIN 0.4 MG SL SUBL: 0.4000 mg | SUBLINGUAL_TABLET | SUBLINGUAL | Status: DC | PRN

## 2014-03-20 MED ORDER — ATORVASTATIN CALCIUM 80 MG PO TABS: 80.0000 mg | ORAL_TABLET | Freq: Every day | ORAL | Status: DC

## 2014-03-20 MED ORDER — LISINOPRIL 5 MG PO TABS: 5.0000 mg | ORAL_TABLET | Freq: Every day | ORAL | Status: DC

## 2014-03-20 MED ORDER — ASPIRIN 81 MG PO CHEW: 81.0000 mg | CHEWABLE_TABLET | Freq: Every day | ORAL | Status: DC

## 2014-03-20 MED ORDER — METOPROLOL TARTRATE 25 MG PO TABS: 25.0000 mg | ORAL_TABLET | Freq: Two times a day (BID) | ORAL | Status: DC

## 2014-03-20 NOTE — Progress Notes (Signed)
    SUBJECTIVE:  No chest pain.  No SOB.   PHYSICAL EXAM Filed Vitals:   03/19/14 1400 03/19/14 1456 03/19/14 1954 03/20/14 0355  BP: 113/57 126/73 113/64 116/64  Pulse: 83  86 71  Temp:  98 F (36.7 C) 98.4 F (36.9 C) 98.1 F (36.7 C)  TempSrc:  Oral Oral Oral  Resp: 17 18 18 18   Height:      Weight:    164 lb 3.2 oz (74.481 kg)  SpO2: 98% 95% 96% 99%   General:  No distress Lungs:  Clear Heart:  RRR Abdomen:   Positive bowel sounds, no rebound no guarding Extremities:  No edema   LABS: Lab Results  Component Value Date   TROPONINI >20.00* 03/18/2014   Results for orders placed during the hospital encounter of 03/17/14 (from the past 24 hour(s))  CBC     Status: Abnormal   Collection Time    03/20/14  3:55 AM      Result Value Ref Range   WBC 10.7 (*) 4.0 - 10.5 K/uL   RBC 4.04 (*) 4.22 - 5.81 MIL/uL   Hemoglobin 11.9 (*) 13.0 - 17.0 g/dL   HCT 35.8 (*) 39.0 - 52.0 %   MCV 88.6  78.0 - 100.0 fL   MCH 29.5  26.0 - 34.0 pg   MCHC 33.2  30.0 - 36.0 g/dL   RDW 14.4  11.5 - 15.5 %   Platelets 252  150 - 400 K/uL    Intake/Output Summary (Last 24 hours) at 03/20/14 0724 Last data filed at 03/20/14 0500  Gross per 24 hour  Intake    540 ml  Output   1250 ml  Net   -710 ml    ASSESSMENT AND PLAN:  NSTMI:  OK to discharge.    HTN:  BP OK.  No change in meds.   TOBACCO ABUSE:  Kalman Drape 03/20/2014 7:24 AM

## 2014-03-20 NOTE — Progress Notes (Signed)
Nursing note Patient given discharge instructions, AVS, medication list. Pt prescriptions sent to personal pharmacy. Aaron Edelman Haagar PAC made aware of pt troponin results. Stated OK to discharge homePatient will be discharged home as ordered. Aysen Shieh, Bettina Gavia RN

## 2014-03-20 NOTE — Progress Notes (Signed)
CARDIAC REHAB PHASE I   PRE:  Rate/Rhythm: 79 SR  BP:  Supine: 112/60  Sitting:   Standing:    SaO2:   MODE:  Ambulation: 660 ft   POST:  Rate/Rhythm: 90 SR  BP:  Supine:   Sitting: 120/60  Standing:    SaO2:  0830-0845 Pt walked 660 ft independently with steady gait. No CP. Tolerated well. No questions re ed yesterday. To bed after walk.   Graylon Good, RN BSN  03/20/2014 8:42 AM

## 2014-03-20 NOTE — Discharge Summary (Signed)
Physician Discharge Summary     Cardiologist: Crenshaw  Patient ID: PATRYCK KILGORE MRN: [id number] DOB/AGE: April 22, 1946 68 y.o.  Admit date: 03/17/2014 Discharge date: 03/20/2014  Admission Diagnoses:  NSTEMI  Discharge Diagnoses:  Principal Problem:   Non-ST elevation myocardial infarction (NSTEMI) Active Problems:   Tobacco use   Hyperlipidemia   Hypokalemia  Discharged Condition: stable  Hospital Course:   68 year old male with no prior cardiac history for evaluation of non-ST elevation myocardial infarction. Patient had his first episode of chest pain yesterday morning. It was diffuse and described as indigestion. Not pleuritic, positional, related to food. The pain did not radiate. No associated symptoms. The pain resolved after 30 minutes. He developed recurrent symptoms At 11 AM today. The pain was more severe and continued. He has presented to the emergency room and his pain has improved from a 10 to a 4. His enzymes are positive  He was admitted and started on heparin, NTG, which along with BB and amlodipine was used to treat HTN.  He was taken to the cath lab and angiography revealed to occlusion of the first obtuse marginal branch. 70-90% proximal to mid stenosis in a nondominant right coronary. The first diagonal of the LAD is small and totally occluded in the midsegment.  Widely patent LAD and circumflex coronary artery other than as mentioned above.  Anterolateral wall motion abnormality and ejection fraction of approximately 50%. Lisinopril was added as well.   Tobacco cessation was discussed.   LDL 174- Statin started.   The patient was seen by Dr. Percival Spanish who felt he was stable for DC home.     Consults: Cardiac rehab  Significant Diagnostic Studies:   Lipid Panel     Component Value Date/Time   CHOL 234* 03/18/2014 0225   TRIG 83 03/18/2014 0225   HDL 43 03/18/2014 0225   CHOLHDL 5.4 03/18/2014 0225   VLDL 17 03/18/2014 0225   LDLCALC 174* 03/18/2014 0225      Left Heart Catheterization with Coronary Angiography Report  BENSEN CHADDERDON  68 y.o.  male  May 27, 1946  Procedure Date: 03/18/2014  Referring Physician: Kirk Ruths, M.D.  Primary Cardiologist:: Kirk Ruths, M.D.  INDICATIONS: Non-ST elevation MI  PROCEDURE: 1. Left heart catheterization; 2. Coronary angiography; 3. Left ventriculography  CONSENT:  The risks, benefits, and details of the procedure were explained in detail to the patient. Risks including death, stroke, heart attack, kidney injury, allergy, limb ischemia, bleeding and radiation injury were discussed. The patient verbalized understanding and wanted to proceed. Informed written consent was obtained.  PROCEDURE TECHNIQUE: After Xylocaine anesthesia a 5 French Slender sheath was placed in the right radial artery with an angiocath and the modified Seldinger technique. Coronary angiography was done using a 5 F JR 4 and JL 3.5 cm diagnostic catheter. Left ventriculography was done using the JR 4 catheter and hand injection.  Images were reviewed. A moderate size first obtuse marginal branch is totally occluded. No significant collateralization is noted. This would come to the patient's wall motion abnormality and his clinical presentation. Since the patient's infarct was greater than 24 hours old, no intervention was felt indicated.  CONTRAST: Total of 90 cc.  COMPLICATIONS: None  HEMODYNAMICS: Aortic pressure 131/78 mmHg; LV pressure 135/0 mmHg; LVEDP 14 mm mercury  ANGIOGRAPHIC DATA: The left main coronary artery is widely patent.  The left anterior descending artery is widely patent. It is very tortuous. It contains proximal calcification proximal 30 and distal 50% stenosis is noted. First diagonal  branch is occluded in the midsegment. The diagonal branch is small to.  The left circumflex artery is dominant. There is 50% narrowing in the proximal vessel before the origin of the first obtuse marginal. The first obtuse  marginal in the proximal to mid segment is totally occluded with TIMI grade 1 flow. The distal territory appears moderate to large. The second and third marginals are widely patent. The fourth marginal contains 90% mid stenosis.  The right coronary artery is nondominant and there is proximal to mid diffuse mid 70 to 90% stenosis. The distal territory is relatively small.  PCI RESULTS: The first obtuse marginal branch represents a totally occluded vessel and is still the patient has a completed infarct in absence of symptoms and very high troponin levels. The right coronary is nondominant and be treated with medical therapy.  LEFT VENTRICULOGRAM: Left ventricular angiogram was done in the 30 RAO projection and revealed anterior-lateral wall severe hypokinesis to akinesis with an overall low normal LV EF of 45-50%.  IMPRESSIONS: 1. Completed Non-ST elevation myocardial infarction due to occlusion of the first obtuse marginal branch.  2. 70-90% proximal to mid stenosis in a nondominant right coronary. The first diagonal of the LAD is small and totally occluded in the midsegment  3. Widely patent LAD and circumflex coronary artery other than as mentioned above.  4. Anterolateral wall motion abnormality and ejection fraction of approximately 50%.  RECOMMENDATION: Medical therapy including aspirin, statin, blood pressure control, and smoking.  Treatments:  See above  Discharge Exam: Blood pressure 116/64, pulse 71, temperature 98.1 F (36.7 C), temperature source Oral, resp. rate 18, height 5\' 1"  (1.549 m), weight 164 lb 3.2 oz (74.481 kg), SpO2 99.00%.   Disposition: 01-Home or Self Care      Discharge Instructions   Amb Referral to Cardiac Rehabilitation    Complete by:  As directed      Diet - low sodium heart healthy    Complete by:  As directed      Discharge instructions    Complete by:  As directed   No lifting with right arm for 3 days     Increase activity slowly    Complete by:  As  directed             Medication List    STOP taking these medications       atenolol 50 MG tablet  Commonly known as:  TENORMIN      TAKE these medications       aspirin 81 MG chewable tablet  Chew 1 tablet (81 mg total) by mouth daily.     atorvastatin 80 MG tablet  Commonly known as:  LIPITOR  Take 1 tablet (80 mg total) by mouth daily at 6 PM.     lisinopril 5 MG tablet  Commonly known as:  PRINIVIL,ZESTRIL  Take 1 tablet (5 mg total) by mouth daily.     metoprolol tartrate 25 MG tablet  Commonly known as:  LOPRESSOR  Take 1 tablet (25 mg total) by mouth 2 (two) times daily.     nitroGLYCERIN 0.4 MG SL tablet  Commonly known as:  NITROSTAT  Place 1 tablet (0.4 mg total) under the tongue every 5 (five) minutes x 3 doses as needed for chest pain.       Follow-up Information   Follow up with HAGER, BRYAN, PA-C On 04/15/2014. (10:00 AM)    Specialty:  Physician Assistant   Contact information:   [place]  [address] [place] [address] [phone]      Greater than 30 minutes was spent completing the patient's discharge.    SignedTarri Fuller, PA-C  03/20/2014, 8:37 AM   Patient seen and examined.  Plan as discussed in my rounding note for today and outlined above. Jeneen Rinks [facility]  03/20/2014  8:49 AM

## 2014-04-15 ENCOUNTER — Encounter: Payer: Medicare Other | Admitting: Physician Assistant

## 2014-04-23 ENCOUNTER — Encounter: Payer: Medicare Other | Admitting: Physician Assistant

## 2014-08-06 ENCOUNTER — Encounter (HOSPITAL_COMMUNITY): Payer: Self-pay | Admitting: Interventional Cardiology

## 2014-09-11 ENCOUNTER — Encounter: Payer: Self-pay | Admitting: Family Medicine

## 2014-09-11 ENCOUNTER — Ambulatory Visit (INDEPENDENT_AMBULATORY_CARE_PROVIDER_SITE_OTHER): Payer: Medicare Other | Admitting: Family Medicine

## 2014-09-11 VITALS — BP 160/90 | HR 93 | Temp 97.7°F | Ht 66.0 in | Wt 173.3 lb

## 2014-09-11 DIAGNOSIS — I214 Non-ST elevation (NSTEMI) myocardial infarction: Secondary | ICD-10-CM

## 2014-09-11 DIAGNOSIS — Z720 Tobacco use: Secondary | ICD-10-CM | POA: Diagnosis not present

## 2014-09-11 DIAGNOSIS — I10 Essential (primary) hypertension: Secondary | ICD-10-CM | POA: Insufficient documentation

## 2014-09-11 DIAGNOSIS — E785 Hyperlipidemia, unspecified: Secondary | ICD-10-CM | POA: Diagnosis not present

## 2014-09-11 LAB — COMPREHENSIVE METABOLIC PANEL
ALBUMIN: 3.7 g/dL (ref 3.5–5.2)
ALK PHOS: 100 U/L (ref 39–117)
ALT: 21 U/L (ref 0–53)
AST: 15 U/L (ref 0–37)
BILIRUBIN TOTAL: 0.9 mg/dL (ref 0.2–1.2)
BUN: 7 mg/dL (ref 6–23)
CO2: 24 mEq/L (ref 19–32)
Calcium: 8.8 mg/dL (ref 8.4–10.5)
Chloride: 107 mEq/L (ref 96–112)
Creat: 0.92 mg/dL (ref 0.50–1.35)
Glucose, Bld: 101 mg/dL — ABNORMAL HIGH (ref 70–99)
Potassium: 3.8 mEq/L (ref 3.5–5.3)
Sodium: 140 mEq/L (ref 135–145)
Total Protein: 7.1 g/dL (ref 6.0–8.3)

## 2014-09-11 MED ORDER — ASPIRIN 81 MG PO CHEW
81.0000 mg | CHEWABLE_TABLET | Freq: Every day | ORAL | Status: DC
Start: 2014-09-11 — End: 2017-06-07

## 2014-09-11 MED ORDER — METOPROLOL TARTRATE 25 MG PO TABS: 25.0000 mg | ORAL_TABLET | Freq: Two times a day (BID) | ORAL | Status: DC

## 2014-09-11 MED ORDER — ATORVASTATIN CALCIUM 80 MG PO TABS: 80.0000 mg | ORAL_TABLET | Freq: Every day | ORAL | Status: DC

## 2014-09-11 MED ORDER — LISINOPRIL 5 MG PO TABS: 5.0000 mg | ORAL_TABLET | Freq: Every day | ORAL | Status: DC

## 2014-09-11 NOTE — Assessment & Plan Note (Signed)
BP initially elevated.  Recheck WNL No red flags. -Continue Lisinopril.  Restart Metoprolol -CMET ordered -F/U next week.

## 2014-09-11 NOTE — Patient Instructions (Addendum)
It was a pleasure seeing you today!  Information regarding what we discussed is included in this packet.  Please feel free to call our office at [phone] if any questions or concerns arise.  Please schedule an appointment to see me next week for blood pressure and medication follow up.  Please continue taking your medications as directed.  You have refills for Metoprolol (high blood pressure medicine), Lisinopril (high blood pressure medication) and Atorvastatin (cholesterol or fats in your blood, helps you not have a stroke or heart attack).  Ashly M. [name], DO [facility]  Cardiac Diet A cardiac diet can help stop heart disease or a stroke from happening. It involves eating less unhealthy fats and eating more healthy fats.  FOODS TO AVOID OR LIMIT  Limit saturated fats. This type of fat is found in oils and dairy products, such as:  Coconut oil.  Palm oil.  Cocoa butter.  Butter.  Avoid trans-fat or hydrogenated oils. These are found in fried or pre-made baked goods, such as:  Margarine.  Pre-made cookies, cakes, and crackers.  Limit processed meats (hot dogs, deli meats, sausage) to 3 ounces a week.  Limit high-fat meats (marbled meats, fried chicken, or chicken with skin) to 3 ounces a week.  Limit salt (sodium) to 1500 milligrams a day.   Limit sweets and drinks with added sugar to no more than 5 servings a week. One serving is:  1 tablespoon of sugar.  1 tablespoon of jelly or jam.   cup sorbet.  1 cup lemonade.   cup regular soda. EAT MORE OF THE FOLLOWING FOODS Fruit  Eat 4to 5 servings a day. One serving of fruit is:  1 medium whole fruit.   cup dried fruit.   cup of fresh, frozen, or canned fruit.   cup 100% fruit juice. Vegetables  Eat 4 to 5 servings a day. One serving is:  1 cup raw leafy vegetables.   cup raw or cooked, cut-up vegetables.   cup vegetable juice. Whole Grains  Eat 3 servings a day (1 ounce  equals 1 serving). Legumes (such as beans, peas, and lentils)   Eat at least 4 servings a week ( cup equals 1 serving). Nuts and Seeds   Eat at least 4 servings a week ( cup equals 1 serving). Dietary Fiber  Eat 20 to 30 grams a day. Some foods high in dietary fiber include:  Dried beans.  Citrus fruits.  Apples, bananas.  Broccoli, Brussels sprouts, and eggplant.  Oats. Omega-3 Fats  Eat food with omega-3 fats. You can also take a dietary pill (supplement) that has 1 gram of DHA and EPA. Have 3.5 ounces of fatty fish a week, such as:  Salmon.  Mackerel.  [place] tuna.  Sardines.  Lake trout.  Herring. PREPARING YOUR FOOD  Broil, bake, steam, or roast foods. Do not fry food. Do not cook food in butter (fat).  Use non-stick cooking sprays.  Remove skin from poultry, such as chicken and [place].  Remove fat from meat.  Take the fat off the top of stews, soups, and gravy.  Use lemon or herbs to flavor food instead of using butter or margarine.  Use nonfat yogurt, salsa, or low-fat dressings for salads. Document Released: 02/13/2012 Document Reviewed: 02/13/2012 [facility] Patient Information 2015 [place]. This information is not intended to replace advice given to you by your health care provider. Make sure you discuss any questions you have with your health care provider.

## 2014-09-11 NOTE — Assessment & Plan Note (Signed)
Lipitor refilled.   -Direct LDL at next appointment.

## 2014-09-11 NOTE — Progress Notes (Addendum)
Patient ID: Norman Mendez, male   DOB: 1945/10/26, 69 y.o.   MRN: [id number]    Subjective: CC: Establish care HPI: Patient is a 69 y.o. male presenting to clinic today for establish care. Concerns today include:  1. HTN Patient reports compliance with Lisinopril.  Does not monitor BP at home.  He was not aware that he was supposed to be taking Metoprolol as well.  Walks at least 0.5 miles daily for exercise.  Denies CP, SOB, headache, fatigue.  2. H/O NSTEMI Patient with NSTEMI in July 2015.  Discharged with ASA, Metoprolol, Lisinopril, Atorvastatin, Nitro SL.  Patient not taking Metoprolol and Atorvastatin.  Again, did not know he was supposed to be taking these medications. No CP since hospitalization last year.  Keeps Nitro on hand for CP.  3. Tobacco use Patient reports that he smokes 1 cig/ day.  He states, "I just need to leave it alone completely." when asked what stops him from smoking no cigarettes daily, he states, "eating".  He is afraid that he will replace cig with increased eating.   Social History Reviewed: yes smoker. FamHx and MedHx updated.  Please see EMR.  ROS: All other systems reviewed and are negative.  Objective: Office vital signs reviewed. BP 160/90 mmHg  Pulse 93  Temp(Src) 97.7 F (36.5 C) (Oral)  Ht 5\' 6"  (1.676 m)  Wt 173 lb 4.8 oz (78.608 kg)  BMI 27.98 kg/m2  Physical Examination:  General: Awake, alert, well nourished, pleasant, NAD HEENT: Normal, EOMI Cardio: regular rate, occasional skipped beat, S1S2 heard, no murmurs appreciated Pulm: CTAB, no wheezes, rhonchi or rales Extremities: WWP, No edema, cyanosis or clubbing; +2 pulses bilaterally MSK: Normal gait and station  Assessment: 69 y.o. male HTN, h/o NSTEMI and Tobacco use.  Plan: See Problem List and After Visit Summary   Janora Norlander, DO PGY-1, [place]

## 2014-09-11 NOTE — Assessment & Plan Note (Signed)
Patient not compliant with Lopressor or Lipitor.  Unaware he was supposed to be taking these medications. -Counseled on importance of BP control as well as prevention of future MI/Stroke. -Patient agrees to take all medications as directed. -Follow up next week for BP check and medication compliance.

## 2014-09-11 NOTE — Assessment & Plan Note (Signed)
Action phase. -Patient challenged to not smoke any cigarettes until I see him next week for follow up. -Patient agrees to challenge. He voices that he can do this. -Counseled on cessation

## 2014-09-14 ENCOUNTER — Encounter: Payer: Self-pay | Admitting: Family Medicine

## 2014-09-18 ENCOUNTER — Ambulatory Visit: Payer: Medicare Other | Admitting: Family Medicine

## 2014-10-19 ENCOUNTER — Encounter (HOSPITAL_COMMUNITY): Payer: Self-pay | Admitting: Emergency Medicine

## 2014-10-19 ENCOUNTER — Inpatient Hospital Stay (HOSPITAL_COMMUNITY): Payer: Medicare Other

## 2014-10-19 ENCOUNTER — Emergency Department (HOSPITAL_COMMUNITY): Payer: Medicare Other

## 2014-10-19 ENCOUNTER — Inpatient Hospital Stay (HOSPITAL_COMMUNITY)
Admission: EM | Admit: 2014-10-19 | Discharge: 2014-10-22 | DRG: 066 | Disposition: A | Discharge status: Home Health | Payer: Medicare Other | Attending: Family Medicine | Admitting: Family Medicine

## 2014-10-19 DIAGNOSIS — R4701 Aphasia: Secondary | ICD-10-CM | POA: Diagnosis not present

## 2014-10-19 DIAGNOSIS — I651 Occlusion and stenosis of basilar artery: Secondary | ICD-10-CM | POA: Diagnosis present

## 2014-10-19 DIAGNOSIS — E119 Type 2 diabetes mellitus without complications: Secondary | ICD-10-CM | POA: Diagnosis present

## 2014-10-19 DIAGNOSIS — R7302 Impaired glucose tolerance (oral): Secondary | ICD-10-CM | POA: Diagnosis not present

## 2014-10-19 DIAGNOSIS — Z7982 Long term (current) use of aspirin: Secondary | ICD-10-CM

## 2014-10-19 DIAGNOSIS — I252 Old myocardial infarction: Secondary | ICD-10-CM

## 2014-10-19 DIAGNOSIS — Z8673 Personal history of transient ischemic attack (TIA), and cerebral infarction without residual deficits: Secondary | ICD-10-CM

## 2014-10-19 DIAGNOSIS — I6330 Cerebral infarction due to thrombosis of unspecified cerebral artery: Secondary | ICD-10-CM | POA: Insufficient documentation

## 2014-10-19 DIAGNOSIS — R4781 Slurred speech: Secondary | ICD-10-CM | POA: Diagnosis not present

## 2014-10-19 DIAGNOSIS — R7309 Other abnormal glucose: Secondary | ICD-10-CM

## 2014-10-19 DIAGNOSIS — R42 Dizziness and giddiness: Secondary | ICD-10-CM

## 2014-10-19 DIAGNOSIS — E669 Obesity, unspecified: Secondary | ICD-10-CM | POA: Diagnosis present

## 2014-10-19 DIAGNOSIS — I672 Cerebral atherosclerosis: Secondary | ICD-10-CM | POA: Diagnosis present

## 2014-10-19 DIAGNOSIS — F17210 Nicotine dependence, cigarettes, uncomplicated: Secondary | ICD-10-CM | POA: Diagnosis present

## 2014-10-19 DIAGNOSIS — G3184 Mild cognitive impairment, so stated: Secondary | ICD-10-CM | POA: Diagnosis not present

## 2014-10-19 DIAGNOSIS — R4189 Other symptoms and signs involving cognitive functions and awareness: Secondary | ICD-10-CM

## 2014-10-19 DIAGNOSIS — Z6827 Body mass index (BMI) 27.0-27.9, adult: Secondary | ICD-10-CM | POA: Diagnosis not present

## 2014-10-19 DIAGNOSIS — I639 Cerebral infarction, unspecified: Principal | ICD-10-CM | POA: Diagnosis present

## 2014-10-19 DIAGNOSIS — I63521 Cerebral infarction due to unspecified occlusion or stenosis of right anterior cerebral artery: Secondary | ICD-10-CM | POA: Diagnosis not present

## 2014-10-19 DIAGNOSIS — E785 Hyperlipidemia, unspecified: Secondary | ICD-10-CM | POA: Diagnosis not present

## 2014-10-19 DIAGNOSIS — I69922 Dysarthria following unspecified cerebrovascular disease: Secondary | ICD-10-CM | POA: Diagnosis not present

## 2014-10-19 DIAGNOSIS — I6523 Occlusion and stenosis of bilateral carotid arteries: Secondary | ICD-10-CM | POA: Diagnosis present

## 2014-10-19 DIAGNOSIS — I6789 Other cerebrovascular disease: Secondary | ICD-10-CM | POA: Diagnosis not present

## 2014-10-19 DIAGNOSIS — I10 Essential (primary) hypertension: Secondary | ICD-10-CM | POA: Diagnosis not present

## 2014-10-19 DIAGNOSIS — I6522 Occlusion and stenosis of left carotid artery: Secondary | ICD-10-CM | POA: Diagnosis not present

## 2014-10-19 DIAGNOSIS — IMO0002 Reserved for concepts with insufficient information to code with codable children: Secondary | ICD-10-CM | POA: Insufficient documentation

## 2014-10-19 DIAGNOSIS — I6322 Cerebral infarction due to unspecified occlusion or stenosis of basilar arteries: Secondary | ICD-10-CM | POA: Diagnosis not present

## 2014-10-19 HISTORY — DX: Non-ST elevation (NSTEMI) myocardial infarction: I21.4

## 2014-10-19 HISTORY — DX: Hyperlipidemia, unspecified: E78.5

## 2014-10-19 HISTORY — DX: Reserved for inherently not codable concepts without codable children: IMO0001

## 2014-10-19 HISTORY — DX: Cerebral infarction, unspecified: I63.9

## 2014-10-19 LAB — DIFFERENTIAL
BASOS PCT: 0 % (ref 0–1)
Basophils Absolute: 0 10*3/uL (ref 0.0–0.1)
EOS PCT: 2 % (ref 0–5)
Eosinophils Absolute: 0.1 10*3/uL (ref 0.0–0.7)
LYMPHS ABS: 2.1 10*3/uL (ref 0.7–4.0)
Lymphocytes Relative: 38 % (ref 12–46)
MONOS PCT: 8 % (ref 3–12)
Monocytes Absolute: 0.5 10*3/uL (ref 0.1–1.0)
Neutro Abs: 3 10*3/uL (ref 1.7–7.7)
Neutrophils Relative %: 52 % (ref 43–77)

## 2014-10-19 LAB — LIPID PANEL
Cholesterol: 255 mg/dL — ABNORMAL HIGH (ref 0–200)
HDL: 41 mg/dL (ref >39)
LDL Cholesterol: 200 mg/dL — ABNORMAL HIGH (ref 0–99)
TRIGLYCERIDES: 71 mg/dL (ref <150)
Total CHOL/HDL Ratio: 6.2 RATIO
VLDL: 14 mg/dL (ref 0–40)

## 2014-10-19 LAB — COMPREHENSIVE METABOLIC PANEL
ALT: 21 U/L (ref 0–53)
AST: 20 U/L (ref 0–37)
Albumin: 3.5 g/dL (ref 3.5–5.2)
Alkaline Phosphatase: 93 U/L (ref 39–117)
Anion gap: 9 (ref 5–15)
BILIRUBIN TOTAL: 0.9 mg/dL (ref 0.3–1.2)
BUN: 7 mg/dL (ref 6–23)
CO2: 27 mmol/L (ref 19–32)
CREATININE: 0.87 mg/dL (ref 0.50–1.35)
Calcium: 9.1 mg/dL (ref 8.4–10.5)
Chloride: 104 mmol/L (ref 96–112)
GFR, EST NON AFRICAN AMERICAN: 87 mL/min — AB (ref >90)
Glucose, Bld: 96 mg/dL (ref 70–99)
POTASSIUM: 3.7 mmol/L (ref 3.5–5.1)
SODIUM: 140 mmol/L (ref 135–145)
TOTAL PROTEIN: 7.3 g/dL (ref 6.0–8.3)

## 2014-10-19 LAB — CBC
HCT: 41.6 % (ref 39.0–52.0)
Hemoglobin: 14.1 g/dL (ref 13.0–17.0)
MCH: 29.2 pg (ref 26.0–34.0)
MCHC: 33.9 g/dL (ref 30.0–36.0)
MCV: 86.1 fL (ref 78.0–100.0)
PLATELETS: 288 10*3/uL (ref 150–400)
RBC: 4.83 MIL/uL (ref 4.22–5.81)
RDW: 14 % (ref 11.5–15.5)
WBC: 5.7 10*3/uL (ref 4.0–10.5)

## 2014-10-19 LAB — URINALYSIS, ROUTINE W REFLEX MICROSCOPIC
BILIRUBIN URINE: NEGATIVE
GLUCOSE, UA: NEGATIVE mg/dL
Hgb urine dipstick: NEGATIVE
Ketones, ur: NEGATIVE mg/dL
LEUKOCYTES UA: NEGATIVE
Nitrite: NEGATIVE
Protein, ur: NEGATIVE mg/dL
Specific Gravity, Urine: 1.017 (ref 1.005–1.030)
UROBILINOGEN UA: 0.2 mg/dL (ref 0.0–1.0)
pH: 6 (ref 5.0–8.0)

## 2014-10-19 LAB — RAPID URINE DRUG SCREEN, HOSP PERFORMED
Amphetamines: NOT DETECTED
Barbiturates: NOT DETECTED
Benzodiazepines: NOT DETECTED
Cocaine: NOT DETECTED
Opiates: NOT DETECTED
Tetrahydrocannabinol: NOT DETECTED

## 2014-10-19 LAB — I-STAT TROPONIN, ED: TROPONIN I, POC: 0 ng/mL (ref 0.00–0.08)

## 2014-10-19 LAB — I-STAT CHEM 8, ED
BUN: 10 mg/dL (ref 6–23)
CALCIUM ION: 1.12 mmol/L — AB (ref 1.13–1.30)
CHLORIDE: 103 mmol/L (ref 96–112)
Creatinine, Ser: 0.9 mg/dL (ref 0.50–1.35)
Glucose, Bld: 93 mg/dL (ref 70–99)
HEMATOCRIT: 48 % (ref 39.0–52.0)
Hemoglobin: 16.3 g/dL (ref 13.0–17.0)
Potassium: 6.5 mmol/L (ref 3.5–5.1)
Sodium: 139 mmol/L (ref 135–145)
TCO2: 26 mmol/L (ref 0–100)

## 2014-10-19 LAB — PROTIME-INR
INR: 1.01 (ref 0.00–1.49)
PROTHROMBIN TIME: 13.4 s (ref 11.6–15.2)

## 2014-10-19 LAB — ETHANOL: Alcohol, Ethyl (B): <5 mg/dL (ref 0–9)

## 2014-10-19 LAB — TSH: TSH: 1.867 u[IU]/mL (ref 0.350–4.500)

## 2014-10-19 LAB — APTT: APTT: 30 s (ref 24–37)

## 2014-10-19 MED ORDER — ASPIRIN 300 MG RE SUPP
300.0000 mg | Freq: Every day | RECTAL | Status: DC
Start: 2014-10-19 — End: 2014-10-20

## 2014-10-19 MED ORDER — POLYETHYLENE GLYCOL 3350 17 G PO PACK
17.0000 g | PACK | Freq: Every day | ORAL | Status: DC
  Administered 2014-10-20 – 2014-10-22 (×3): 17 g via ORAL
  Filled 2014-10-19 (×3): qty 1

## 2014-10-19 MED ORDER — ASPIRIN 325 MG PO TABS
325.0000 mg | ORAL_TABLET | Freq: Every day | ORAL | Status: DC
  Administered 2014-10-19 – 2014-10-20 (×2): 325 mg via ORAL
  Filled 2014-10-19 (×2): qty 1

## 2014-10-19 MED ORDER — ATORVASTATIN CALCIUM 80 MG PO TABS
80.0000 mg | ORAL_TABLET | Freq: Every day | ORAL | Status: DC
  Administered 2014-10-19 – 2014-10-21 (×3): 80 mg via ORAL
  Filled 2014-10-19 (×3): qty 1

## 2014-10-19 MED ORDER — HEPARIN SODIUM (PORCINE) 5000 UNIT/ML IJ SOLN
5000.0000 [IU] | Freq: Three times a day (TID) | INTRAMUSCULAR | Status: DC
  Administered 2014-10-19 – 2014-10-22 (×9): 5000 [IU] via SUBCUTANEOUS
  Filled 2014-10-19 (×9): qty 1

## 2014-10-19 MED ORDER — HYDRALAZINE HCL 20 MG/ML IJ SOLN: 2.0000 mg | Freq: Four times a day (QID) | INTRAMUSCULAR | Status: DC | PRN

## 2014-10-19 MED ORDER — SODIUM CHLORIDE 0.9 % IV SOLN
1.0000 g | Freq: Once | INTRAVENOUS | Status: AC
  Administered 2014-10-19: 1 g via INTRAVENOUS
  Filled 2014-10-19: qty 10

## 2014-10-19 MED ORDER — SODIUM BICARBONATE 8.4 % IV SOLN
50.0000 meq | Freq: Once | INTRAVENOUS | Status: AC
  Administered 2014-10-19: 50 meq via INTRAVENOUS
  Filled 2014-10-19: qty 50

## 2014-10-19 MED ORDER — SODIUM CHLORIDE 0.9 % IV SOLN
INTRAVENOUS | Status: DC
  Administered 2014-10-19: 18:00:00 via INTRAVENOUS

## 2014-10-19 NOTE — ED Notes (Signed)
Pt has returned from being out of the department; pt placed back on monitor, continuous pulse oximetry and blood pressure cuff 

## 2014-10-19 NOTE — H&P (Signed)
[facility] Admission History and Physical Service Pager: [phone]  Patient name: Norman Mendez Medical record number: [id number] Date of birth: 1945/10/01 Age: 69 y.o. Gender: male  Primary Care Provider: Marlou Sa ERIC, MD Consultants: neurology Code Status: Full   Chief Complaint: slurred speech  Assessment and Plan: Norman Mendez is a 69 y.o. male presenting with slurred speech . PMH is significant for NSTEMI, HLD, HTN  Dysarthria:  CT head negative for acute processes.  Chronic ischemic changes are noted. -Admit to FPTS under Dr McDiarmid - Place on telemetry - VS per floor protocol - HgbA1c, fasting lipid panel - MRI, MRA of the brain without contrast - PT consult, OT consult, Speech consult - Echocardiogram and Carotid dopplers - Prophylactic therapy-Antiplatelet med: Aspirin - dose 325mg  daily - NPO until RN stroke swallow screen - Frequent neuro checks - c/s neurology, appreciate rec - Statin, BP control, ASA     - risk stratification: Lipids, TSH, AIC  HTN: BP 169/98 in ED.  On Lisinopril 5mg  & Metoprolol 25mg  BID at home.  There has been issues with compliance with medications previously. - Will want to allow for permissive HTN with concerns for a stroke, until MRI - Will place PRN hydralazine orders with parameters.   H/o NSTEMI: 02/2014. Cath revealing 70-90% proximal to mid stenosis in a nondominant right coronary. The first diagonal of the LAD is small and totally occluded in the midsegment.  Anterolateral wall motion abnormality with LVEF 45-50%.  EKG in ED stable from previous. -Continue ASA, Statin, - will continue HTN meds once MRI r/o stroke.   ?BPH: although not on his history pt home medications state ditropan and finasteride use.  - Will restart tomorrow if MRI negative. Do not want potential BP lowering effects (althogh minimal). Also uncertain of compliance with these medications in outpatient setting. - will monitor urine  output closely.   Tobacco use: smoking 1 cig/day  -Continued smoking cessation counseling   FEN/GI: NPO until nurse stroke swallow then heart healthy diet, 12ml/h NS Prophylaxis: PPI, ASA, hep sq (CT with no acute changes)  Disposition: Admit to telemetry under Dr McDiarmid, DISPO pending clinical improvement and neuro/Pt recommendations.   History of Present Illness: Norman Mendez is a 69 y.o. male presenting with slurred speech that started yesterday morning upon awaking. He states his speech was normal the night before upon going to bed. He waited to seek treatment because he thought it would go away. He was speaking on the phone with his daughter and she noticed some slurred speech and called EMS. He repots being asymptomatic besides slurred speech. He has been able to eat and drink all day without choking. He has noticed no difference in his gait or coordination. He denies chest pain, palpitations, nausea, vomit or headache. He reports a history of prior stroke, he "thinks" and does not recall if he had residuals effects from it or of his facial droop today is new.   Review Of Systems: Per HPI with the following additions: None Otherwise 12 point review of systems was performed and was unremarkable.  Patient Active Problem List   Diagnosis Date Noted  . Slurred speech 10/19/2014  . Essential hypertension 09/11/2014  . Hyperlipidemia 03/18/2014  . Non-ST elevation myocardial infarction (NSTEMI) 03/17/2014  . Transaminitis 11/17/2011  . Chest pain 11/16/2011  . Sinus tachycardia 11/16/2011  . Hiccup 11/16/2011  . Dizziness - light-headed 11/16/2011  . Hyponatremia 11/16/2011  . Tobacco use 11/16/2011   Past Medical History: Past  Medical History  Diagnosis Date  . Hypertension   . CVA (cerebral infarction)   . Hyperlipemia   . NSTEMI (non-ST elevated myocardial infarction)   . Shortness of breath dyspnea    Past Surgical History: Past Surgical History  Procedure Laterality  Date  . No previous surgery    . Left heart catheterization with coronary angiogram N/A 03/18/2014    Procedure: LEFT HEART CATHETERIZATION WITH CORONARY ANGIOGRAM;  Surgeon: Sinclair Grooms, MD;  Location[facility] C[facility] LAB;  Service: Cardiovascular;  Laterality: N/A;   Social History: History  Substance Use Topics  . Smoking status: Former Smoker -- 0.20 packs/day    Types: Cigarettes    Quit date: 08/18/2014  . Smokeless tobacco: Never Used  . Alcohol Use: No     Comment: 3 40 ounce beer per week   Additional social history: continues to smoke 1 cigarette a day Please also refer to relevant sections of EMR.  Family History: Family History  Problem Relation Age of Onset  . Heart disease      No family history   Allergies and Medications: No Known Allergies No current facility-administered medications on file prior to encounter.   Current Outpatient Prescriptions on File Prior to Encounter  Medication Sig Dispense Refill  . aspirin 81 MG chewable tablet Chew 1 tablet (81 mg total) by mouth daily.    Marland Kitchen atorvastatin (LIPITOR) 80 MG tablet Take 1 tablet (80 mg total) by mouth daily at 6 PM. 30 tablet 5  . finasteride (PROSCAR) 5 MG tablet   0  . lisinopril (PRINIVIL,ZESTRIL) 5 MG tablet Take 1 tablet (5 mg total) by mouth daily. 30 tablet 5  . metoprolol tartrate (LOPRESSOR) 25 MG tablet Take 1 tablet (25 mg total) by mouth 2 (two) times daily. 60 tablet 5  . oxybutynin (DITROPAN) 5 MG tablet   1  . nitroGLYCERIN (NITROSTAT) 0.4 MG SL tablet Place 1 tablet (0.4 mg total) under the tongue every 5 (five) minutes x 3 doses as needed for chest pain. 25 tablet 12    Objective: BP 197/110 mmHg  Pulse 86  Temp(Src) 98.1 F (36.7 C) (Oral)  Resp 20  Ht 5\' 6"  (1.676 m)  Wt 173 lb (78.472 kg)  BMI 27.94 kg/m2  SpO2 99% Exam: Gen: NAD. Lying in bed.  HEENT: AT. [place].  Bilateral eyes without injections or icterus. Mildly dry mucous membranes.  CV: RRR, 1/6 SM appreciated Chest: CTAB,  no wheeze or crackles Abd: Soft. round. NTND. BS present.  Ext: No erythema. No edema. Non tender bilateral. Negative homans.  Skin: No rashes, purpura or petechiae.  Neuro:  PERLA. EOMi. Alert. Oriented. Mild slurred speech. Left facial droop (?chronic), No pronator drift. Otherwise neurologically intact. 5/5 UE/LE strength.   Labs and Imaging: CBC BMET   Recent Labs Lab 10/19/14 1120 10/19/14 1430  WBC 5.7  --   HGB 14.1 16.3  HCT 41.6 48.0  PLT 288  --     Recent Labs Lab 10/19/14 1545  NA 140  K 3.7  CL 104  CO2 27  BUN 7  CREATININE 0.87  GLUCOSE 96  CALCIUM 9.1     Ct Head Wo Contrast  10/19/2014   CLINICAL DATA:  Slurred speech for 1 day  EXAM: CT HEAD WITHOUT CONTRAST  TECHNIQUE: Contiguous axial images were obtained from the base of the skull through the vertex without intravenous contrast.  COMPARISON:  11/16/2011  FINDINGS: Chronic ischemic changes in the periventricular white matter are  noted. New chronic appearing infarcts have developed in the left caudate head extending into the anterior internal capsule. New infarct in the left corona radiata. New are infarct in the right internal capsule extending into the right corona radiata. There is no mass effect, midline shift, or acute hemorrhage. Mastoid air cells are clear. Cranium is intact.  IMPRESSION: Chronic ischemic changes are noted. There are new bilateral infarcts which have a chronic appearance. This is in comparison to the prior study from 2013.   Electronically Signed   By: Marybelle Killings M.D.   On: 10/19/2014 13:58   [place], [place] PGY-3, [place] Intern pager: [phone], text pages welcome

## 2014-10-19 NOTE — ED Notes (Signed)
Pt undressed, in gown, on monitor, continuous pulse oximetry and blood pressure cuff 

## 2014-10-19 NOTE — ED Provider Notes (Signed)
CSN: [id number]     Arrival date & time 10/19/14  1242 History   First MD Initiated Contact with Patient 10/19/14 1243     Chief Complaint  Patient presents with  . Aphasia     (Consider location/radiation/quality/duration/timing/severity/associated sxs/prior Treatment) The history is provided by the patient and the EMS personnel.  Norman Mendez is a 69 y.o. male history of hypertension, stroke with right-sided weakness here presenting with slurred speech. Slurred speech since 11 AM yesterday. He thought it will go away so he did not call EMS until today. Denies worsening weakness or numbness. Denies chest pain or shortness breath.    Level V caveat- slurred speech   Past Medical History  Diagnosis Date  . Hypertension   . CVA (cerebral infarction)   . Hyperlipemia    Past Surgical History  Procedure Laterality Date  . No previous surgery    . Left heart catheterization with coronary angiogram N/A 03/18/2014    Procedure: LEFT HEART CATHETERIZATION WITH CORONARY ANGIOGRAM;  Surgeon: Sinclair Grooms, MD;  Location: [facility] CATH LAB;  Service: Cardiovascular;  Laterality: N/A;   Family History  Problem Relation Age of Onset  . Heart disease      No family history   History  Substance Use Topics  . Smoking status: Former Smoker -- 0.20 packs/day    Types: Cigarettes  . Smokeless tobacco: Not on file  . Alcohol Use: No     Comment: 3 40 ounce beer per week    Review of Systems  Neurological: Positive for speech difficulty.  All other systems reviewed and are negative.     Allergies  Review of patient's allergies indicates no known allergies.  Home Medications   Prior to Admission medications   Medication Sig Start Date End Date Taking? Authorizing Provider  aspirin 81 MG chewable tablet Chew 1 tablet (81 mg total) by mouth daily. 09/11/14  Yes Ashly M Gottschalk, DO  atorvastatin (LIPITOR) 80 MG tablet Take 1 tablet (80 mg total) by mouth daily at 6 PM. 09/11/14  Yes  Ashly Windell Moulding, DO  finasteride (PROSCAR) 5 MG tablet  06/25/14  Yes Historical Provider, MD  lisinopril (PRINIVIL,ZESTRIL) 5 MG tablet Take 1 tablet (5 mg total) by mouth daily. 09/11/14  Yes Ashly Windell Moulding, DO  metoprolol tartrate (LOPRESSOR) 25 MG tablet Take 1 tablet (25 mg total) by mouth 2 (two) times daily. 09/11/14  Yes Janora Norlander, DO  oxybutynin (DITROPAN) 5 MG tablet  08/13/14  Yes Historical Provider, MD  nitroGLYCERIN (NITROSTAT) 0.4 MG SL tablet Place 1 tablet (0.4 mg total) under the tongue every 5 (five) minutes x 3 doses as needed for chest pain. 03/20/14   Brett Canales, PA-C   BP 161/84 mmHg  Pulse 81  Temp(Src) 97.6 F (36.4 C) (Oral)  Resp 20  Ht 5\' 6"  (1.676 m)  Wt 173 lb (78.472 kg)  BMI 27.94 kg/m2  SpO2 99% Physical Exam  Constitutional:  Chronically ill   HENT:  Head: Normocephalic.  Mouth/Throat: Oropharynx is clear and moist.  Eyes: Conjunctivae are normal. Pupils are equal, round, and reactive to light.  Neck: Normal range of motion. Neck supple.  Cardiovascular: Normal rate, regular rhythm and intact distal pulses.   Pulmonary/Chest: Effort normal and breath sounds normal. No respiratory distress. He has no wheezes. He has no rales.  Abdominal: Soft. Bowel sounds are normal. He exhibits no distension. There is no tenderness. There is no rebound.  Musculoskeletal: Normal range of  motion. He exhibits no edema or tenderness.  Neurological:  Facial droop L side (chronic). Strength 4/5 R side (chronic). 5/5 otherwise   Skin: Skin is warm and dry.  Psychiatric: He has a normal mood and affect. His behavior is normal. Judgment and thought content normal.  Nursing note and vitals reviewed.   ED Course  Procedures (including critical care time) Labs Review Labs Reviewed  I-STAT CHEM 8, ED - Abnormal; Notable for the following:    Potassium 6.5 (*)    Calcium, Ion 1.12 (*)    All other components within normal limits  PROTIME-INR  APTT  CBC   DIFFERENTIAL  ETHANOL  URINE RAPID DRUG SCREEN (HOSP PERFORMED)  URINALYSIS, ROUTINE W REFLEX MICROSCOPIC  COMPREHENSIVE METABOLIC PANEL  I-STAT TROPOININ, ED  I-STAT TROPOININ, ED    Imaging Review Ct Head Wo Contrast  10/19/2014   CLINICAL DATA:  Slurred speech for 1 day  EXAM: CT HEAD WITHOUT CONTRAST  TECHNIQUE: Contiguous axial images were obtained from the base of the skull through the vertex without intravenous contrast.  COMPARISON:  11/16/2011  FINDINGS: Chronic ischemic changes in the periventricular white matter are noted. New chronic appearing infarcts have developed in the left caudate head extending into the anterior internal capsule. New infarct in the left corona radiata. New are infarct in the right internal capsule extending into the right corona radiata. There is no mass effect, midline shift, or acute hemorrhage. Mastoid air cells are clear. Cranium is intact.  IMPRESSION: Chronic ischemic changes are noted. There are new bilateral infarcts which have a chronic appearance. This is in comparison to the prior study from 2013.   Electronically Signed   By: Marybelle Killings M.D.   On: 10/19/2014 13:58     EKG Interpretation   Date/Time:  Monday October 19 2014 12:50:35 EST Ventricular Rate:  89 PR Interval:  146 QRS Duration: 82 QT Interval:  370 QTC Calculation: 450 R Axis:   16 Text Interpretation:  Sinus rhythm Posterior infarct, old Abnormal T,  consider ischemia, lateral leads No significant change since last tracing  Confirmed by YAO  MD, DAVID ([id number]) on 10/19/2014 1:22:27 PM      MDM   Final diagnoses:  None    Norman Mendez is a 69 y.o. male here with slurred speech. Outside window for TPA. Will get labs, ct head, neurology consult. Will likely need admission.   3:14 PM K 6.5. ? Hemolyzed. Subtle EKG changes so will give bicarb, calcium. Will repeat K. CT head unchanged. Will admit to tele.     Wandra Arthurs, MD 10/19/14 [phone]

## 2014-10-19 NOTE — ED Notes (Signed)
Chem 8 results given to Dr. Darl Householder

## 2014-10-19 NOTE — Consult Note (Signed)
Referring Physician: Darl Householder    Chief Complaint: slurred speech  HPI:                                                                                                                                         Norman Mendez is an 69 y.o. male with known hyperlipidemia, non STEMI, CVA (no known date and states no residual symptoms) and HTN.  Patient felt normal on the night of 10/17/2014 at the time he went to sleep.  He awoke the next morning and noted his speech was slurred.  He denies any other symptoms.  He did not notify anyone as he thought it may get better.  Today he was talking with his daughter on the phone and she also noted the speech was slurred.  EMS was called to his apartment and he was brought to ED.  Initial CT head shows no acute infarct but does note new bilateral infarcts which appear chronic when compared to previous CT scans. Currently he has no complaints other than slurred speech. He states he takes ASA daily and does not miss a dose.   Date last known well: Date: 10/17/2014 Time last known well: Time: 23:00 tPA Given: No: out of window  Past Medical History  Diagnosis Date  . Hypertension   . CVA (cerebral infarction)   . Hyperlipemia     Past Surgical History  Procedure Laterality Date  . No previous surgery    . Left heart catheterization with coronary angiogram N/A 03/18/2014    Procedure: LEFT HEART CATHETERIZATION WITH CORONARY ANGIOGRAM;  Surgeon: Sinclair Grooms, MD;  Location: [facility] CATH LAB;  Service: Cardiovascular;  Laterality: N/A;    Family History  Problem Relation Age of Onset  . Heart disease      No family history   Social History:  reports that he has quit smoking. His smoking use included Cigarettes. He smoked 0.20 packs per day. He does not have any smokeless tobacco history on file. He reports that he does not drink alcohol or use illicit drugs.  Allergies: No Known Allergies  Medications:                                                                                                                            No current facility-administered medications for this encounter.   Current Outpatient Prescriptions  Medication Sig Dispense Refill  . aspirin 81 MG chewable tablet Chew 1 tablet (81 mg total) by mouth daily.    Marland Kitchen atorvastatin (LIPITOR) 80 MG tablet Take 1 tablet (80 mg total) by mouth daily at 6 PM. 30 tablet 5  . finasteride (PROSCAR) 5 MG tablet   0  . lisinopril (PRINIVIL,ZESTRIL) 5 MG tablet Take 1 tablet (5 mg total) by mouth daily. 30 tablet 5  . metoprolol tartrate (LOPRESSOR) 25 MG tablet Take 1 tablet (25 mg total) by mouth 2 (two) times daily. 60 tablet 5  . nitroGLYCERIN (NITROSTAT) 0.4 MG SL tablet Place 1 tablet (0.4 mg total) under the tongue every 5 (five) minutes x 3 doses as needed for chest pain. 25 tablet 12  . oxybutynin (DITROPAN) 5 MG tablet   1     ROS:                                                                                                                                       History obtained from the patient  General ROS: negative for - chills, fatigue, fever, night sweats, weight gain or weight loss Psychological ROS: negative for - behavioral disorder, hallucinations, memory difficulties, mood swings or suicidal ideation Ophthalmic ROS: negative for - blurry vision, double vision, eye pain or loss of vision ENT ROS: negative for - epistaxis, nasal discharge, oral lesions, sore throat, tinnitus or vertigo Allergy and Immunology ROS: negative for - hives or itchy/watery eyes Hematological and Lymphatic ROS: negative for - bleeding problems, bruising or swollen lymph nodes Endocrine ROS: negative for - galactorrhea, hair pattern changes, polydipsia/polyuria or temperature intolerance Respiratory ROS: negative for - cough, hemoptysis, shortness of breath or wheezing Cardiovascular ROS: negative for - chest pain, dyspnea on exertion, edema or irregular heartbeat Gastrointestinal ROS: negative  for - abdominal pain, diarrhea, hematemesis, nausea/vomiting or stool incontinence Genito-Urinary ROS: negative for - dysuria, hematuria, incontinence or urinary frequency/urgency Musculoskeletal ROS: negative for - joint swelling or muscular weakness Neurological ROS: as noted in HPI Dermatological ROS: negative for rash and skin lesion changes  Neurologic Examination:                                                                                                      Blood pressure 168/78, pulse 81, temperature 97.6 F (36.4 C), temperature source Oral, resp. rate 20, height 5\' 6"  (1.676 m), weight 78.472 kg (173 lb), SpO2 98 %.  HEENT-  Normocephalic, no lesions, without obvious abnormality.  Normal external eye and conjunctiva.  Normal TM's bilaterally.  Normal auditory canals and external ears. Normal external nose, mucus membranes and septum.  Normal pharynx. Cardiovascular- S1, S2 normal, pulses palpable throughout   Lungs- chest clear, no wheezing, rales, normal symmetric air entry Abdomen- normal findings: bowel sounds normal Extremities- no edema Lymph-no adenopathy palpable Musculoskeletal-no joint tenderness, deformity or swelling Skin-warm and dry, no hyperpigmentation, vitiligo, or suspicious lesions  Neurological Examination Mental Status: Alert, oriented, thought content appropriate.  Speech dysarthric without evidence of aphasia.  Able to follow 3 step commands without difficulty. Cranial Nerves: II: Discs flat bilaterally; Visual fields grossly normal, pupils equal, round, reactive to light and accommodation III,IV, VI: ptosis not present, extra-ocular motions intact bilaterally V,VII: smile asymmetric with left facial droop[, facial light touch sensation normal bilaterally VIII: hearing normal bilaterally IX,X: gag reflex present XI: bilateral shoulder shrug XII: midline tongue extension Motor: Right : Upper extremity   5/5    Left:     Upper extremity   5/5  Lower  extremity   5/5     Lower extremity   5/5 --non drift noted in UE or LE Tone and bulk:normal tone throughout; no atrophy noted Sensory: Pinprick and light touch intact throughout, bilaterally Deep Tendon Reflexes: 2+ and symmetric throughout UE 1+ bilateral KJ and no AJ Plantars: Right: downgoing   Left: downgoing Cerebellar: normal finger-to-nose, and normal heel-to-shin test Gait: not tested due to multiple leads.        Lab Results: Basic Metabolic Panel: No results for input(s): NA, K, CL, CO2, GLUCOSE, BUN, CREATININE, CALCIUM, MG, PHOS in the last 168 hours.  Liver Function Tests: No results for input(s): AST, ALT, ALKPHOS, BILITOT, PROT, ALBUMIN in the last 168 hours. No results for input(s): LIPASE, AMYLASE in the last 168 hours. No results for input(s): AMMONIA in the last 168 hours.  CBC: No results for input(s): WBC, NEUTROABS, HGB, HCT, MCV, PLT in the last 168 hours.  Cardiac Enzymes: No results for input(s): CKTOTAL, CKMB, CKMBINDEX, TROPONINI in the last 168 hours.  Lipid Panel: No results for input(s): CHOL, TRIG, HDL, CHOLHDL, VLDL, LDLCALC in the last 168 hours.  CBG: No results for input(s): GLUCAP in the last 168 hours.  Microbiology: Results for orders placed or performed during the hospital encounter of 03/17/14  Culture, Urine     Status: None   Collection Time: 03/17/14  3:52 PM  Result Value Ref Range Status   Specimen Description URINE, CLEAN CATCH  Final   Special Requests NONE  Final   Culture  Setup Time   Final    03/18/2014 04:02 Performed at [facility]   Final    >=100,000 COLONIES/ML Performed at [facility] Performed at [facility]   Report Status 03/20/2014 FINAL  Final   Organism ID, Bacteria KLEBSIELLA PNEUMONIAE  Final      Susceptibility   Klebsiella pneumoniae - MIC*    AMPICILLIN >=32 RESISTANT Resistant     CEFAZOLIN <=4 SENSITIVE  Sensitive     CEFTRIAXONE <=1 SENSITIVE Sensitive     CIPROFLOXACIN <=0.25 SENSITIVE Sensitive     GENTAMICIN <=1 SENSITIVE Sensitive     LEVOFLOXACIN <=0.12 SENSITIVE Sensitive     NITROFURANTOIN 32 SENSITIVE Sensitive     TOBRAMYCIN <=1 SENSITIVE Sensitive     TRIMETH/SULFA <=20 SENSITIVE Sensitive     PIP/TAZO <=4 SENSITIVE Sensitive     *  KLEBSIELLA PNEUMONIAE  MRSA PCR Screening     Status: None   Collection Time: 03/17/14  8:37 PM  Result Value Ref Range Status   MRSA by PCR NEGATIVE NEGATIVE Final    Comment:        The GeneXpert MRSA Assay (FDA approved for NASAL specimens only), is one component of a comprehensive MRSA colonization surveillance program. It is not intended to diagnose MRSA infection nor to guide or monitor treatment for MRSA infections.    Coagulation Studies: No results for input(s): LABPROT, INR in the last 72 hours.  Imaging: Ct Head Wo Contrast  10/19/2014   CLINICAL DATA:  Slurred speech for 1 day  EXAM: CT HEAD WITHOUT CONTRAST  TECHNIQUE: Contiguous axial images were obtained from the base of the skull through the vertex without intravenous contrast.  COMPARISON:  11/16/2011  FINDINGS: Chronic ischemic changes in the periventricular white matter are noted. New chronic appearing infarcts have developed in the left caudate head extending into the anterior internal capsule. New infarct in the left corona radiata. New are infarct in the right internal capsule extending into the right corona radiata. There is no mass effect, midline shift, or acute hemorrhage. Mastoid air cells are clear. Cranium is intact.  IMPRESSION: Chronic ischemic changes are noted. There are new bilateral infarcts which have a chronic appearance. This is in comparison to the prior study from 2013.   Electronically Signed   By: Marybelle Killings M.D.   On: 10/19/2014 13:58    Etta Quill PA-C [facility] [phone]  10/19/2014, 2:24 PM   Patient seen and examined.   Clinical course and management discussed.  Necessary edits performed.  I agree with the above.  Assessment and plan of care developed and discussed below.     Assessment: 69 y.o. male presenting to ED with slurred speech which has been present since waking on 10/18/2014.  Neurological examination significant for both left facial droop and dysarthria.  Head CT reviewed and shows no acute changes.  Multiple areas of chronic lacunar changes noted.  Can not rule out an acute ischemic event.  Further work up recommended.  Stroke Risk Factors - hyperlipidemia and hypertension  Recommendations: 1. HgbA1c, fasting lipid panel 2. MRI, MRA  of the brain without contrast 3. PT consult, OT consult, Speech consult 4. Echocardiogram 5. Carotid dopplers 6. Prophylactic therapy-Antiplatelet med: Aspirin - dose 325mg  daily 7. NPO until RN stroke swallow screen 8. Telemetry monitoring 9. Frequent neuro checks   Alexis Goodell, MD [facility] [phone]  10/19/2014  3:53 PM

## 2014-10-19 NOTE — ED Notes (Signed)
Per ems pt noticed slurred speech yesterday around 1100. Called daughter this morning and she called EMS. Pt is alert and oriented x4.

## 2014-10-19 NOTE — ED Notes (Signed)
CMP redrawn and sent and lab called back and stated blood has hemolyzed again. Blood to be redrawn

## 2014-10-20 DIAGNOSIS — I10 Essential (primary) hypertension: Secondary | ICD-10-CM

## 2014-10-20 DIAGNOSIS — R7302 Impaired glucose tolerance (oral): Secondary | ICD-10-CM

## 2014-10-20 DIAGNOSIS — I6330 Cerebral infarction due to thrombosis of unspecified cerebral artery: Secondary | ICD-10-CM

## 2014-10-20 DIAGNOSIS — G3184 Mild cognitive impairment, so stated: Secondary | ICD-10-CM

## 2014-10-20 DIAGNOSIS — I69922 Dysarthria following unspecified cerebrovascular disease: Secondary | ICD-10-CM

## 2014-10-20 DIAGNOSIS — R4701 Aphasia: Secondary | ICD-10-CM

## 2014-10-20 DIAGNOSIS — I6789 Other cerebrovascular disease: Secondary | ICD-10-CM

## 2014-10-20 DIAGNOSIS — E785 Hyperlipidemia, unspecified: Secondary | ICD-10-CM

## 2014-10-20 HISTORY — DX: Cerebral infarction due to thrombosis of unspecified cerebral artery: I63.30

## 2014-10-20 LAB — CBC WITH DIFFERENTIAL/PLATELET
BASOS ABS: 0 10*3/uL (ref 0.0–0.1)
BASOS PCT: 1 % (ref 0–1)
Eosinophils Absolute: 0.2 10*3/uL (ref 0.0–0.7)
Eosinophils Relative: 3 % (ref 0–5)
HCT: 41.5 % (ref 39.0–52.0)
Hemoglobin: 13.8 g/dL (ref 13.0–17.0)
LYMPHS ABS: 2.3 10*3/uL (ref 0.7–4.0)
LYMPHS PCT: 41 % (ref 12–46)
MCH: 29.5 pg (ref 26.0–34.0)
MCHC: 33.3 g/dL (ref 30.0–36.0)
MCV: 88.7 fL (ref 78.0–100.0)
Monocytes Absolute: 0.4 10*3/uL (ref 0.1–1.0)
Monocytes Relative: 7 % (ref 3–12)
NEUTROS PCT: 48 % (ref 43–77)
Neutro Abs: 2.8 10*3/uL (ref 1.7–7.7)
Platelets: 231 10*3/uL (ref 150–400)
RBC: 4.68 MIL/uL (ref 4.22–5.81)
RDW: 14.1 % (ref 11.5–15.5)
WBC: 5.7 10*3/uL (ref 4.0–10.5)

## 2014-10-20 LAB — COMPREHENSIVE METABOLIC PANEL
ALBUMIN: 3.3 g/dL — AB (ref 3.5–5.2)
ALK PHOS: 85 U/L (ref 39–117)
ALT: 15 U/L (ref 0–53)
ANION GAP: 3 — AB (ref 5–15)
AST: 15 U/L (ref 0–37)
BUN: 8 mg/dL (ref 6–23)
CO2: 30 mmol/L (ref 19–32)
Calcium: 8.9 mg/dL (ref 8.4–10.5)
Chloride: 105 mmol/L (ref 96–112)
Creatinine, Ser: 0.92 mg/dL (ref 0.50–1.35)
GFR calc Af Amer: >90 mL/min (ref >90)
GFR calc non Af Amer: 85 mL/min — ABNORMAL LOW (ref >90)
Glucose, Bld: 108 mg/dL — ABNORMAL HIGH (ref 70–99)
POTASSIUM: 3.9 mmol/L (ref 3.5–5.1)
SODIUM: 138 mmol/L (ref 135–145)
TOTAL PROTEIN: 6.9 g/dL (ref 6.0–8.3)
Total Bilirubin: 1.2 mg/dL (ref 0.3–1.2)

## 2014-10-20 MED ORDER — LISINOPRIL 5 MG PO TABS
5.0000 mg | ORAL_TABLET | Freq: Every day | ORAL | Status: DC
  Administered 2014-10-20: 5 mg via ORAL
  Filled 2014-10-20: qty 1

## 2014-10-20 MED ORDER — ASPIRIN EC 81 MG PO TBEC
81.0000 mg | DELAYED_RELEASE_TABLET | Freq: Every day | ORAL | Status: DC
  Administered 2014-10-21 – 2014-10-22 (×2): 81 mg via ORAL
  Filled 2014-10-20 (×3): qty 1

## 2014-10-20 MED ORDER — PERFLUTREN LIPID MICROSPHERE
1.0000 mL | INTRAVENOUS | Status: DC | PRN
Start: 2014-10-20 — End: 2014-10-20
  Administered 2014-10-20: 4 mL via INTRAVENOUS
  Filled 2014-10-20: qty 10

## 2014-10-20 MED ORDER — CLOPIDOGREL BISULFATE 75 MG PO TABS
75.0000 mg | ORAL_TABLET | Freq: Every day | ORAL | Status: DC
  Administered 2014-10-20 – 2014-10-22 (×3): 75 mg via ORAL
  Filled 2014-10-20 (×3): qty 1

## 2014-10-20 MED ORDER — METOPROLOL TARTRATE 25 MG PO TABS
25.0000 mg | ORAL_TABLET | Freq: Two times a day (BID) | ORAL | Status: DC
  Administered 2014-10-20 – 2014-10-22 (×4): 25 mg via ORAL
  Filled 2014-10-20 (×4): qty 1

## 2014-10-20 NOTE — Discharge Summary (Signed)
[facility] Discharge Summary  Patient name: Norman Mendez Medical record number: [id number] Date of birth: 1945/11/18 Age: 69 y.o. Gender: male Date of Admission: 10/19/2014  Date of Discharge: 10/22/14 Admitting Physician: Blane Ohara McDiarmid, MD  Primary Care Provider: Janora Norlander, DO Consultants: neurology  Indication for Hospitalization: slurred speech  Discharge Diagnoses/Problem List:  Aphasia Slurred speech Central thrombosis with cerebral infarction HTN HLD  Disposition: Discharge home with daughter and [facility].  Discharge Condition: Stable  Discharge Exam:  BP 146/87 mmHg  Pulse 80  Temp(Src) 98.1 F (36.7 C) (Oral)  Resp 18  Ht 5\' 6"  (1.676 m)  Wt 173 lb (78.472 kg)  BMI 27.94 kg/m2  SpO2 100%  Gen: awake, alert, NAD, lying in bed HENT: [place]/AT, EOMI, o/p clear, poor dentition Cardio: RRR, no m/r/g Pulm: CTAB, no increased WOB, breathing on RA Abd: flat, soft, NT/ND, +BS Ext: WWP, no edema Neuro: relatively unchanged: L sided facial droop, speaks from R side of mouth, slurred speech, otherwise CN seem to be intact, sensation grossly in tact, 5/5 strength b/l (although L feels slightly stronger than R side)  Brief Hospital Course:  Norman Mendez is a 69 y.o. male that presented with slurred speech . PMH is significant for NSTEMI, HLD, HTN.  In ED, CT head showed a new infarct from previous studies in the L corona radiata and in the right internal capsule extending to the corona radiata.  Patient was admitted to [facility] for further management.  Initially, his BP medications were held and permissive HTN was allowed.  Once 48 hours from last know "normal", patient was restarted on his home Metoprolol and Lisinopril.  Neurology/Stroke team was consulted who recommended that patient have MRI/MRA brain done.  This was remarkable for an acute R basal ganglia infarct and high grade stenosis of multiple vessels, and findings consistent with  atherosclerosis of intracranial vessels. Neurology recommended that patient be continued on aggressive risk factor modification as he was at risk for recurrent stroke and TIA.  2D echo showed EF 60-65%, with grade 1 diastolic dysfunction.  Carotid dopplers was unremarkable.  Lipid panel LDL 200.  A1c 6.3. TSH 1.867.  Patient was evaluated by PT/OT/SLP who recommended that patient be set up with [facility] to aid with rehabilitation at home vs SNF.  These options were discussed beside with daughter and it was decided that patient would move in with daughter and pursue HH.  Patient was discharged home in stable condition with HH, a rolling walker and 3-1 bedside commode.  Issues for Follow Up:  1. Compliance with BP and HLD medications 2. Consider Repatha or Praluent (injectable statins) 3. ASA + Plavix for 3 months post CVA (stop some time in May) 4. Lipid, LDL goal <70. ON Lipitor 80 5. A1c, goal <7.0. 6.3 in hospital.  Lifestyle  Modifications for now.  Consider adding Metformin. 6. Continued smoking cessation  Significant Procedures: none  Significant Labs and Imaging:   Recent Labs Lab 10/19/14 1120 10/19/14 1430 10/20/14 0438  WBC 5.7  --  5.7  HGB 14.1 16.3 13.8  HCT 41.6 48.0 41.5  PLT 288  --  231    Recent Labs Lab 10/19/14 1430 10/19/14 1545 10/20/14 0438  NA 139 140 138  K 6.5* 3.7 3.9  CL 103 104 105  CO2  --  27 30  GLUCOSE 93 96 108*  BUN 10 7 8   CREATININE 0.90 0.87 0.92  CALCIUM  --  9.1 8.9  ALKPHOS  --  93 85  AST  --  20 15  ALT  --  21 15  ALBUMIN  --  3.5 3.3*   Lipid Panel     Component Value Date/Time   CHOL 255* 10/19/2014 1900   TRIG 71 10/19/2014 1900   HDL 41 10/19/2014 1900   CHOLHDL 6.2 10/19/2014 1900   VLDL 14 10/19/2014 1900   LDLCALC 200* 10/19/2014 1900     Carotid doppler: Bilateral: 1-39% ICA stenosis. Vertebral artery flow is antegrade.   2D Echo:  Study Conclusions - Left ventricle: The cavity size was normal. Wall thickness  was increased in a pattern of mild LVH. Systolic function was normal. The estimated ejection fraction was in the range of 60% to 65%. Wall motion was normal; there were no regional wall motion abnormalities. Doppler parameters are consistent with abnormal left ventricular relaxation (grade 1 diastolic dysfunction).  Ct Head Wo Contrast  10/19/2014   CLINICAL DATA:  Slurred speech for 1 day  EXAM: CT HEAD WITHOUT CONTRAST  TECHNIQUE: Contiguous axial images were obtained from the base of the skull through the vertex without intravenous contrast.  COMPARISON:  11/16/2011  FINDINGS: Chronic ischemic changes in the periventricular white matter are noted. New chronic appearing infarcts have developed in the left caudate head extending into the anterior internal capsule. New infarct in the left corona radiata. New are infarct in the right internal capsule extending into the right corona radiata. There is no mass effect, midline shift, or acute hemorrhage. Mastoid air cells are clear. Cranium is intact.  IMPRESSION: Chronic ischemic changes are noted. There are new bilateral infarcts which have a chronic appearance. This is in comparison to the prior study from 2013.   Electronically Signed   By: Marybelle Killings M.D.   On: 10/19/2014 13:58   [name] Head Wo Contrast  10/19/2014   CLINICAL DATA:  Slurred speech after awakening yesterday morning. No focal neurological symptoms. History of hypertension, stroke, hyperlipidemia, dizziness.  EXAM: MRI HEAD WITHOUT CONTRAST  MRA HEAD WITHOUT CONTRAST  TECHNIQUE: Multiplanar, multiecho pulse sequences of the brain and surrounding structures were obtained without intravenous contrast. Angiographic images of the head were obtained using MRA technique without contrast.  COMPARISON:  CT of the head October 19, 2014 at 1323 hours  FINDINGS: MRI HEAD FINDINGS  13 x 33 mm patchy reduced diffusion in RIGHT basal ganglia, corresponding low ADC values. Faint susceptibility  artifact and LEFT basal ganglia corresponding to prior infarct. Bilateral basal ganglia mineralization. Multiple punctate foci of susceptibility artifact within LEFT occipital lobe.  Ventricles and sulci are normal for patient's age. Confluent supratentorial white matter T2 hyperintensities without mass effect or midline shift. Multiple tiny T2 hyperintensities in the Mendez. Remote bilateral basal ganglia and thalamus lacunar infarcts.  No abnormal extra-axial fluid collections. Ocular globes and orbital contents are unremarkable. Mild paranasal sinus mucosal thickening without air-fluid levels. Mastoid air cells appear well-aerated. No abnormal sellar expansion. No cerebellar tonsillar ectopia. No suspicious calvarial bone marrow signal. Patient appears edentulous.  MRA HEAD FINDINGS  Anterior circulation: Normal flow related enhancement of the included cervical, petrous internal carotid arteries. Mid to high-grade stenosis of the proximal RIGHT cavernous internal carotid artery, luminal irregularity of the distal cavernous and supraclinoid internal carotid artery. Mild luminal irregularity of the cavernous LEFT internal carotid artery with at least moderate may grade stenosis the anterior genu. Mid to high-grade stenosis of the origin of the RIGHT M1 segment, moderate luminal irregularity LEFT M1 segment.  Congenitally dominant  LEFT A1 segment, patent anterior communicating artery. Tiny RIGHT A1 segment, with distal tapering. 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 High-grade stenosis of the RIGHT proximal A2 segment, with poststenotic dilatation and luminal irregularity distally. At least midgrade stenosis of proximal LEFT A2 segment.  No large vessel occlusion.  Posterior circulation: RIGHT vertebral artery is dominant. Absent LEFT vertebral artery flow void, with retrograde flow into distal LEFT V4 segment. Patent, moderate narrowing of the proximal basilar artery. Mild luminal irregularity of the bilateral P1  segments, high-grade stenosis versus occlusion of the proximal RIGHT greater than LEFT P2 segments with suspected collateralization. Luminal irregularity of the mid to distal posterior cerebral artery segments.  IMPRESSION: MRI HEAD: Acute RIGHT basal ganglia infarct.  Remote bilateral basal ganglia and thalamus lacunar infarcts. Severe white matter changes suggest chronic small vessel ischemic disease.  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  Mid high-grade stenosis of RIGHT M1 segment, moderate LEFT M1 segment stenosis.  High-grade stenosis of RIGHT proximal A2 segment, mid grade stenosis of proximal LEFT A2 segment.  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  High-grade stenosis versus focal occlusion of the bilateral mid to distal P2 segments with suspected collateralization. Moderate stenosis of the basilar artery.  Luminal irregularity of all intracranial vessels consistent with atherosclerosis.   Electronically Signed   By: Elon Alas   On: 10/19/2014 23:30   Mr Brain Wo Contrast  10/19/2014   CLINICAL DATA:  Slurred speech after awakening yesterday morning. No focal neurological symptoms. History of hypertension, stroke, hyperlipidemia, dizziness.  EXAM: MRI HEAD WITHOUT CONTRAST  MRA HEAD WITHOUT CONTRAST  TECHNIQUE: Multiplanar, multiecho pulse sequences of the brain and surrounding structures were obtained without intravenous contrast. Angiographic images of the head were obtained using MRA technique without contrast.  COMPARISON:  CT of the head October 19, 2014 at 1323 hours  FINDINGS: MRI HEAD FINDINGS  13 x 33 mm patchy reduced diffusion in RIGHT basal ganglia, corresponding low ADC values. Faint susceptibility artifact and LEFT basal ganglia corresponding to prior infarct. Bilateral basal ganglia mineralization. Multiple punctate foci of susceptibility artifact  within LEFT occipital lobe.  Ventricles and sulci are normal for patient's age. Confluent supratentorial white matter T2 hyperintensities without mass effect or midline shift. Multiple tiny T2 hyperintensities in the Mendez. Remote bilateral basal ganglia and thalamus lacunar infarcts.  No abnormal extra-axial fluid collections. Ocular globes and orbital contents are unremarkable. Mild paranasal sinus mucosal thickening without air-fluid levels. Mastoid air cells appear well-aerated. No abnormal sellar expansion. No cerebellar tonsillar ectopia. No suspicious calvarial bone marrow signal. Patient appears edentulous.  MRA HEAD FINDINGS  Anterior circulation: Normal flow related enhancement of the included cervical, petrous internal carotid arteries. Mid to high-grade stenosis of the proximal RIGHT cavernous internal carotid artery, luminal irregularity of the distal cavernous and supraclinoid internal carotid artery. Mild luminal irregularity of the cavernous LEFT internal carotid artery with at least moderate may grade stenosis the anterior genu. Mid to high-grade stenosis of the origin of the RIGHT M1 segment, moderate luminal irregularity LEFT M1 segment.  Congenitally dominant LEFT A1 segment, patent anterior communicating artery. Tiny RIGHT A1 segment, with distal tapering. 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 High-grade stenosis of the RIGHT proximal A2 segment, with poststenotic dilatation and luminal irregularity distally. At least midgrade stenosis of proximal LEFT A2 segment.  No large vessel occlusion.  Posterior circulation:  RIGHT vertebral artery is dominant. Absent LEFT vertebral artery flow void, with retrograde flow into distal LEFT V4 segment. Patent, moderate narrowing of the proximal basilar artery. Mild luminal irregularity of the bilateral P1 segments, high-grade stenosis versus occlusion of the proximal RIGHT greater than LEFT P2 segments with suspected collateralization. Luminal irregularity of the  mid to distal posterior cerebral artery segments.  IMPRESSION: MRI HEAD: Acute RIGHT basal ganglia infarct.  Remote bilateral basal ganglia and thalamus lacunar infarcts. Severe white matter changes suggest chronic small vessel ischemic disease.  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  Mid high-grade stenosis of RIGHT M1 segment, moderate LEFT M1 segment stenosis.  High-grade stenosis of RIGHT proximal A2 segment, mid grade stenosis of proximal LEFT A2 segment.  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  High-grade stenosis versus focal occlusion of the bilateral mid to distal P2 segments with suspected collateralization. Moderate stenosis of the basilar artery.  Luminal irregularity of all intracranial vessels consistent with atherosclerosis.   Electronically Signed   By: Elon Alas   On: 10/19/2014 23:30     Results/Tests Pending at Time of Discharge: none  Discharge Medications:    Medication List    TAKE these medications        aspirin 81 MG chewable tablet  Chew 1 tablet (81 mg total) by mouth daily.     atorvastatin 80 MG tablet  Commonly known as:  LIPITOR  Take 1 tablet (80 mg total) by mouth daily at 6 PM.     clopidogrel 75 MG tablet  Commonly known as:  PLAVIX  Take 1 tablet (75 mg total) by mouth daily.     finasteride 5 MG tablet  Commonly known as:  PROSCAR     lisinopril 10 MG tablet  Commonly known as:  PRINIVIL,ZESTRIL  Take 1 tablet (10 mg total) by mouth daily.     metoprolol tartrate 25 MG tablet  Commonly known as:  LOPRESSOR  Take 1 tablet (25 mg total) by mouth 2 (two) times daily.     nitroGLYCERIN 0.4 MG SL tablet  Commonly known as:  NITROSTAT  Place 1 tablet (0.4 mg total) under the tongue every 5 (five) minutes x 3 doses as needed for chest pain.     oxybutynin 5 MG tablet  Commonly known as:  DITROPAN         Discharge Instructions: Please refer to Patient Instructions section of EMR for full details.  Patient was counseled important signs and symptoms that should prompt return to medical care, changes in medications, dietary instructions, activity restrictions, and follow up appointments.   Follow-Up Appointments: Follow-up Information    Follow up with SETHI,PRAMOD, MD In 2 months.   Specialties:  Neurology, Radiology   Why:  If symptoms worsen   Contact information:   [place] [address] [phone]       Follow up with Janora Norlander, DO. Go on 11/03/2014.   Specialty:  Family Medicine   Why:  3:30pm (hospital follow up)   Contact information:   [address] [address] [place], DO 10/22/2014, 9:40 AM PGY-1, [place]

## 2014-10-20 NOTE — Progress Notes (Signed)
Family Medicine Teaching Service Daily Progress Note Intern Pager: [phone]  Patient name: Norman Mendez Medical record number: [id number] Date of birth: June 05, 1946 Age: 69 y.o. 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  Assessment and Plan: Dugan Vanhoesen is a 69 y.o. male presenting with slurred speech . PMH is significant for NSTEMI, HLD, HTN  Dysarthria: CT head negative for acute processes. Chronic ischemic changes are noted.  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  Luminal irregularity of all intracranial vessels consistent with atherosclerosis. - on telemetry - VS per floor protocol - HgbA1c pending - PT consult, OT consult, Speech consult - Echocardiogram and Carotid dopplers ordered - Prophylactic therapy-Antiplatelet med: Aspirin - dose 325mg  daily - NPO until RN stroke swallow screen - Frequent neuro checks - c/s neurology, appreciate recs - Statin, BP control, ASA - risk stratification: Lipids, TSH, AIC  HTN: BP 169/98 in ED. On Lisinopril 5mg  & Metoprolol 25mg  BID at home. There has been issues with compliance with medications previously.  BP 147/99. - Will want to allow for permissive HTN with concerns for a stroke - Will place PRN hydralazine orders with parameters.   H/o NSTEMI: 02/2014. Cath revealing 70-90% proximal to mid stenosis in a nondominant right coronary. The first diagonal of the LAD is small and totally occluded in the midsegment. Anterolateral wall motion abnormality with LVEF 45-50%. EKG in ED stable from previous. -Continue ASA, Statin, -will continue HTN meds once MRI r/o stroke.   ?BPH: although not on his history pt home medications state ditropan and finasteride use.  - Will restart tomorrow if MRI  negative. Do not want potential BP lowering effects (althogh minimal). Also uncertain of compliance with these medications in outpatient setting. - will monitor urine output closely.   Tobacco use: smoking 1 cig/day  -Continued smoking cessation counseling   Disposition: Discharge pending evaluation for stroke  Subjective:  Patient very tearful on exam.  He does not want information relayed to daughter at present time.  He reports being frustrated and scared.  He denies headache, CP, SOB, dizziness, difficulty swallowing.  He also states that he doesn't even notice that his speech is slurred but that his daughter made him come to hospital for concerns.  Objective: Temp:  [97.6 F (36.4 C)-98.1 F (36.7 C)] 97.7 F (36.5 C) (02/23 0400) Pulse Rate:  [62-95] 73 (02/23 0400) Resp:  [13-23] 16 (02/23 0400) BP: (147-197)/(76-110) 147/99 mmHg (02/23 0400) SpO2:  [96 %-100 %] 99 % (02/23 0400) Weight:  [173 lb (78.472 kg)] 173 lb (78.472 kg) (02/22 1251) Physical Exam: General: awake, alert, tearful, speech at bedside Cardiovascular: RRR, no m/r/g Respiratory: CTAB, no increased WOB Abdomen: soft, NT/ND Extremities: WWP, 5/5 strength b/l UE and LE but L side seemingly weaker than R Neuro: patient with appreciable L sided facial droop, speaks from R side of mouth, slurred speech appreciated, otherwise CN seem to be intact, sensation grossly in tact  Laboratory:  Recent Labs Lab 10/19/14 1120 10/19/14 1430 10/20/14 0438  WBC 5.7  --  5.7  HGB 14.1 16.3 13.8  HCT 41.6 48.0 41.5  PLT 288  --  231    Recent Labs Lab 10/19/14 1430 10/19/14 1545  NA 139 140  K 6.5* 3.7  CL 103 104  CO2  --  27  BUN 10 7  CREATININE 0.90 0.87  CALCIUM  --  9.1  PROT  --  7.3  BILITOT  --  0.9  ALKPHOS  --  93  ALT  --  21  AST  --  20  GLUCOSE 93 96    Lipid Panel     Component Value Date/Time   CHOL 255* 10/19/2014 1900   TRIG 71 10/19/2014 1900   HDL 41 10/19/2014 1900   CHOLHDL  6.2 10/19/2014 1900   VLDL 14 10/19/2014 1900   LDLCALC 200* 10/19/2014 1900   TSH 1.867 A1c: pending  Imaging/Diagnostic Tests: Ct Head Wo Contrast  10/19/2014   CLINICAL DATA:  Slurred speech for 1 day  EXAM: CT HEAD WITHOUT CONTRAST  TECHNIQUE: Contiguous axial images were obtained from the base of the skull through the vertex without intravenous contrast.  COMPARISON:  11/16/2011  FINDINGS: Chronic ischemic changes in the periventricular white matter are noted. New chronic appearing infarcts have developed in the left caudate head extending into the anterior internal capsule. New infarct in the left corona radiata. New are infarct in the right internal capsule extending into the right corona radiata. There is no mass effect, midline shift, or acute hemorrhage. Mastoid air cells are clear. Cranium is intact.  IMPRESSION: Chronic ischemic changes are noted. There are new bilateral infarcts which have a chronic appearance. This is in comparison to the prior study from 2013.   Electronically Signed   By: Marybelle Killings M.D.   On: 10/19/2014 13:58   [name] Head Wo Contrast  10/19/2014   CLINICAL DATA:  Slurred speech after awakening yesterday morning. No focal neurological symptoms. History of hypertension, stroke, hyperlipidemia, dizziness.  EXAM: MRI HEAD WITHOUT CONTRAST  MRA HEAD WITHOUT CONTRAST  TECHNIQUE: Multiplanar, multiecho pulse sequences of the brain and surrounding structures were obtained without intravenous contrast. Angiographic images of the head were obtained using MRA technique without contrast.  COMPARISON:  CT of the head October 19, 2014 at 1323 hours  FINDINGS: MRI HEAD FINDINGS  13 x 33 mm patchy reduced diffusion in RIGHT basal ganglia, corresponding low ADC values. Faint susceptibility artifact and LEFT basal ganglia corresponding to prior infarct. Bilateral basal ganglia mineralization. Multiple punctate foci of susceptibility artifact within LEFT occipital lobe.  Ventricles and  sulci are normal for patient's age. Confluent supratentorial white matter T2 hyperintensities without mass effect or midline shift. Multiple tiny T2 hyperintensities in the pons. Remote bilateral basal ganglia and thalamus lacunar infarcts.  No abnormal extra-axial fluid collections. Ocular globes and orbital contents are unremarkable. Mild paranasal sinus mucosal thickening without air-fluid levels. Mastoid air cells appear well-aerated. No abnormal sellar expansion. No cerebellar tonsillar ectopia. No suspicious calvarial bone marrow signal. Patient appears edentulous.  MRA HEAD FINDINGS  Anterior circulation: Normal flow related enhancement of the included cervical, petrous internal carotid arteries. Mid to high-grade stenosis of the proximal RIGHT cavernous internal carotid artery, luminal irregularity of the distal cavernous and supraclinoid internal carotid artery. Mild luminal irregularity of the cavernous LEFT internal carotid artery with at least moderate may grade stenosis the anterior genu. Mid to high-grade stenosis of the origin of the RIGHT M1 segment, moderate luminal irregularity LEFT M1 segment.  Congenitally dominant LEFT A1 segment, patent anterior communicating artery. Tiny RIGHT A1 segment, with distal tapering. 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 High-grade stenosis of the RIGHT proximal A2 segment, with poststenotic dilatation and luminal irregularity distally. At least midgrade stenosis of  proximal LEFT A2 segment.  No large vessel occlusion.  Posterior circulation: RIGHT vertebral artery is dominant. Absent LEFT vertebral artery flow void, with retrograde flow into distal LEFT V4 segment. Patent, moderate narrowing of the proximal basilar artery. Mild luminal irregularity of the bilateral P1 segments, high-grade stenosis versus occlusion of the proximal RIGHT greater than LEFT P2 segments with suspected collateralization. Luminal irregularity of the mid to distal posterior cerebral artery  segments.  IMPRESSION: MRI HEAD: Acute RIGHT basal ganglia infarct.  Remote bilateral basal ganglia and thalamus lacunar infarcts. Severe white matter changes suggest chronic small vessel ischemic disease.  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  Mid high-grade stenosis of RIGHT M1 segment, moderate LEFT M1 segment stenosis.  High-grade stenosis of RIGHT proximal A2 segment, mid grade stenosis of proximal LEFT A2 segment.  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  High-grade stenosis versus focal occlusion of the bilateral mid to distal P2 segments with suspected collateralization. Moderate stenosis of the basilar artery.  Luminal irregularity of all intracranial vessels consistent with atherosclerosis.   Electronically Signed   By: Elon Alas   On: 10/19/2014 23:30   Mr Brain Wo Contrast  10/19/2014   CLINICAL DATA:  Slurred speech after awakening yesterday morning. No focal neurological symptoms. History of hypertension, stroke, hyperlipidemia, dizziness.  EXAM: MRI HEAD WITHOUT CONTRAST  MRA HEAD WITHOUT CONTRAST  TECHNIQUE: Multiplanar, multiecho pulse sequences of the brain and surrounding structures were obtained without intravenous contrast. Angiographic images of the head were obtained using MRA technique without contrast.  COMPARISON:  CT of the head October 19, 2014 at 1323 hours  FINDINGS: MRI HEAD FINDINGS  13 x 33 mm patchy reduced diffusion in RIGHT basal ganglia, corresponding low ADC values. Faint susceptibility artifact and LEFT basal ganglia corresponding to prior infarct. Bilateral basal ganglia mineralization. Multiple punctate foci of susceptibility artifact within LEFT occipital lobe.  Ventricles and sulci are normal for patient's age. Confluent supratentorial white matter T2 hyperintensities without mass effect or midline shift. Multiple tiny T2  hyperintensities in the pons. Remote bilateral basal ganglia and thalamus lacunar infarcts.  No abnormal extra-axial fluid collections. Ocular globes and orbital contents are unremarkable. Mild paranasal sinus mucosal thickening without air-fluid levels. Mastoid air cells appear well-aerated. No abnormal sellar expansion. No cerebellar tonsillar ectopia. No suspicious calvarial bone marrow signal. Patient appears edentulous.  MRA HEAD FINDINGS  Anterior circulation: Normal flow related enhancement of the included cervical, petrous internal carotid arteries. Mid to high-grade stenosis of the proximal RIGHT cavernous internal carotid artery, luminal irregularity of the distal cavernous and supraclinoid internal carotid artery. Mild luminal irregularity of the cavernous LEFT internal carotid artery with at least moderate may grade stenosis the anterior genu. Mid to high-grade stenosis of the origin of the RIGHT M1 segment, moderate luminal irregularity LEFT M1 segment.  Congenitally dominant LEFT A1 segment, patent anterior communicating artery. Tiny RIGHT A1 segment, with distal tapering. 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 High-grade stenosis of the RIGHT proximal A2 segment, with poststenotic dilatation and luminal irregularity distally. At least midgrade stenosis of proximal LEFT A2 segment.  No large vessel occlusion.  Posterior circulation: RIGHT vertebral artery is dominant. Absent LEFT vertebral artery flow void, with retrograde flow into distal LEFT V4 segment. Patent, moderate narrowing of the proximal basilar artery. Mild luminal irregularity of the bilateral P1 segments, high-grade stenosis versus occlusion of the  proximal RIGHT greater than LEFT P2 segments with suspected collateralization. Luminal irregularity of the mid to distal posterior cerebral artery segments.  IMPRESSION: MRI HEAD: Acute RIGHT basal ganglia infarct.  Remote bilateral basal ganglia and thalamus lacunar infarcts. Severe white matter  changes suggest chronic small vessel ischemic disease.  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  Mid high-grade stenosis of RIGHT M1 segment, moderate LEFT M1 segment stenosis.  High-grade stenosis of RIGHT proximal A2 segment, mid grade stenosis of proximal LEFT A2 segment.  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  High-grade stenosis versus focal occlusion of the bilateral mid to distal P2 segments with suspected collateralization. Moderate stenosis of the basilar artery.  Luminal irregularity of all intracranial vessels consistent with atherosclerosis.   Electronically Signed   By: Elon Alas   On: 10/19/2014 23:30    Janora Norlander, DO 10/20/2014, 6:42 AM PGY-1, [place] Intern pager: [phone], text pages welcome

## 2014-10-20 NOTE — Evaluation (Signed)
Physical Therapy Evaluation Patient Details Name: Norman Mendez MRN: [id number] DOB: [birth year] Today's Date: 10/20/2014   History of Present Illness  Patient is a 69 y/o male presenting with slurred speech. PMH of NSTEMI, HLD, HTN. Head CT (-). MRI head- + acute R basal ganglia infarct. MRA head- high grade stenosis of multiple vessels, 38mm R A1 2 junction aneurysm. Luminal irregularity of all intracranial vessels consistent with atherosclerosis.    Clinical Impression  Patient presents with functional limitations due to deficits listed in PT problem list (see below). Pt with generalized weakness, poor sitting balance and impaired safety awareness impacting mobility. Left lateral trunk lean during gait requiring Min A for support. Would benefit from AD next session. Pt not safe to be home alone at this time. Reports family could assist at d/c however need to see how much. Would benefit from skilled PT to improve transfers, gait, balance and mobility so pt can maximize independence and return to PLOF.    Follow Up Recommendations Home health PT;Supervision/Assistance - 24 hour    Equipment Recommendations  Other (comment) (TBD)    Recommendations for Other Services OT consult     Precautions / Restrictions Precautions Precautions: Fall Restrictions Weight Bearing Restrictions: No      Mobility  Bed Mobility Overal bed mobility: Needs Assistance Bed Mobility: Supine to Sit     Supine to sit: Min assist;HOB elevated     General bed mobility comments: Min A to get to EOB with use of rails for support. Poor sitting balance with posterior lean and Min A to get to upright on numerous occasions.   Transfers Overall transfer level: Needs assistance Equipment used: None Transfers: Sit to/from Stand Sit to Stand: Min assist         General transfer comment: Min A to rise from EOB. UNsteady with left lateral lean.  Ambulation/Gait Ambulation/Gait assistance: Min  assist Ambulation Distance (Feet): 30 Feet Assistive device: None Gait Pattern/deviations: Step-through pattern;Staggering left;Drifts right/left;Narrow base of support   Gait velocity interpretation: Below normal speed for age/gender General Gait Details: Pt with slow, unsteady gait with left lateral trunk lean. Shuffling steps initially. Reports feeling "fine."  Stairs            Wheelchair Mobility    Modified Rankin (Stroke Patients Only) Modified Rankin (Stroke Patients Only) Pre-Morbid Rankin Score: Slight disability Modified Rankin: Moderately severe disability     Balance Overall balance assessment: Needs assistance Sitting-balance support: Feet supported;No upper extremity supported Sitting balance-Leahy Scale: Poor Sitting balance - Comments: Requires Min A to maintain upright sitting. Constant slow posterior trunk lean upon sitting EOB with manual cues and assist to get to sitting. Postural control: Posterior lean Standing balance support: During functional activity Standing balance-Leahy Scale: Fair                               Pertinent Vitals/Pain Pain Assessment: No/denies pain    Home Living Family/patient expects to be discharged to:: Private residence Living Arrangements: Alone Available Help at Discharge: Family;Available PRN/intermittently Type of Home: Apartment Home Access: Elevator     Home Layout: One level Home Equipment: None      Prior Function Level of Independence: Independent         Comments: Pt reports cooking and taking the bus for transportation. CHildren assist with housework.     Hand Dominance        Extremity/Trunk Assessment   Upper Extremity Assessment:  Defer to OT evaluation           Lower Extremity Assessment: Generalized weakness         Communication   Communication: Other (comment) (Slurred speech?)  Cognition Arousal/Alertness: Awake/alert Behavior During Therapy: WFL for tasks  assessed/performed Overall Cognitive Status: Impaired/Different from baseline Area of Impairment: Awareness               General Comments: Pt's gown saturated in urine upon PT arrival- not sure of awareness?    General Comments      Exercises        Assessment/Plan    PT Assessment Patient needs continued PT services  PT Diagnosis Generalized weakness;Difficulty walking   PT Problem List Decreased strength;Decreased cognition;Decreased balance;Decreased mobility;Decreased safety awareness;Decreased activity tolerance  PT Treatment Interventions Balance training;Gait training;Patient/family education;Functional mobility training;Therapeutic activities;Therapeutic exercise;DME instruction;Neuromuscular re-education   PT Goals (Current goals can be found in the Care Plan section) Acute Rehab PT Goals Patient Stated Goal: to get out of this bed PT Goal Formulation: With patient Time For Goal Achievement: 11/03/14 Potential to Achieve Goals: Fair    Frequency Min 4X/week   Barriers to discharge Decreased caregiver support Pt lives alone.    Co-evaluation               End of Session Equipment Utilized During Treatment: Gait belt Activity Tolerance: Patient limited by fatigue Patient left: in bed;with call bell/phone within reach;with bed alarm set (sitting EOB.) Nurse Communication: Mobility status         Time: 9449-6759 PT Time Calculation (min) (ACUTE ONLY): 15 min   Charges:   PT Evaluation $Initial PT Evaluation Tier I: 1 Procedure     PT G CodesCandy Sledge A 28-Oct-2014, 3:36 PM Candy Sledge, PT, DPT [phone]

## 2014-10-20 NOTE — Progress Notes (Addendum)
PT Cancellation Note  Patient Details Name: Norman Mendez MRN: [id number] DOB: 08/20/1946   Cancelled Treatment:    Reason Eval/Treat Not Completed: Medical issues which prohibited therapy Holding PT evaluation as pt on bedrest. Will await increase in activity orders prior to PT evaluation.  On second attempt to perform PT evaluation, pt off floor at test. Will follow up next available time.  Candy Sledge A 10/20/2014, 8:56 AM Candy Sledge, PT, DPT [phone]

## 2014-10-20 NOTE — Progress Notes (Signed)
CARE MANAGEMENT NOTE 10/20/2014  Patient:  Norman Mendez, Norman Mendez   Account Number:  [id number]  Date Initiated:  10/20/2014  Documentation initiated by:  Olga Coaster  Subjective/Objective Assessment:   ADMITTED WITH STROKE     Action/Plan:   CM FOLLOWING FOR DCP   Anticipated DC Date:  10/24/2014   Anticipated DC Plan:  AWAITING FOR PT/OT EVALS FOR DISPOSITION NEEDS    DC Planning Services  CM consult         Status of service:  In process, will continue to follow Medicare Important Message given?   (If response is "NO", the following Medicare IM given date fields will be blank)  Per UR Regulation:  Reviewed for med. necessity/level of care/duration of stay  Comments:  2/23/2016Mindi Slicker RN,BSN,MHA [phone]

## 2014-10-20 NOTE — Progress Notes (Signed)
STROKE TEAM PROGRESS NOTE   HISTORY Norman Mendez is an 69 y.o. male with known hyperlipidemia, non STEMI, CVA (no known date and states no residual symptoms) and HTN. Patient felt normal on the night of 10/17/2014 at the time he went to sleep. He awoke the next morning and noted his speech was slurred. He denies any other symptoms. He did not notify anyone as he thought it may get better. Today he was talking with his daughter on the phone and she also noted the speech was slurred. EMS was called to his apartment and he was brought to ED. Initial CT head shows no acute infarct but does note new bilateral infarcts which appear chronic when compared to previous CT scans. Currently he has no complaints other than slurred speech. He states he takes ASA daily and does not miss a dose.   Date last known well: Date: 10/17/2014 Time last known well: Time: 23:00 tPA Given: No: out of window  He was admitted to the neuro 4 N for further evaluation and treatment.   SUBJECTIVE (INTERVAL HISTORY) Overall he feels his condition is improved.  OBJECTIVE Temp:  [97.6 F (36.4 C)-98.1 F (36.7 C)] 97.7 F (36.5 C) (02/23 0400) Pulse Rate:  [62-95] 73 (02/23 0400) Cardiac Rhythm:  [-] Normal sinus rhythm (02/22 2000) Resp:  [13-23] 16 (02/23 0400) BP: (147-197)/(76-110) 147/99 mmHg (02/23 0400) SpO2:  [96 %-100 %] 99 % (02/23 0400) Weight:  [78.472 kg (173 lb)] 78.472 kg (173 lb) (02/22 1251)  No results for input(s): GLUCAP in the last 168 hours.  Recent Labs Lab 10/19/14 1430 10/19/14 1545 10/20/14 0438  NA 139 140 138  K 6.5* 3.7 3.9  CL 103 104 105  CO2  --  27 30  GLUCOSE 93 96 108*  BUN 10 7 8   CREATININE 0.90 0.87 0.92  CALCIUM  --  9.1 8.9    Recent Labs Lab 10/19/14 1545 10/20/14 0438  AST 20 15  ALT 21 15  ALKPHOS 93 85  BILITOT 0.9 1.2  PROT 7.3 6.9  ALBUMIN 3.5 3.3*    Recent Labs Lab 10/19/14 1120 10/19/14 1430 10/20/14 0438  WBC 5.7  --  5.7   NEUTROABS 3.0  --  2.8  HGB 14.1 16.3 13.8  HCT 41.6 48.0 41.5  MCV 86.1  --  88.7  PLT 288  --  231   No results for input(s): CKTOTAL, CKMB, CKMBINDEX, TROPONINI in the last 168 hours.  Recent Labs  10/19/14 1120  LABPROT 13.4  INR 1.01    Recent Labs  10/19/14 1512  COLORURINE YELLOW  LABSPEC 1.017  PHURINE 6.0  GLUCOSEU NEGATIVE  HGBUR NEGATIVE  BILIRUBINUR NEGATIVE  KETONESUR NEGATIVE  PROTEINUR NEGATIVE  UROBILINOGEN 0.2  NITRITE NEGATIVE  LEUKOCYTESUR NEGATIVE       Component Value Date/Time   CHOL 255* 10/19/2014 1900   TRIG 71 10/19/2014 1900   HDL 41 10/19/2014 1900   CHOLHDL 6.2 10/19/2014 1900   VLDL 14 10/19/2014 1900   LDLCALC 200* 10/19/2014 1900   Lab Results  Component Value Date   HGBA1C 6.5* 03/17/2014      Component Value Date/Time   LABOPIA NONE DETECTED 10/19/2014 1511   COCAINSCRNUR NONE DETECTED 10/19/2014 1511   LABBENZ NONE DETECTED 10/19/2014 1511   AMPHETMU NONE DETECTED 10/19/2014 1511   THCU NONE DETECTED 10/19/2014 1511   LABBARB NONE DETECTED 10/19/2014 1511     Recent Labs Lab 10/19/14 1505  ETH <5    Ct  Head Wo Contrast  10/19/2014   CLINICAL DATA:  Slurred speech for 1 day  EXAM: CT HEAD WITHOUT CONTRAST  TECHNIQUE: Contiguous axial images were obtained from the base of the skull through the vertex without intravenous contrast.  COMPARISON:  11/16/2011  FINDINGS: Chronic ischemic changes in the periventricular white matter are noted. New chronic appearing infarcts have developed in the left caudate head extending into the anterior internal capsule. New infarct in the left corona radiata. New are infarct in the right internal capsule extending into the right corona radiata. There is no mass effect, midline shift, or acute hemorrhage. Mastoid air cells are clear. Cranium is intact.  IMPRESSION: Chronic ischemic changes are noted. There are new bilateral infarcts which have a chronic appearance. This is in comparison to the  prior study from 2013.   Electronically Signed   By: Marybelle Killings M.D.   On: 10/19/2014 13:58   [name] Head Wo Contrast  10/19/2014   CLINICAL DATA:  Slurred speech after awakening yesterday morning. No focal neurological symptoms. History of hypertension, stroke, hyperlipidemia, dizziness.  EXAM: MRI HEAD WITHOUT CONTRAST  MRA HEAD WITHOUT CONTRAST  TECHNIQUE: Multiplanar, multiecho pulse sequences of the brain and surrounding structures were obtained without intravenous contrast. Angiographic images of the head were obtained using MRA technique without contrast.  COMPARISON:  CT of the head October 19, 2014 at 1323 hours  FINDINGS: MRI HEAD FINDINGS  13 x 33 mm patchy reduced diffusion in RIGHT basal ganglia, corresponding low ADC values. Faint susceptibility artifact and LEFT basal ganglia corresponding to prior infarct. Bilateral basal ganglia mineralization. Multiple punctate foci of susceptibility artifact within LEFT occipital lobe.  Ventricles and sulci are normal for patient's age. Confluent supratentorial white matter T2 hyperintensities without mass effect or midline shift. Multiple tiny T2 hyperintensities in the pons. Remote bilateral basal ganglia and thalamus lacunar infarcts.  No abnormal extra-axial fluid collections. Ocular globes and orbital contents are unremarkable. Mild paranasal sinus mucosal thickening without air-fluid levels. Mastoid air cells appear well-aerated. No abnormal sellar expansion. No cerebellar tonsillar ectopia. No suspicious calvarial bone marrow signal. Patient appears edentulous.  MRA HEAD FINDINGS  Anterior circulation: Normal flow related enhancement of the included cervical, petrous internal carotid arteries. Mid to high-grade stenosis of the proximal RIGHT cavernous internal carotid artery, luminal irregularity of the distal cavernous and supraclinoid internal carotid artery. Mild luminal irregularity of the cavernous LEFT internal carotid artery with at least  moderate may grade stenosis the anterior genu. Mid to high-grade stenosis of the origin of the RIGHT M1 segment, moderate luminal irregularity LEFT M1 segment.  Congenitally dominant LEFT A1 segment, patent anterior communicating artery. Tiny RIGHT A1 segment, with distal tapering. 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 High-grade stenosis of the RIGHT proximal A2 segment, with poststenotic dilatation and luminal irregularity distally. At least midgrade stenosis of proximal LEFT A2 segment.  No large vessel occlusion.  Posterior circulation: RIGHT vertebral artery is dominant. Absent LEFT vertebral artery flow void, with retrograde flow into distal LEFT V4 segment. Patent, moderate narrowing of the proximal basilar artery. Mild luminal irregularity of the bilateral P1 segments, high-grade stenosis versus occlusion of the proximal RIGHT greater than LEFT P2 segments with suspected collateralization. Luminal irregularity of the mid to distal posterior cerebral artery segments.  IMPRESSION: MRI HEAD: Acute RIGHT basal ganglia infarct.  Remote bilateral basal ganglia and thalamus lacunar infarcts. Severe white matter changes suggest chronic small vessel ischemic disease.  MRA HEAD: Mid to high-grade stenosis of proximal  RIGHT cavernous internal carotid artery with at least moderate LEFT internal carotid artery stenosis at the anterior genu.  Mid high-grade stenosis of RIGHT M1 segment, moderate LEFT M1 segment stenosis.  High-grade stenosis of RIGHT proximal A2 segment, mid grade stenosis of proximal LEFT A2 segment.  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  High-grade stenosis versus focal occlusion of the bilateral mid to distal P2 segments with suspected collateralization. Moderate stenosis of the basilar artery.  Luminal irregularity of all intracranial vessels consistent with atherosclerosis.   Electronically Signed   By: Elon Alas   On: 10/19/2014 23:30   Mr Brain Wo Contrast  10/19/2014   CLINICAL DATA:   Slurred speech after awakening yesterday morning. No focal neurological symptoms. History of hypertension, stroke, hyperlipidemia, dizziness.  EXAM: MRI HEAD WITHOUT CONTRAST  MRA HEAD WITHOUT CONTRAST  TECHNIQUE: Multiplanar, multiecho pulse sequences of the brain and surrounding structures were obtained without intravenous contrast. Angiographic images of the head were obtained using MRA technique without contrast.  COMPARISON:  CT of the head October 19, 2014 at 1323 hours  FINDINGS: MRI HEAD FINDINGS  13 x 33 mm patchy reduced diffusion in RIGHT basal ganglia, corresponding low ADC values. Faint susceptibility artifact and LEFT basal ganglia corresponding to prior infarct. Bilateral basal ganglia mineralization. Multiple punctate foci of susceptibility artifact within LEFT occipital lobe.  Ventricles and sulci are normal for patient's age. Confluent supratentorial white matter T2 hyperintensities without mass effect or midline shift. Multiple tiny T2 hyperintensities in the pons. Remote bilateral basal ganglia and thalamus lacunar infarcts.  No abnormal extra-axial fluid collections. Ocular globes and orbital contents are unremarkable. Mild paranasal sinus mucosal thickening without air-fluid levels. Mastoid air cells appear well-aerated. No abnormal sellar expansion. No cerebellar tonsillar ectopia. No suspicious calvarial bone marrow signal. Patient appears edentulous.  MRA HEAD FINDINGS  Anterior circulation: Normal flow related enhancement of the included cervical, petrous internal carotid arteries. Mid to high-grade stenosis of the proximal RIGHT cavernous internal carotid artery, luminal irregularity of the distal cavernous and supraclinoid internal carotid artery. Mild luminal irregularity of the cavernous LEFT internal carotid artery with at least moderate may grade stenosis the anterior genu. Mid to high-grade stenosis of the origin of the RIGHT M1 segment, moderate luminal irregularity LEFT M1  segment.  Congenitally dominant LEFT A1 segment, patent anterior communicating artery. Tiny RIGHT A1 segment, with distal tapering. 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 High-grade stenosis of the RIGHT proximal A2 segment, with poststenotic dilatation and luminal irregularity distally. At least midgrade stenosis of proximal LEFT A2 segment.  No large vessel occlusion.  Posterior circulation: RIGHT vertebral artery is dominant. Absent LEFT vertebral artery flow void, with retrograde flow into distal LEFT V4 segment. Patent, moderate narrowing of the proximal basilar artery. Mild luminal irregularity of the bilateral P1 segments, high-grade stenosis versus occlusion of the proximal RIGHT greater than LEFT P2 segments with suspected collateralization. Luminal irregularity of the mid to distal posterior cerebral artery segments.  IMPRESSION: MRI HEAD: Acute RIGHT basal ganglia infarct.  Remote bilateral basal ganglia and thalamus lacunar infarcts. Severe white matter changes suggest chronic small vessel ischemic disease.  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  Mid high-grade stenosis of RIGHT M1 segment, moderate LEFT M1 segment stenosis.  High-grade stenosis of RIGHT proximal A2 segment, mid grade stenosis of proximal LEFT A2 segment.  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  High-grade stenosis versus focal occlusion of the bilateral mid to distal P2 segments with suspected collateralization. Moderate stenosis of the basilar artery.  Luminal irregularity of all intracranial vessels consistent with atherosclerosis.   Electronically Signed   By: Elon Alas   On: 10/19/2014 23:30   PHYSICAL EXAM HEENT- Normocephalic, no lesions, without obvious abnormality. Normal external eye and conjunctiva. Normal TM's bilaterally. Normal auditory canals and external ears. Normal external nose,  mucus membranes and septum. Normal pharynx. Cardiovascular- S1, S2 normal, pulses palpable throughout  Lungs- chest clear, no wheezing, rales, normal symmetric air entry Abdomen- normal findings: bowel sounds normal Extremities- no edema Lymph-no adenopathy palpable Musculoskeletal-no joint tenderness, deformity or swelling Skin-warm and dry, no hyperpigmentation, vitiligo, or suspicious lesions  Neurological Examination Mental Status: Alert, oriented, thought content appropriate. Speech mildly dysarthric without evidence of aphasia. Able to follow 3 step commands without difficulty. Cranial Nerves: II: Discs flat bilaterally; Visual fields grossly normal, pupils equal, round, reactive to light and accommodation III,IV, VI: ptosis not present, extra-ocular motions intact bilaterally V,VII: smile asymmetric with left facial droop[, facial light touch sensation normal bilaterally VIII: hearing normal bilaterally IX,X: gag reflex present XI: bilateral shoulder shrug XII: midline tongue extension Motor: Right :Upper extremity 5/5Left: Upper extremity 4/5 Lower extremity 5/5Lower extremity 5/5 --non drift noted in UE or LE Tone and bulk:normal tone throughout; no atrophy noted Sensory: Light touch intact throughout, bilaterally Cerebellar: normal finger-to-nose, and normal heel-to-shin test Gait: not tested.  ASSESSMENT/PLAN Norman Mendez is a 69 y.o. male with history ofhyperlipidemia, non STEMI, CVA and HTN. presenting with slurred speech He did not receive IV t-PA due to outside of window.   Stroke acute right basal ganglia infarct.  Remote bilateral basal ganglia and thalamus lacunar infarcts secondary to large vessel disease.  Resultant  Residual slurred speech  MRI  Acute RIGHT basal ganglia infarct.  Remote bilateral basal ganglia and thalamus lacunar infarcts. Severe white matter changes suggest chronic small vessel ischemic disease.   MRA   Mid  to high-grade stenosis of proximal RIGHT cavernous internal carotid artery with at least moderate LEFT internal carotid artery stenosis at the anterior genu.  Mid high-grade stenosis of RIGHT M1 segment, moderate LEFT M1 segment stenosis.  High-grade stenosis of RIGHT proximal A2 segment, mid grade stenosis of proximal LEFT A2 segment.  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  High-grade stenosis versus focal occlusion of the bilateral mid to distal P2 segments with suspected collateralization. Moderate stenosis of the basilar artery.   Carotid Doppler  pending  2D Echo pending  LDL 200  HgbA1c pending  Heparin for VTE prophylaxis  Diet Heart thin liquids  aspirin 81 mg orally every day prior to admission, now on aspirin 81 mg orally every day and clopidogrel 75 mg orally every day for 3 months.  Patient counseled to be compliant with his antithrombotic medications  Ongoing aggressive stroke risk factor management  Therapy recommendations:  pending  Disposition: pending  Hypertension  Home meds:  Lisinopril, Lopressor  Unstable  Patient counseled to be compliant with his blood pressure medications  Hyperlipidemia  Home meds:  Lipitor 80 mgs  resumed in hospital  LDL 200, goal < 70 Continue statin at discharge and consider new injectable  PCS 9 inhibitor (Repatha or Praluent) as an outpatient  Diabetes  HgbA1c pending, goal < 7.0  Other Stroke Risk Factors  Smokes one cigarette a day  Obesity, Body mass index is 27.94 kg/(m^2).   H/O  CVA   Hospital day # 1  Patients chart and images have been reviewed. Patient seen and discussed with Dr. Leonie Man.  Lanelle Bal, PA-C Department of Neurology I have personally examined this patient, reviewed notes, independently viewed imaging studies, participated in medical decision making and plan of care. I have made any additions or clarifications directly to the above note. Agree with note above. He has right BG infarct  secondary to severe intracranial atherosclerosis. He is at recurrent risk for strokes and TIAs and needs to continue ongoing stroke evaluation and maintain aggressive risk factor modification. Patient counseled to quit smoking, exercise, diet and maintain ideal body weight and keep blood pressure and cholesterol under control  Antony Contras, MD Medical Director [place] Pager: [phone] 10/20/2014 2:21 PM   To contact Stroke Continuity provider, please refer to [email or web address] After hours, contact General Neurology

## 2014-10-20 NOTE — Progress Notes (Signed)
OT Cancellation Note  Patient Details Name: Norman Mendez MRN: [id number] DOB: [birth year]   Cancelled Treatment:    Reason Eval/Treat Not Completed: Medical issues which prohibited therapy;Other (comment) (Active bed rest order)   Hortencia Pilar 10/20/2014, 10:06 AM

## 2014-10-20 NOTE — Progress Notes (Signed)
  Echocardiogram 2D Echocardiogram has been performed.  Bobbye Charleston 10/20/2014, 11:37 AM

## 2014-10-20 NOTE — Evaluation (Signed)
Speech Language Pathology Evaluation Patient Details Name: Norman Mendez MRN: [id number] DOB: 1945/09/08 Today's Date: 10/20/2014 Time: 0852-0909 SLP Time Calculation (min) (ACUTE ONLY): 17 min  Problem List:  Patient Active Problem List   Diagnosis Date Noted  . Slurred speech 10/19/2014  . Essential hypertension 09/11/2014  . Hyperlipidemia 03/18/2014  . Non-ST elevation myocardial infarction (NSTEMI) 03/17/2014  . Transaminitis 11/17/2011  . Chest pain 11/16/2011  . Sinus tachycardia 11/16/2011  . Hiccup 11/16/2011  . Dizziness - light-headed 11/16/2011  . Hyponatremia 11/16/2011  . Tobacco use 11/16/2011   Past Medical History:  Past Medical History  Diagnosis Date  . Hypertension   . CVA (cerebral infarction)   . Hyperlipemia   . NSTEMI (non-ST elevated myocardial infarction)   . Shortness of breath dyspnea    Past Surgical History:  Past Surgical History  Procedure Laterality Date  . No previous surgery    . Left heart catheterization with coronary angiogram N/A 03/18/2014    Procedure: LEFT HEART CATHETERIZATION WITH CORONARY ANGIOGRAM;  Surgeon: Sinclair Grooms, MD;  Location: [facility] LAB;  Service: Cardiovascular;  Laterality: N/A;   HPI:  69 y.o. male history of hypertension, stroke with right-sided weakness admitted with slurred speech. MRI Acute RIGHT basal ganglia infarct. Remote bilateral basal ganglia and thalamus lacunar infarcts. Severe white matter changes suggest chronic small vessel ischemic disease.   Assessment / Plan / Recommendation Clinical Impression  Pt with remove CVA "last year" per pt. No family present and unable to definitely assess current vs baseline cognition, however verbal problem solving, functional working memory re: care, orientation functional for areas evaluated today. Anticipatory awareness is questionable. Pt appropriately labile during session. Minimal dysathria, however 100% intelligible. SLP educated pt to over-articulate  sounds and speak slowly. No ST needed in acute care. If pt returns home, recommend home health ST assessmen.     SLP Assessment  All further Speech Lanaguage Pathology  needs can be addressed in the next venue of care    Follow Up Recommendations  Home health SLP    Frequency and Duration        Pertinent Vitals/Pain Pain Assessment: No/denies pain       SLP Evaluation Prior Functioning  Cognitive/Linguistic Baseline: Information not available Type of Home: Apartment  Lives With: Alone (dtr checks in daily) Available Help at Discharge: Family   Cognition  Overall Cognitive Status: Within Functional Limits for tasks assessed (awareness is questionable) Arousal/Alertness: Awake/alert Orientation Level: Oriented X4 Attention: Sustained Sustained Attention: Appears intact Memory:  (recalled 2 of 3 words (observed MD testing memory)) Awareness:  (questionable ) Problem Solving: Appears intact (for verbal) Behaviors: Lability Safety/Judgment: Appears intact    Comprehension  Auditory Comprehension Overall Auditory Comprehension: Appears within functional limits for tasks assessed Visual Recognition/Discrimination Discrimination: Not tested Reading Comprehension Reading Status: Not tested    Expression Expression Primary Mode of Expression: Verbal Verbal Expression Overall Verbal Expression: Appears within functional limits for tasks assessed Pragmatics: No impairment Written Expression Dominant Hand: Right Written Expression: Not tested   Oral / Motor Oral Motor/Sensory Function Overall Oral Motor/Sensory Function: Impaired (imparired at baseline?) Labial ROM: Reduced left Labial Symmetry: Abnormal symmetry left Labial Strength: Within Functional Limits Lingual ROM: Within Functional Limits Lingual Symmetry: Within Functional Limits Facial ROM: Within Functional Limits Facial Symmetry: Within Functional Limits Motor Speech Overall Motor Speech: Appears within  functional limits for tasks assessed Respiration: Within functional limits Phonation: Normal Resonance: Within functional limits Articulation: Within functional limitis Intelligibility: Intelligible (min  dysarthris at baseline likely) Motor Planning: Witnin functional limits   GO     Houston Siren 10/20/2014, 9:24 AM  Orbie Pyo Colvin Caroli.Ed [employer] [phone]

## 2014-10-20 NOTE — Progress Notes (Signed)
VASCULAR LAB PRELIMINARY  PRELIMINARY  PRELIMINARY  PRELIMINARY  Carotid duplex completed.    Preliminary report:  Bilateral:  1-39% ICA stenosis.  Vertebral artery flow is antegrade.     Shadia Larose, RVS 10/20/2014, 1:57 PM

## 2014-10-21 ENCOUNTER — Ambulatory Visit: Payer: Medicare Other | Admitting: Family Medicine

## 2014-10-21 DIAGNOSIS — R7309 Other abnormal glucose: Secondary | ICD-10-CM

## 2014-10-21 DIAGNOSIS — G3184 Mild cognitive impairment, so stated: Secondary | ICD-10-CM

## 2014-10-21 DIAGNOSIS — R4189 Other symptoms and signs involving cognitive functions and awareness: Secondary | ICD-10-CM

## 2014-10-21 DIAGNOSIS — IMO0002 Reserved for concepts with insufficient information to code with codable children: Secondary | ICD-10-CM | POA: Insufficient documentation

## 2014-10-21 LAB — HEMOGLOBIN A1C
Hgb A1c MFr Bld: 6.3 % — ABNORMAL HIGH (ref 4.8–5.6)
Mean Plasma Glucose: 134 mg/dL

## 2014-10-21 MED ORDER — LISINOPRIL 10 MG PO TABS
10.0000 mg | ORAL_TABLET | Freq: Every day | ORAL | Status: DC
  Administered 2014-10-21 – 2014-10-22 (×2): 10 mg via ORAL
  Filled 2014-10-21 (×2): qty 1

## 2014-10-21 NOTE — Progress Notes (Signed)
Family Medicine Teaching Service Daily Progress Note Intern Pager: [phone]  Patient name: Norman Mendez Medical record number: [id number] Date of birth: [birth year] Age: 69 y.o. 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  Assessment and Plan: Norman Mendez is a 69 y.o. male presenting with slurred speech . PMH is significant for NSTEMI, HLD, HTN  Dysarthria: CT head with new infarct from previous studies in the L corona radiata and in the right internal capsule extending to the corona radiata. Chronic ischemic changes are noted.  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  Luminal irregularity of all intracranial vessels consistent with atherosclerosis. Carotid doppler: Bilateral: 1-39% ICA stenosis. Vertebral artery flow is antegrade. Echo: Grade 1 diastolic dysfunction, estimated ejection fraction 60% to 65% - on telemetry - VS per floor protocol - HgbA1c 6.3 - PT consult ([place]), Speech consult ([place]), OT consult pending - c/s neurology, appreciate recs: Aspirin 81mg  and Plavix 75 mg x3 months, Statin, BP control - MoCa 15  HTN: BP 169/98 in ED. On Lisinopril 5mg  & Metoprolol 25mg  BID at home. There has been issues with compliance with medications previously.  BP 160/89. - Lisinopril 5mg  & Metoprolol added back on yesterday.  Will increase Lisinopril today to 10mg . Would appreciate additional BP goals from neuro. - Will place PRN hydralazine orders with parameters.   H/o NSTEMI: 02/2014. Cath revealing 70-90% proximal to mid stenosis in a nondominant right coronary. The first diagonal of the LAD is small and totally occluded in the midsegment. Anterolateral wall motion abnormality with LVEF 45-50%. EKG in ED stable from previous. -Continue ASA,  Statin  Elevated A1c/ Impaired glucose metabolism: A1c 6.3 -Consider initiation of Metformin BID vs Lifestyle modifications to reduce stroke risk in setting of impaired glucose metabolism  ?BPH: although not on his history pt home medications state ditropan and finasteride use.  - Will discuss these medications with patient, uncertain of compliance with these medications in outpatient setting. - will monitor urine output.   Tobacco use: smoking 1 cig/day  -Continued smoking cessation counseling   Disposition: Discharge pending evaluation for stroke  Subjective:  Patient states that he is feeling ok today.  He has not spoken to his daughter yet.  When asked about this, he states," because she will be on me about the stroke".  He is planning on speaking to her this morning, however.  Denies CP, SOB, dizziness, N/V.  In addition, he does report that he takes something for his prostate.  However, he is unsure of which one and reports to be urinating well while in hospital.  He is asking that is it not added back on at this time.  Objective: Temp:  [97.5 F (36.4 C)-98.8 F (37.1 C)] 98.8 F (37.1 C) (02/24 0937) Pulse Rate:  [73-91] 79 (02/24 0937) Resp:  [18] 18 (02/24 0937) BP: (137-179)/(67-99) 137/67 mmHg (02/24 0937) SpO2:  [97 %-100 %] 98 % (02/24 0937) Physical Exam: General: awake, alert, sitting up at bedside, NAD Cardiovascular: RRR, no m/r/g Respiratory: CTAB, no increased WOB Abdomen: soft, NT/ND Extremities: WWP, 5/5 strength b/l UE and LE but L side seemingly weaker than R Neuro: patient with appreciable L sided facial droop, speaks from R side of mouth, slurred speech appreciated, otherwise CN seem to be intact,  sensation grossly in tact  Laboratory:  Recent Labs Lab 10/19/14 1120 10/19/14 1430 10/20/14 0438  WBC 5.7  --  5.7  HGB 14.1 16.3 13.8  HCT 41.6 48.0 41.5  PLT 288  --  231    Recent Labs Lab 10/19/14 1430 10/19/14 1545 10/20/14 0438  NA 139  140 138  K 6.5* 3.7 3.9  CL 103 104 105  CO2  --  27 30  BUN 10 7 8   CREATININE 0.90 0.87 0.92  CALCIUM  --  9.1 8.9  PROT  --  7.3 6.9  BILITOT  --  0.9 1.2  ALKPHOS  --  93 85  ALT  --  21 15  AST  --  20 15  GLUCOSE 93 96 108*    Lipid Panel     Component Value Date/Time   CHOL 255* 10/19/2014 1900   TRIG 71 10/19/2014 1900   HDL 41 10/19/2014 1900   CHOLHDL 6.2 10/19/2014 1900   VLDL 14 10/19/2014 1900   LDLCALC 200* 10/19/2014 1900   TSH 1.867 A1c: 6.3  Imaging/Diagnostic Tests: Echo:  Study Conclusions - Left ventricle: The cavity size was normal. Wall thickness was increased in a pattern of mild LVH. Systolic function was normal. The estimated ejection fraction was in the range of 60% to 65%. Wall motion was normal; there were no regional wall motion abnormalities. Doppler parameters are consistent with abnormal left ventricular relaxation (grade 1 diastolic dysfunction).  Carotid doppler: Bilateral: 1-39% ICA stenosis. Vertebral artery flow is antegrade.   Janora Norlander, DO 10/21/2014, 9:44 AM PGY-1, [facility] Intern pager: [phone], text pages welcome

## 2014-10-21 NOTE — Progress Notes (Signed)
Physical Therapy Treatment Patient Details Name: Norman Mendez MRN: [id number] DOB: [birth year] Today's Date: 10/21/2014    History of Present Illness Patient is a 69 y/o male presenting with slurred speech. PMH of NSTEMI, HLD, HTN. Head CT (-). MRI head- + acute R basal ganglia infarct. MRA head- high grade stenosis of multiple vessels, 62mm R A1 2 junction aneurysm. Luminal irregularity of all intracranial vessels consistent with atherosclerosis.    PT Comments    Patient continues to require Min A for overall mobility. Patient lives alone and per report daughter can help but I called number left for daughter and it was a wrong number. Patient will require 24 hour assistance at home. IF not available patient will require SNF. Will follow up and update RN.   Follow Up Recommendations  Supervision/Assistance - 24 hour;Home health PT     Equipment Recommendations  Other (comment) (TBD)    Recommendations for Other Services       Precautions / Restrictions Precautions Precautions: Fall Precaution Comments: L inattention? Restrictions Weight Bearing Restrictions: No    Mobility  Bed Mobility               General bed mobility comments: Patient in recliner before and after session  Transfers Overall transfer level: Needs assistance Equipment used: None   Sit to Stand: Min assist         General transfer comment: Min A to ensure balance and power up. Cues for safe hand placement  Ambulation/Gait Ambulation/Gait assistance: Min assist Ambulation Distance (Feet): 90 Feet Assistive device: Rolling walker (2 wheeled) Gait Pattern/deviations: Step-through pattern;Decreased stride length;Shuffle   Gait velocity interpretation: Below normal speed for age/gender General Gait Details: Pt with slow, unsteady gait with left lateral trunk lean. Shuffling steps. Attempted to use RW. Initially patient lifting up RW but with cueing patient able to use more safely.     Stairs            Wheelchair Mobility    Modified Rankin (Stroke Patients Only) Modified Rankin (Stroke Patients Only) Pre-Morbid Rankin Score: Slight disability Modified Rankin: Moderately severe disability     Balance     Sitting balance-Leahy Scale: Fair       Standing balance-Leahy Scale: Poor Standing balance comment: Required UE for static balance.                     Cognition Arousal/Alertness: Awake/alert Behavior During Therapy: WFL for tasks assessed/performed Overall Cognitive Status: Impaired/Different from baseline Area of Impairment: Orientation;Attention;Memory;Safety/judgement;Awareness Orientation Level: Disoriented to;Place;Time;Situation Current Attention Level: Sustained Memory: Decreased short-term memory   Safety/Judgement: Decreased awareness of safety;Decreased awareness of deficits     General Comments: Patient unable to really follow conversation with therapist. Patient labile and crying throughout session. Unsure of home enviroment     Exercises      General Comments        Pertinent Vitals/Pain Pain Assessment: No/denies pain    Home Living                      Prior Function            PT Goals (current goals can now be found in the care plan section) Progress towards PT goals: Progressing toward goals (slowly)    Frequency  Min 4X/week    PT Plan Current plan remains appropriate    Co-evaluation             End of Session Equipment Utilized  During Treatment: Gait belt Activity Tolerance: Patient tolerated treatment well Patient left: in chair;with call bell/phone within reach;with chair alarm set     Time: 2500-3704 PT Time Calculation (min) (ACUTE ONLY): 25 min  Charges:  $Gait Training: 8-22 mins $Therapeutic Activity: 8-22 mins                    G Codes:      Jacqualyn Posey 10/21/2014, 9:23 AM 10/21/2014 Jacqualyn Posey PTA [phone] pager [phone]  office

## 2014-10-21 NOTE — Evaluation (Signed)
Occupational Therapy Evaluation Patient Details Name: Norman Mendez MRN: [id number] DOB: 1945/12/11 Today's Date: 10/21/2014    History of Present Illness Patient is a 69 y/o male presenting with slurred speech. PMH of NSTEMI, HLD, HTN. Head CT (-). MRI head- + acute R basal ganglia infarct. MRA head- high grade stenosis of multiple vessels, 28mm R A1 2 junction aneurysm. Luminal irregularity of all intracranial vessels consistent with atherosclerosis.   Clinical Impression   Pt admitted with the above diagnoses and presents with below problem list. Pt will benefit from continued acute OT to address the below listed deficits and maximize independence with basic ADLs prior to d/c to next venue. PTA pt was independent with ADLs but noted to have frequent episodes of bladder incontinence per pt and daughter's report. Pt presents with decreased sitting and standing balance, appearance of L side inattention, and decreased cognition impacting his level of independence with ADLs. Pt is currently min A for most ADLs. Spoke briefly with daughter via phone see below in Home Living section for details.       Follow Up Recommendations  SNF    Equipment Recommendations  Other (comment) (TBD)    Recommendations for Other Services       Precautions / Restrictions Precautions Precautions: Fall Precaution Comments: L inattention noted Restrictions Weight Bearing Restrictions: No      Mobility Bed Mobility               General bed mobility comments: Patient in recliner before and after session  Transfers Overall transfer level: Needs assistance Equipment used: Rolling walker (2 wheeled) Transfers: Sit to/from [facility] Sit to Stand: Min assist Stand pivot transfers: Min assist       General transfer comment: min A for balance with 1 LOB standing at the side of the bed; Some assist to power up from a lower seat surface.    Balance Overall balance assessment:  Needs assistance Sitting-balance support: No upper extremity supported;Feet supported Sitting balance-Leahy Scale: Fair Sitting balance - Comments: decreased dynamic balance; pt with posterior lean during UB dressing and LB bathing; Min A to maintain sitting balance Postural control: Posterior lean Standing balance support: During functional activity;Bilateral upper extremity supported Standing balance-Leahy Scale: Poor Standing balance comment: 1 LOB in standing at bedside, min A to facilitate pt correcting balance                            ADL Overall ADL's : Needs assistance/impaired Eating/Feeding: Sitting;Minimal assistance;Set up   Grooming: Sitting;Minimal assistance   Upper Body Bathing: Minimal assitance;Sitting   Lower Body Bathing: Sit to/from stand;Moderate assistance Lower Body Bathing Details (indicate cue type and reason): decreased dynamic sitting balance Upper Body Dressing : Sitting;Minimal assistance Upper Body Dressing Details (indicate cue type and reason): decreased dynamic sitting balance Lower Body Dressing: Moderate assistance;Sit to/from stand Lower Body Dressing Details (indicate cue type and reason): decreased dynamic sitting balance Toilet Transfer: Minimal assistance;Ambulation;RW;BSC   Toileting- Clothing Manipulation and Hygiene: Minimal assistance;Sit to/from stand   Tub/ Shower Transfer: Minimal assistance;Ambulation;3 in 1;Rolling walker   Functional mobility during ADLs: Minimal assistance;Rolling walker General ADL Comments: Pt incontinent of bladder 2x during session. Pt needed min A  to bathe lower body sit<>stand  due to balance. Pt with decreased dynamic sitting and standing balance and appears to have some L inattentions impacting level of assistance with ADLs.      Vision Vision Assessment?: Yes Eye Alignment:  Impaired (comment) (some endotropia noted in L eye while tracking) Ocular Range of Motion: Other (comment);Within  Functional Limits (decreased smoothness tracking to L side) Alignment/Gaze Preference: Head turned;Other (comment) (head turned to right ) Tracking/Visual Pursuits: Decreased smoothness of eye movement to LEFT superior field;Decreased smoothness of eye movement to LEFT inferior field;Requires cues, head turns, or add eye shifts to track Additional Comments: pt appears to have some L side inattention; unable to sustain a smooth tracking pattern on L side   Perception     Praxis      Pertinent Vitals/Pain Pain Assessment: No/denies pain     Hand Dominance Right   Extremity/Trunk Assessment Upper Extremity Assessment Upper Extremity Assessment: LUE deficits/detail LUE Deficits / Details: decreased speed with movements in LUE LUE Coordination: decreased fine motor   Lower Extremity Assessment Lower Extremity Assessment: Defer to PT evaluation       Communication Communication Communication: Other (comment) (slurred, quiet speech)   Cognition Arousal/Alertness: Awake/alert;Lethargic (eyes closing at times during session) Behavior During Therapy: Flat affect (teary-eyed) Overall Cognitive Status: No family/caregiver present to determine baseline cognitive functioning Area of Impairment: Orientation;Attention;Memory;Safety/judgement;Awareness Orientation Level: Time;Situation (able to state name, DOB, location) Current Attention Level: Sustained Memory: Decreased short-term memory   Safety/Judgement: Decreased awareness of safety;Decreased awareness of deficits     General Comments: Patient with limited eye contact When asked why he wasat the hospital he stated "slurred speech"; Decreased ability to follow directions during assessment.Impulsive at times (initiating standing when told to wait) Pt unable to correctly dial daughter's phone number.   General Comments       Exercises       Shoulder Instructions      Home Living Family/patient expects to be discharged to::  Private residence Living Arrangements: Alone Available Help at Discharge: Family;Available 24 hours/day;Other (Comment) (see comment below) Type of Home: Other(Comment) (pt reports apt, daughter reports hotel) Home Access: Level entry;Other (comment) (per pt report)     Home Layout: One level     Bathroom Shower/Tub: Tub/shower unit         Home Equipment: None   Additional Comments: Daughter called while therapist in the room at the start of session. Pt passed the phone to therapist; spoke with daughter on the phone. She stated that she is at home with her 4 children, does not work outside the home and is able to provide 24 hr supervision. When presented with SNF option daughter stated "It's up the him". Daughter inquired if pt had had a stroke.  Lives With: Alone    Prior Functioning/Environment Level of Independence: Needs assistance    ADL's / Homemaking Assistance Needed: incontinent of bladder per pt and daughter report; independent with other ADLs        OT Diagnosis: Cognitive deficits;Generalized weakness;Disturbance of vision   OT Problem List: Decreased activity tolerance;Impaired balance (sitting and/or standing);Impaired vision/perception;Decreased coordination;Decreased cognition;Decreased safety awareness;Decreased knowledge of use of DME or AE;Decreased knowledge of precautions;Impaired UE functional use   OT Treatment/Interventions: Self-care/ADL training;Neuromuscular education;Therapeutic exercise;DME and/or AE instruction;Energy conservation;Therapeutic activities;Cognitive remediation/compensation;Visual/perceptual remediation/compensation;Patient/family education;Balance training    OT Goals(Current goals can be found in the care plan section) Acute Rehab OT Goals Patient Stated Goal: not stated OT Goal Formulation: With patient Time For Goal Achievement: 10/28/14 Potential to Achieve Goals: Good ADL Goals Pt Will Perform Grooming: with modified  independence;sitting;with adaptive equipment Pt Will Perform Upper Body Bathing: with adaptive equipment;sitting;with supervision Pt Will Perform Lower Body Bathing: with supervision;with adaptive equipment;sit to/from stand Pt  Will Perform Upper Body Dressing: with supervision;with adaptive equipment;sitting Pt Will Perform Lower Body Dressing: with supervision;with adaptive equipment;sit to/from stand Pt Will Transfer to Toilet: with supervision;ambulating (3n1 over toilet) Pt Will Perform Toileting - Clothing Manipulation and hygiene: with supervision;with adaptive equipment;sit to/from stand  OT Frequency: Min 3X/week   Barriers to D/C:            Co-evaluation              End of Session Equipment Utilized During Treatment: Gait belt;Rolling walker Nurse Communication: Other (comment) (daughter's contact info on white board; incontinent 2x)  Activity Tolerance: Patient tolerated treatment well Patient left: in chair;with call bell/phone within reach;with chair alarm set   Time: [phone] OT Time Calculation (min): 57 min Charges:  OT General Charges $OT Visit: 1 Procedure OT Evaluation $Initial OT Evaluation Tier I: 1 Procedure OT Treatments $Self Care/Home Management : 8-22 mins G-Codes:    Hortencia Pilar 2014-11-01, 11:50 AM

## 2014-10-21 NOTE — Progress Notes (Signed)
STROKE TEAM PROGRESS NOTE   HISTORY Norman Mendez is an 69 y.o. male with known hyperlipidemia, non STEMI, CVA (no known date and states no residual symptoms) and HTN. Patient felt normal on the night of 10/17/2014 at the time he went to sleep. He awoke the next morning and noted his speech was slurred. He denies any other symptoms. He did not notify anyone as he thought it may get better. Today he was talking with his daughter on the phone and she also noted the speech was slurred. EMS was called to his apartment and he was brought to ED. Initial CT head shows no acute infarct but does note new bilateral infarcts which appear chronic when compared to previous CT scans. Currently he has no complaints other than slurred speech. He states he takes ASA daily and does not miss a dose.   Date last known well: Date: 10/17/2014 Time last known well: Time: 23:00 tPA Given: No: out of window  He was admitted to the neuro 4 N for further evaluation and treatment.   SUBJECTIVE (INTERVAL HISTORY) No family members present. The patient is without complaints. He still has left-sided weakness although this is improving.  OBJECTIVE Temp:  [97.5 F (36.4 C)-98.7 F (37.1 C)] 98.7 F (37.1 C) (02/24 0622) Pulse Rate:  [73-91] 77 (02/24 0622) Cardiac Rhythm:  [-] Normal sinus rhythm (02/23 2000) Resp:  [18] 18 (02/24 0622) BP: (145-179)/(83-99) 160/89 mmHg (02/24 0622) SpO2:  [97 %-100 %] 98 % (02/24 0622)  No results for input(s): GLUCAP in the last 168 hours.  Recent Labs Lab 10/19/14 1430 10/19/14 1545 10/20/14 0438  NA 139 140 138  K 6.5* 3.7 3.9  CL 103 104 105  CO2  --  27 30  GLUCOSE 93 96 108*  BUN 10 7 8   CREATININE 0.90 0.87 0.92  CALCIUM  --  9.1 8.9    Recent Labs Lab 10/19/14 1545 10/20/14 0438  AST 20 15  ALT 21 15  ALKPHOS 93 85  BILITOT 0.9 1.2  PROT 7.3 6.9  ALBUMIN 3.5 3.3*    Recent Labs Lab 10/19/14 1120 10/19/14 1430 10/20/14 0438  WBC 5.7  --   5.7  NEUTROABS 3.0  --  2.8  HGB 14.1 16.3 13.8  HCT 41.6 48.0 41.5  MCV 86.1  --  88.7  PLT 288  --  231   No results for input(s): CKTOTAL, CKMB, CKMBINDEX, TROPONINI in the last 168 hours.  Recent Labs  10/19/14 1120  LABPROT 13.4  INR 1.01    Recent Labs  10/19/14 1512  COLORURINE YELLOW  LABSPEC 1.017  PHURINE 6.0  GLUCOSEU NEGATIVE  HGBUR NEGATIVE  BILIRUBINUR NEGATIVE  KETONESUR NEGATIVE  PROTEINUR NEGATIVE  UROBILINOGEN 0.2  NITRITE NEGATIVE  LEUKOCYTESUR NEGATIVE       Component Value Date/Time   CHOL 255* 10/19/2014 1900   TRIG 71 10/19/2014 1900   HDL 41 10/19/2014 1900   CHOLHDL 6.2 10/19/2014 1900   VLDL 14 10/19/2014 1900   LDLCALC 200* 10/19/2014 1900   Lab Results  Component Value Date   HGBA1C 6.3* 10/19/2014      Component Value Date/Time   LABOPIA NONE DETECTED 10/19/2014 1511   COCAINSCRNUR NONE DETECTED 10/19/2014 1511   LABBENZ NONE DETECTED 10/19/2014 1511   AMPHETMU NONE DETECTED 10/19/2014 1511   THCU NONE DETECTED 10/19/2014 1511   LABBARB NONE DETECTED 10/19/2014 1511     Recent Labs Lab 10/19/14 1505  ETH <5    Ct Head  Wo Contrast 10/19/2014    Chronic ischemic changes are noted. There are new bilateral infarcts which have a chronic appearance. This is in comparison to the prior study from 2013.    13:58   Mr Jodene Mendez Head Wo Contrast 10/19/2014     MRI HEAD:  Acute RIGHT basal ganglia infarct.  Remote bilateral basal ganglia and thalamus lacunar infarcts. Severe white matter changes suggest chronic small vessel ischemic disease.    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  Mid high-grade stenosis of RIGHT M1 segment, moderate LEFT M1 segment stenosis.  High-grade stenosis of RIGHT proximal A2 segment, mid grade stenosis of proximal LEFT A2 segment.  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   High-grade stenosis versus focal occlusion of the bilateral mid to distal P2 segments with suspected collateralization. Moderate stenosis of the basilar artery.  Luminal irregularity of all intracranial vessels consistent with atherosclerosis.       PHYSICAL EXAM HEENT- Normocephalic, no lesions, without obvious abnormality. Normal external eye and conjunctiva. Normal TM's bilaterally. Normal auditory canals and external ears. Normal external nose, mucus membranes and septum. Normal pharynx. Cardiovascular- S1, S2 normal, pulses palpable throughout  Lungs- chest clear, no wheezing, rales, normal symmetric air entry Abdomen- normal findings: bowel sounds normal Extremities- no edema Lymph-no adenopathy palpable Musculoskeletal-no joint tenderness, deformity or swelling Skin-warm and dry, no hyperpigmentation, vitiligo, or suspicious lesions  Neurological Examination Mental Status: Alert, oriented, thought content appropriate. Speech mildly dysarthric without evidence of aphasia. Able to follow 3 step commands without difficulty. Cranial Nerves: II: Discs flat bilaterally; Visual fields grossly normal, pupils equal, round, reactive to light and accommodation III,IV, VI: ptosis not present, extra-ocular motions intact bilaterally V,VII: smile asymmetric with left facial droop[, facial light touch sensation normal bilaterally VIII: hearing normal bilaterally IX,X: gag reflex present XI: bilateral shoulder shrug XII: midline tongue extension Motor: Right :Upper extremity 5/5Left: Upper extremity 4/5 Lower extremity 5/5Lower extremity 5/5 --non drift noted in UE or LE Tone and bulk:normal tone throughout; no atrophy noted Sensory: Light touch intact throughout, bilaterally Cerebellar: normal finger-to-nose, and normal heel-to-shin test Gait: not tested.  ASSESSMENT/PLAN [name] is a 69 y.o. male with history ofhyperlipidemia, non STEMI, CVA and  HTN. presenting with slurred speech He did not receive IV t-PA due to outside of window.   Stroke acute right basal ganglia infarct.  Remote bilateral basal ganglia and thalamus lacunar infarcts secondary to large vessel disease.  Resultant  Residual slurred speech  MRI  Acute RIGHT basal ganglia infarct.  Remote bilateral basal ganglia and thalamus lacunar infarcts. Severe white matter changes suggest chronic small vessel ischemic disease.   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  Mid high-grade stenosis of RIGHT M1 segment, moderate LEFT M1 segment stenosis.  High-grade stenosis of RIGHT proximal A2 segment, mid grade stenosis of proximal LEFT A2 segment.  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  High-grade stenosis versus focal occlusion of the bilateral mid to distal P2 segments with suspected collateralization. Moderate stenosis of the basilar artery.   Carotid Doppler Bilateral: 1-39% ICA stenosis. Vertebral artery flow is antegrade.   2D Echo - EF 60 to 65%.  No cardiac source of emboli identified.  LDL 200  HgbA1c 6.3  Heparin for VTE  prophylaxis  Diet Heart thin liquids  aspirin 81 mg orally every day prior to admission, now on aspirin 81 mg orally every day and clopidogrel 75 mg orally every day for 3 months.  Patient counseled to be compliant with his antithrombotic medications  Ongoing aggressive stroke risk factor management  Therapy recommendations:  PT - HH PT with 24 hour per day supervision. OT - SNF  Disposition: pending  Hypertension Home meds:  Lisinopril, Lopressor Permissive hypertension <220/120 for 24-48 hours and then gradually normalize within 5-7 days Patient counseled to be compliant with his blood pressure medications  Hyperlipidemia  Home meds:  Lipitor 80 mgs  resumed in hospital - question compliance  LDL 200, goal <  70  Now on Lipitor 80 mg daily  Continue statin at discharge  Could consider new injectable  PCS 9 inhibitor (Repatha or Praluent) as an outpatient  Diabetes  HgbA1c 6.3, goal < 7.0  Other Stroke Risk Factors  Smokes one cigarette a day  Obesity, Body mass index is 27.94 kg/(m^2).   H/O CVA  The stroke team will sign off at this time. Please call if we can be of further service. Follow-up with Dr. Leonie Man in 2 months   Hospital day # [facility] Pager [phone] 10/21/2014, 9:18 AM    I have personally examined this patient, reviewed notes, independently viewed imaging studies, participated in medical decision making and plan of care. I have made any additions or clarifications directly to the above note. Agree with note above. He has right BG infarct secondary to severe intracranial atherosclerosis. He is at recurrent risk for strokes and TIAs and needs to continue ongoing stroke evaluation and maintain aggressive risk factor modification. Patient counseled to quit smoking, exercise, diet and maintain ideal body weight and keep blood pressure and cholesterol under control. Stroke team will sign off. Kindly call for questions. Follow-up as an outpatient in stroke clinic in 2 months Antony Contras, MD   To contact Stroke Continuity provider, please refer to [email or web address] After hours, contact General Neurology

## 2014-10-22 MED ORDER — LISINOPRIL 10 MG PO TABS: 10.0000 mg | ORAL_TABLET | Freq: Every day | ORAL | Status: DC

## 2014-10-22 MED ORDER — CLOPIDOGREL BISULFATE 75 MG PO TABS: 75.0000 mg | ORAL_TABLET | Freq: Every day | ORAL | Status: DC

## 2014-10-22 NOTE — Progress Notes (Signed)
Talked to patient about DCP; patient will live with his daughter at discharge; [facility] choices offer, patient chose [facility] for home health care services; [facility] referral ( [facility]); Telephone call to daughter Brooke Pace [phone]) to verify that the patient will be staying with his daughter and to expect a call from [facility]; DME ordered and will be delivered to the room prior to discharging home today; Aneta Mins [phone]

## 2014-10-22 NOTE — Discharge Instructions (Signed)
You were admitted for slurred speech and found to have an acute stroke.  You are being sent home on a medication to take called Plavix.  You will take this AND a daily asprin 81mg  for the next 3 months.  You should schedule an appointment with the Neurologist to be seen in 2 months.  You have an appointment to see your primary doctor at the family practice center in March.  Please make sure to go to these appointments.  Also, we have increased your Lisinopril to 10mg  tablets to help control your blood pressure better.   Ischemic Stroke Blood carries oxygen to all areas of your body. A stroke happens when your blood does not flow to your brain like normal. If this happens, your brain will not get the oxygen it needs and brain tissue will die. This is an emergency. Problems (symptoms) of a stroke usually happen suddenly. You may notice them when you wake up. They can include:  Loss of feeling or weakness on one side of the body (face, arm, leg).  Feeling confused.  Trouble talking or understanding.  Trouble seeing.  Trouble walking.  Feeling dizzy.  Loss of balance or coordination.  Severe headache without a cause.  Trouble reading or writing. Get help as soon as any of these problems first start. This is important.  RISK FACTORS  Risk factors are things that make it more likely for you to have a stroke. These things include:  High blood pressure (hypertension).  High cholesterol.  Diabetes.  Heart disease.  Having a buildup of fatty deposits in the blood vessels.  Having an abnormal heart rhythm (atrial fibrillation).  Being very overweight (obese).  Smoking.  Taking birth control pills, especially if you smoke.  Not being active.  Having a diet high in fats, salt, and calories.  Drinking too much alcohol.  Using illegal drugs.  Being African American.  Being over the age of 61.  Having a family history of stroke.  Having a history of blood clots, stroke,  warning stroke (transient ischemic attack, TIA), or heart attack.  Sickle cell disease. HOME CARE  Take all medicines exactly as told by your doctor. Understand all your medicine instructions.  You may need to take a medicine to thin your blood, like aspirin or warfarin. Take warfarin exactly as told.  Taking too much or too little warfarin is dangerous. Get regular blood tests as told, including the PT and INR tests. The test results help your doctor adjust your dose of warfarin. Your PT and INR levels must be done as often as told by your doctor.  Food can cause problems with warfarin and affect the results of your blood tests. This is true for foods high in vitamin K, such as spinach, kale, broccoli, cabbage, collard and turnip greens, Brussels sprouts, peas, cauliflower, seaweed, and parsley, as well as beef and pork liver, green tea, and soybean oil. Eat the same amount of food high in vitamin K. Avoid major changes in your diet. Tell your doctor before changing your diet. Talk to a food specialist (dietitian) if you have questions.  Many medicines can cause problems with warfarin and affect your PT and INR test results. Tell your doctor about all medicines you take. This includes vitamins and dietary pills (supplements). Be careful with aspirin and medicines that relieve redness, soreness, and puffiness (inflammation). Do not take or stop medicines unless your doctor tells you to.  Warfarin can cause a lot of bruising or bleeding.  Hold pressure over cuts for longer than normal. Talk to your doctor about other side effects of warfarin.  Avoid sports or activities that may cause injury or bleeding.  Be careful when you shave, floss your teeth, or use sharp objects.  Avoid alcoholic drinks or drink very little alcohol while taking warfarin. Tell your doctor if you change how much alcohol you drink.  Tell your dentist and other doctors that you take warfarin before procedures.  If you are  able to swallow, eat healthy foods. Eat 5 or more servings of fruits and vegetables a day. Eat soft foods, pureed foods, or eat small bites of food so you do not choke.  Follow your diet program as told, if you are given one.  Keep a healthy weight.  Stay active. Try to get at least 30 minutes of activity on most or all days.  Do not smoke.  Limit how much alcohol you drink even if you are not taking warfarin. Moderate alcohol use is:  No more than 2 drinks each day for men.  No more than 1 drink each day for women who are not pregnant.  Keep your home safe so you do not fall. Try:  Putting grab bars in the bedroom and bathroom.  Raising toilet seats.  Putting a seat in the shower.  Go to therapy sessions (physical, occupational, and speech) as told by your doctor.  Use a walker or cane at all times if told to do so.  Keep all doctor visits as told. GET HELP IF:  Your personality changes.  You have trouble swallowing.  You are seeing two of everything.  You are dizzy.  You have a fever.  Your skin starts to break down. GET HELP RIGHT AWAY IF:  The symptoms below may be a sign of an emergency. Do not wait to see if the symptoms go away. Call for help (911 in [place] Do not drive yourself to the hospital.  You have sudden weakness or numbness on the face, arm, or leg (especially on one side of the body).  You have sudden trouble walking or moving your arms or legs.  You have sudden confusion.  You have trouble talking or understanding.  You have sudden trouble seeing in one or both eyes.  You lose your balance or your movements are not smooth.  You have a sudden, severe headache with no known cause.  You have new chest pain or you feel your heart beating in an unsteady way.  You are partly or totally unaware of what is going on around you. Document Released: 08/03/2011 Document Revised: 12/29/2013 Document Reviewed: 03/24/2012 [facility] Patient Information  2015 [place], [place]. This information is not intended to replace advice given to you by your health care provider. Make sure you discuss any questions you have with your health care provider.

## 2014-10-22 NOTE — Progress Notes (Signed)
Physical Therapy Treatment Patient Details Name: Norman Mendez MRN: [id number] DOB: [birth year] Today's Date: 10/22/2014    History of Present Illness Patient is a 69 y/o male presenting with slurred speech. PMH of NSTEMI, HLD, HTN. Head CT (-). MRI head- + acute R basal ganglia infarct. MRA head- high grade stenosis of multiple vessels, 62mm R A1 2 junction aneurysm. Luminal irregularity of all intracranial vessels consistent with atherosclerosis.    PT Comments    Patient progressing slowly with mobility. Improved ambulation distance today but continues to require hands on assist at times due to impaired balance with head turns. Very slow gait speed putting pt at increased risk for falls. Pt and daughter refusing SNF placement at this time. Pt will need assist with transfers and close min guard for ambulation for safety at home and 24/7 S. Will continue to follow acutely.    Follow Up Recommendations  Supervision/Assistance - 24 hour;Home health PT     Equipment Recommendations  Rolling walker with 5" wheels    Recommendations for Other Services       Precautions / Restrictions Precautions Precautions: Fall Precaution Comments: L inattention noted Restrictions Weight Bearing Restrictions: No    Mobility  Bed Mobility Overal bed mobility: Needs Assistance Bed Mobility: Supine to Sit     Supine to sit: Min guard;HOB elevated     General bed mobility comments: Increased time and effort to get to EOB but no physical assist needed.   Transfers Overall transfer level: Needs assistance Equipment used: Rolling walker (2 wheeled) Transfers: Sit to/from Stand Sit to Stand: Min assist         General transfer comment: Min A to rise from EOB with assist to scoot bottom to EOB. Increased time. Cues for hand placement.   Ambulation/Gait Ambulation/Gait assistance: Min assist Ambulation Distance (Feet): 200 Feet Assistive device: Rolling walker (2 wheeled) Gait  Pattern/deviations: Step-to pattern;Step-through pattern;Decreased stride length;Trunk flexed;Shuffle;Narrow base of support Gait velocity: .6 ft/sec Gait velocity interpretation: <1.8 ft/sec, indicative of risk for recurrent falls General Gait Details: Pt with slow, unsteady gait with tendency to ambulate towards left side of RW hitting walker legs x2. Cues for RW proximity/management. Min A at times during head movements due to left lateral lean.   Stairs            Wheelchair Mobility    Modified Rankin (Stroke Patients Only) Modified Rankin (Stroke Patients Only) Pre-Morbid Rankin Score: Slight disability Modified Rankin: Moderately severe disability     Balance Overall balance assessment: Needs assistance Sitting-balance support: Feet supported;No upper extremity supported Sitting balance-Leahy Scale: Fair     Standing balance support: During functional activity Standing balance-Leahy Scale: Poor                      Cognition Arousal/Alertness: Awake/alert Behavior During Therapy: Flat affect Overall Cognitive Status: No family/caregiver present to determine baseline cognitive functioning Area of Impairment: Safety/judgement         Safety/Judgement: Decreased awareness of safety;Decreased awareness of deficits          Exercises      General Comments        Pertinent Vitals/Pain Pain Assessment: No/denies pain    Home Living                      Prior Function            PT Goals (current goals can now be found in the care plan  section) Progress towards PT goals: Progressing toward goals    Frequency  Min 4X/week    PT Plan Current plan remains appropriate    Co-evaluation             End of Session Equipment Utilized During Treatment: Gait belt Activity Tolerance: Patient tolerated treatment well Patient left: in chair;with call bell/phone within reach;with chair alarm set     Time: 0941-1000 PT Time  Calculation (min) (ACUTE ONLY): 19 min  Charges:  $Gait Training: 8-22 mins                    G CodesCandy Sledge A 10-25-14, 10:13 AM  Candy Sledge, PT, DPT [phone]

## 2014-10-22 NOTE — Progress Notes (Signed)
D/C orders received. Pt and daughters educated on d/c instructions and stroke education. Verbalized understanding. IV and tele removed. Pt's daughter handed d/c packet. Pt given back belongings from security. Pt and home health equipment taken downstairs by staff.

## 2014-11-03 ENCOUNTER — Encounter: Payer: Self-pay | Admitting: Family Medicine

## 2014-11-03 ENCOUNTER — Ambulatory Visit (INDEPENDENT_AMBULATORY_CARE_PROVIDER_SITE_OTHER): Payer: Medicare Other | Admitting: Family Medicine

## 2014-11-03 VITALS — BP 145/96 | HR 96 | Temp 97.4°F | Ht 66.0 in | Wt 155.7 lb

## 2014-11-03 DIAGNOSIS — I6330 Cerebral infarction due to thrombosis of unspecified cerebral artery: Secondary | ICD-10-CM

## 2014-11-03 DIAGNOSIS — I10 Essential (primary) hypertension: Secondary | ICD-10-CM | POA: Diagnosis not present

## 2014-11-03 DIAGNOSIS — R4781 Slurred speech: Secondary | ICD-10-CM | POA: Diagnosis not present

## 2014-11-03 NOTE — Progress Notes (Signed)
Patient ID: Norman Mendez, male   DOB: Jun 27, 1946, 69 y.o.   MRN: [id number]    Subjective: [id number] follow up for stroke HPI: Patient is a 69 y.o. male presenting to clinic today for hospital f/u.  Accompanied to appointment by his older daughter, Norman Mendez [phone]  Concerns today include:  Stroke Patient reports that he has been doing ok since discharged from hospital.  He is residing with his younger daughter.  His older daughter, Cecelia Mendez, reports that her sister declined [place] services when they arrived to her home because she thought that they were there solely to help with bathing/ADLs.  We discussed that he had SLP, PT, OT, RN and an aide ordered to help him safely adapt to new home and strengthen himself after his stroke.  Daughter asked that I see if this can be re-ordered for him, as they do want these services for their father.  His daughter reports that he continues to be unsteady on his feet.  However, is able to ambulate with walker with no falls.  She notes that he has fallen out of bed before but has never hit his head.  Patient also reports that he continues to have slurred speech.  We discussed that he may see improvement up to 6 months after stroke event.  Informed him that SLP will be very helpful to him.  Daughter also reports to me that patient has not been taking ASA, as younger sister did not realize he was supposed to be on BOTH Plavix and ASA.  She voices that she will buy this today.  Denies headache, increased weakness or any other neurological changes since hospitalization.    HTN Does not monitor BP at home.  Compliant with medications.  No headache, vision changes, CP, SOB.  Social History Reviewed: non smoker. FamHx and MedHx updated.  Please see EMR. Health Maintenance: Colonoscopy due  ROS: All other systems reviewed and are negative.  Objective: Office vital signs reviewed. BP 145/96 mmHg  Pulse 96  Temp(Src) 97.4 F (36.3 C) (Oral)  Ht 5\' 6"   (1.676 m)  Wt 155 lb 11.2 oz (70.625 kg)  BMI 25.14 kg/m2  Physical Examination:  General: Awake, alert, well nourished male, NAD HEENT: Normal, EOMI, PERRLA Extremities: WWP, No edema, cyanosis or clubbing; +2 pulses bilaterally MSK: slow, unsteady gait and station, patient is unable to sit erect without assistance Skin: dry, intact, no rashes or lesions Neuro: L sided facial droop, patient tends to speak out of the right side of his mouth, CN's otherwise in tact, LE/UE Strength 5/5   Assessment: 69 y.o. male following up after a stroke  Plan: See Problem List and After Visit Summary  **Total time spent with patient 25 minutes.  Greater than 50% of encounter spent in coordination of care/counseling.   Janora Norlander, DO PGY-1, [place]

## 2014-11-03 NOTE — Assessment & Plan Note (Signed)
Continues to have slurred speech 2/2 to stroke in February [facility] SLP ordered -Discussed with patient that this may improve up to 6 months after stroke event

## 2014-11-03 NOTE — Assessment & Plan Note (Addendum)
Patient doing ok.  Has not been on ASA since hospitalization -Daughter voices that she will obtain ASA 81mg  today. -HH reordered [facility]) for OT/PT, SLP, RN, RN aide -Fall precautions/safe ambulation discussed and in ADL -Patient to be seen by Dr Leonie Man within the next 2 months.  Daughter will make sure that appointment has been made -Patient to continue on ASA and Plavix at least until May 2016 per Neuro.  Defer to Neuro, should they recommend patient to continue this regimen beyond May. -Continue ACE-I, BB, Statin -Will check a direct LDL at next appointment -Follow up in 2 months after seen by Neuro.

## 2014-11-03 NOTE — Assessment & Plan Note (Signed)
Does not monitor BP at home.  Is compliant with BP medications.  These were reviewed with both daughters.  Patient diastolic slightly elevated. No red flags. -Continue Metoprolol, Lisinopril -Encouraged at home BP monitoring -HH RN for BP checks/medication management

## 2014-11-03 NOTE — Patient Instructions (Signed)
It was a pleasure seeing you today, Norman Mendez!  Information regarding what we discussed is included in this packet.  Please make an appointment to see me in 2 months. Please schedule an appointment to see the Neurologist, Dr Leonie Man within the next month ph# [phone].  Home health has been called for you for reinstatement of home health services to include Physical therapy, speech therapy, occupational therapy and home health nurse to help with medications.  Please remember to pick up baby asprin.  This should be taken with Plavix until at least May, maybe longer depending on what the neurologist says.  Please feel free to call our office at [phone] if any questions or concerns arise.  Warm Regards, Xeng Kucher M. [name], DO  Fall Prevention and Home Safety Falls cause injuries and can affect all age groups. It is possible to prevent falls.  HOW TO PREVENT FALLS  Wear shoes with rubber soles that do not have an opening for your toes.  Keep the inside and outside of your house well lit.  Use night lights throughout your home.  Remove clutter from floors.  Clean up floor spills.  Remove throw rugs or fasten them to the floor with carpet tape.  Do not place electrical cords across pathways.  Put grab bars by your tub, shower, and toilet. Do not use towel bars as grab bars.  Put handrails on both sides of the stairway. Fix loose handrails.  Do not climb on stools or stepladders, if possible.  Do not wax your floors.  Repair uneven or unsafe sidewalks, walkways, or stairs.  Keep items you use a lot within reach.  Be aware of pets.  Keep emergency numbers next to the telephone.  Put smoke detectors in your home and near bedrooms. Ask your doctor what other things you can do to prevent falls. Document Released: 06/10/2009 Document Revised: 02/13/2012 Document Reviewed: 11/14/2011 [facility] Patient Information 2015 [place], [place]. This information is not intended to  replace advice given to you by your health care provider. Make sure you discuss any questions you have with your health care provider.

## 2014-11-03 NOTE — Progress Notes (Signed)
I was the preceptor for this visit. 

## 2014-11-09 ENCOUNTER — Telehealth: Payer: Self-pay | Admitting: Family Medicine

## 2014-11-09 NOTE — Telephone Encounter (Signed)
Needs referral for colonoscopy / thanks sr

## 2014-11-10 ENCOUNTER — Other Ambulatory Visit: Payer: Self-pay | Admitting: Family Medicine

## 2014-11-10 DIAGNOSIS — Z0000 Encounter for general adult medical examination without abnormal findings: Secondary | ICD-10-CM

## 2014-12-16 ENCOUNTER — Other Ambulatory Visit: Payer: Self-pay | Admitting: Family Medicine

## 2014-12-16 DIAGNOSIS — I6330 Cerebral infarction due to thrombosis of unspecified cerebral artery: Secondary | ICD-10-CM

## 2014-12-16 MED ORDER — LISINOPRIL 10 MG PO TABS: 10.0000 mg | ORAL_TABLET | Freq: Every day | ORAL | Status: DC

## 2015-01-04 ENCOUNTER — Ambulatory Visit: Payer: Medicare Other | Admitting: Gastroenterology

## 2015-02-02 ENCOUNTER — Ambulatory Visit: Payer: Self-pay | Admitting: Neurology

## 2015-02-03 ENCOUNTER — Encounter: Payer: Self-pay | Admitting: Neurology

## 2015-06-28 ENCOUNTER — Ambulatory Visit (INDEPENDENT_AMBULATORY_CARE_PROVIDER_SITE_OTHER): Payer: Medicare Other | Admitting: Family Medicine

## 2015-06-28 ENCOUNTER — Encounter: Payer: Self-pay | Admitting: Family Medicine

## 2015-06-28 VITALS — BP 142/70 | HR 90 | Temp 98.9°F | Wt 150.3 lb

## 2015-06-28 DIAGNOSIS — M7582 Other shoulder lesions, left shoulder: Secondary | ICD-10-CM

## 2015-06-28 DIAGNOSIS — K219 Gastro-esophageal reflux disease without esophagitis: Secondary | ICD-10-CM | POA: Diagnosis not present

## 2015-06-28 DIAGNOSIS — I10 Essential (primary) hypertension: Secondary | ICD-10-CM | POA: Diagnosis not present

## 2015-06-28 DIAGNOSIS — I6330 Cerebral infarction due to thrombosis of unspecified cerebral artery: Secondary | ICD-10-CM

## 2015-06-28 DIAGNOSIS — M79602 Pain in left arm: Secondary | ICD-10-CM

## 2015-06-28 MED ORDER — MELOXICAM 7.5 MG PO TABS: 7.5000 mg | ORAL_TABLET | Freq: Every day | ORAL | Status: DC

## 2015-06-28 MED ORDER — ESOMEPRAZOLE MAGNESIUM 20 MG PO CPDR: 20.0000 mg | DELAYED_RELEASE_CAPSULE | Freq: Every day | ORAL | Status: DC

## 2015-06-28 MED ORDER — IBUPROFEN 600 MG PO TABS: 600.0000 mg | ORAL_TABLET | Freq: Three times a day (TID) | ORAL | Status: DC | PRN

## 2015-06-28 MED ORDER — LISINOPRIL 10 MG PO TABS: 10.0000 mg | ORAL_TABLET | Freq: Every day | ORAL | Status: DC

## 2015-06-28 MED ORDER — METOPROLOL TARTRATE 25 MG PO TABS: 25.0000 mg | ORAL_TABLET | Freq: Two times a day (BID) | ORAL | Status: DC

## 2015-06-28 NOTE — Patient Instructions (Addendum)
I think that your rotator cuff may be sprained.  It does not appear that you have a tear.  I want you to keep your shoulder mobile.  I am giving you some exercises to help your shoulder.  If you start getting worse weakness, numbness or tingling or the shoulder is more painful, see [place] back immediately.  I have also placed a referral to sports medicine for a possible steroid injection into your shoulder. Rotator Cuff Tendinitis Rotator cuff tendinitis is inflammation of the tough, cord-like bands that connect muscle to bone (tendons) in your rotator cuff. Your rotator cuff is the collection of all the muscles and tendons that connect your arm to your shoulder. Your rotator cuff holds the head of your upper arm bone (humerus) in the cup (fossa) of your shoulder blade (scapula). CAUSES Rotator cuff tendinitis is usually caused by overusing the joint involved.  SIGNS AND SYMPTOMS  Deep ache in the shoulder also felt on the outside upper arm over the shoulder muscle.  Point tenderness over the area that is injured.  Pain comes on gradually and becomes worse with lifting the arm to the side (abduction) or turning it inward (internal rotation).  May lead to a chronic tear: When a rotator cuff tendon becomes inflamed, it runs the risk of losing its blood supply, causing some tendon fibers to die. This increases the risk that the tendon can fray and partially or completely tear. DIAGNOSIS Rotator cuff tendinitis is diagnosed by taking a medical history, performing a physical exam, and reviewing results of imaging exams. The medical history is useful to help determine the type of rotator cuff injury. The physical exam will include looking at the injured shoulder, feeling the injured area, and watching you do range-of-motion exercises. X-ray exams are typically done to rule out other causes of shoulder pain, such as fractures. MRI is the imaging exam usually used for significant shoulder injuries. Sometimes a  dye study called CT arthrogram is done, but it is not as widely used as MRI. In some institutions, special ultrasound tests may also be used to aid in the diagnosis. TREATMENT  Less Severe Cases  Use of a sling to rest the shoulder for a short period of time. Prolonged use of the sling can cause stiffness, weakness, and loss of motion of the shoulder joint.  Anti-inflammatory medicines, such as ibuprofen or naproxen sodium, may be prescribed. More Severe Cases  Physical therapy.  Use of steroid injections into the shoulder joint.  Surgery. HOME CARE INSTRUCTIONS   Use a sling or splint until the pain decreases. Prolonged use of the sling can cause stiffness, weakness, and loss of motion of the shoulder joint.  Apply ice to the injured area:  Put ice in a plastic bag.  Place a towel between your skin and the bag.  Leave the ice on for 20 minutes, 2-3 times a day.  Try to avoid use other than gentle range of motion while your shoulder is painful. Use the shoulder and exercise only as directed by your health care provider. Stop exercises or range of motion if pain or discomfort increases, unless directed otherwise by your health care provider.  Only take over-the-counter or prescription medicines for pain, discomfort, or fever as directed by your health care provider.  If you were given a shoulder sling and straps (immobilizer), do not remove it except as directed, or until you see a health care provider for a follow-up exam. If you need to remove it, move  your arm as little as possible or as directed.  You may want to sleep on several pillows at night to lessen swelling and pain. SEEK IMMEDIATE MEDICAL CARE IF:   Your shoulder pain increases or new pain develops in your arm, hand, or fingers and is not relieved with medicines.  You have new, unexplained symptoms, especially increased numbness in the hands or loss of strength.  You develop any worsening of the problems that  brought you in for care.  Your arm, hand, or fingers are numb or tingling.  Your arm, hand, or fingers are swollen, painful, or turn white or blue. MAKE SURE YOU:  Understand these instructions.  Will watch your condition.  Will get help right away if you are not doing well or get worse.   This information is not intended to replace advice given to you by your health care provider. Make sure you discuss any questions you have with your health care provider.   Document Released: 11/04/2003 Document Revised: 09/04/2014 Document Reviewed: 03/26/2013 Elsevier Interactive Patient Education [employer].

## 2015-06-28 NOTE — Progress Notes (Signed)
    Subjective: CC: L arm weakness HPI: Patient is a 69 y.o. male presenting to clinic today for same day appt. Concerns today include:  1. L arm weakness Patient reports that he has been having LUE pain and weakness for a few weeks (2-3).  He could not come into clinic earlier, as he was unable to afford a bus ride to the clinic.  He notes difficulty raising L arm up.  He also notes pain when he rolls over on shoulder at night.  No known injury.  No swelling or redness of arm.  No numbness or tingling in LUE, no other new neurologic deficits.  Able to perform ADLs.    2. Hypertension Blood pressure at home: does not monitor Blood pressure today: 158/82, repeat 142/70 Meds: Compliant with Lisinopril, Metoprolol but has not taken today. Side effects: none ROS: Denies headache, dizziness, visual changes, nausea, vomiting, chest pain, abdominal pain or shortness of breath.  3. H/o CVA Patient reports that he is taking his blood pressure meds, statin, aspirin and plavix.  Has not followed up with Neuro.  No new neurologic deficits.  Social History Reviewed: non smoker. FamHx and MedHx updated.  Please see EMR. Health Maintenance: flu shot due  ROS: Per HPI  Objective: Office vital signs reviewed. BP 142/70 mmHg  Pulse 90  Temp(Src) 98.9 F (37.2 C) (Oral)  Wt 150 lb 4.8 oz (68.176 kg)  SpO2 95%  Physical Examination:  General: Awake, alert, thin male, NAD HEENT: Normal, MMM Cardio: Regular rate noted, +2 radial pulses Pulm: normal WOB, no wheeze Extremities: WWP, No edema, cyanosis or clubbing;  Left upper extremity: no tenderness along clavicle or rotator cuff, no erythema or edema, no crepitus, mild decrease in strength in external rotation and flexion.  Patient has full active range of motion in flexion and abduction, positive empty can test, no bony deformities appreciated MSK: Normal gait and station Neuro: upper extremity light touch sensation grossly  intact  Assessment/ Plan: 69 y.o. male with  1. Pain of left upper extremity.  Suspect secondary to rotator cuff tendinitis vs tear.  No bony abnormalities.  Will give a short course of NSAIDs with a PPI.  Considered Motrin but h/o MI.  Will select a COX2-Inhibitor in setting of Aspirin and Plavix use.  Also, will place on a PPI.  Protonix was my first choice but not covered by his insurance.  Nexium selected.   - Referral to Sports medicine for joint injection/ exercises - Rotator cuff strain exercises reviewed and Sports Med Advisor printout provided - meloxicam (MOBIC) 7.5 MG tablet; Take 1 tablet (7.5 mg total) by mouth daily.  Dispense: 15 tablet; Refill: 0 - Return precautions reviewed.  2. Essential hypertension.  Slightly elevated initially.  Repeat WNL.   - metoprolol tartrate (LOPRESSOR) 25 MG tablet; Take 1 tablet (25 mg total) by mouth 2 (two) times daily.  Dispense: 60 tablet; Refill: 5 - lisinopril (PRINIVIL,ZESTRIL) 10 MG tablet; Take 1 tablet (10 mg total) by mouth daily.  Dispense: 90 tablet; Refill: 3  3. Rotator cuff tendinitis, left. As above - Mobic 7.5mg  daily  4. Gastroesophageal reflux disease without esophagitis.  Nexium as prophylaxis in setting of aspirin, plavix and Mobic.  Protonix not covered. - esomeprazole (NEXIUM) 20 MG capsule; Take 1 capsule (20 mg total) by mouth daily at 12 noon.  Dispense: 15 capsule; Refill: 0   Janora Norlander, DO PGY-2, [place]

## 2015-10-19 ENCOUNTER — Telehealth: Payer: Self-pay | Admitting: *Deleted

## 2015-10-19 NOTE — Telephone Encounter (Signed)
Called patient to offer flu vaccine, however, message stated that person is unavailable and no VM set-up. Will make second attempt later.  Velora Heckler, RN

## 2016-04-03 ENCOUNTER — Telehealth: Payer: Self-pay | Admitting: Family Medicine

## 2016-04-03 ENCOUNTER — Other Ambulatory Visit: Payer: Self-pay | Admitting: Family Medicine

## 2016-04-03 DIAGNOSIS — I10 Essential (primary) hypertension: Secondary | ICD-10-CM

## 2016-04-03 MED ORDER — LISINOPRIL 10 MG PO TABS: 10.0000 mg | ORAL_TABLET | Freq: Every day | ORAL | 0 refills | Status: DC

## 2016-04-03 MED ORDER — METOPROLOL TARTRATE 25 MG PO TABS: 25.0000 mg | ORAL_TABLET | Freq: Two times a day (BID) | ORAL | 0 refills | Status: DC

## 2016-04-03 NOTE — Telephone Encounter (Signed)
Patient asks for blood pressure refill until his next appointment (04-19-16 at 11:30). Please, follow up.

## 2016-04-03 NOTE — Telephone Encounter (Signed)
Called pt. No answer, no voicemail. If pt calls, please let him know Rx has been called in. Ottis Stain, CMA

## 2016-04-03 NOTE — Telephone Encounter (Signed)
Done

## 2016-04-19 ENCOUNTER — Encounter: Payer: Self-pay | Admitting: Family Medicine

## 2016-04-19 ENCOUNTER — Ambulatory Visit (INDEPENDENT_AMBULATORY_CARE_PROVIDER_SITE_OTHER): Payer: Medicare Other | Admitting: Family Medicine

## 2016-04-19 VITALS — BP 158/57 | HR 81 | Temp 97.9°F | Ht 66.0 in | Wt 149.8 lb

## 2016-04-19 DIAGNOSIS — M1612 Unilateral primary osteoarthritis, left hip: Secondary | ICD-10-CM

## 2016-04-19 DIAGNOSIS — E785 Hyperlipidemia, unspecified: Secondary | ICD-10-CM | POA: Diagnosis not present

## 2016-04-19 DIAGNOSIS — I10 Essential (primary) hypertension: Secondary | ICD-10-CM | POA: Diagnosis present

## 2016-04-19 DIAGNOSIS — M7989 Other specified soft tissue disorders: Secondary | ICD-10-CM

## 2016-04-19 DIAGNOSIS — Z1159 Encounter for screening for other viral diseases: Secondary | ICD-10-CM | POA: Diagnosis not present

## 2016-04-19 DIAGNOSIS — M799 Soft tissue disorder, unspecified: Secondary | ICD-10-CM

## 2016-04-19 DIAGNOSIS — M1990 Unspecified osteoarthritis, unspecified site: Secondary | ICD-10-CM | POA: Diagnosis not present

## 2016-04-19 MED ORDER — ATORVASTATIN CALCIUM 80 MG PO TABS: 80.0000 mg | ORAL_TABLET | Freq: Every day | ORAL | 4 refills | Status: DC

## 2016-04-19 MED ORDER — LISINOPRIL 10 MG PO TABS: 10.0000 mg | ORAL_TABLET | Freq: Every day | ORAL | 4 refills | Status: DC

## 2016-04-19 MED ORDER — METOPROLOL TARTRATE 25 MG PO TABS
25.0000 mg | ORAL_TABLET | Freq: Two times a day (BID) | ORAL | 4 refills | Status: DC
Start: 2016-04-19 — End: 2016-11-29

## 2016-04-19 NOTE — Patient Instructions (Addendum)
I am referring you to Sports Medicine for ultrasound of what looks to be a ganglion cyst on your shoulder.  You may need to have it removed at orthopedics.  I think that your left hip has arthritis in it.  You can take Tylenol for your hip.  If you develop worsening pain, weakness, numbness/ tingling, I want you to be seen sooner.    You need a LAB appointment for next week.

## 2016-04-19 NOTE — Assessment & Plan Note (Signed)
H/o CVA.  On lipitor 80mg .  Renewed x1 year.

## 2016-04-19 NOTE — Progress Notes (Signed)
    Subjective: CC: HTN HPI: Norman Mendez is a 70 y.o. male presenting to clinic today for follow up. Concerns today include:  1. Hip pain/ Right shoulder mass Left hip started hurting a couple of days ago.  He has not taken any medication for it.  He also notes a knot on his right shoulder as well.  He reports that the shoulder does not hurt.  No numbness/ tingling in leg or shoulder.  He did not injur either.  Reports decreased ROM from right shoulder mass.  He reports that mass has been there for about 1 year.  He is not sure if it is increasing in size.  He has never had imaging done for shoulder.  2. Hypertension Blood pressure today: 158/57 Meds: Reports that he has been out of BP medication for the last month ROS: Denies headache, dizziness, visual changes, nausea, vomiting, chest pain, abdominal pain or shortness of breath.  Social History Reviewed: non smoker. FamHx and MedHx reviewed.  Please see EMR. Health Maintenance: Colonscopy  ROS: Per HPI  Objective: Office vital signs reviewed. BP (!) 158/57   Pulse 81   Temp 97.9 F (36.6 C) (Oral)   Ht 5\' 6"  (1.676 m)   Wt 149 lb 12.8 oz (67.9 kg)   BMI 24.18 kg/m   Physical Examination:  General: Awake, alert, thin elderly male, No acute distress Cardio: regular rate Pulm: normal WOB on room air Extremities: warm, well perfused, No edema, cyanosis or clubbing; +2 pulses bilaterally  Right shoulder: well circumscribed, mobile, non-tender mass that is about 3cm x 3cm over the [facility] joint.  Limited ROM in abduction.    MSK: uses cane for ambulation.  No TTP over left hip.   Assessment/ Plan: 70 y.o. male   Essential hypertension BP 158/57.  No red flags.  Patient off of antihypertensives for last month.  Actually had a new rx called in 8/7 for both meds but was never called by pharmacy.  Renewed both meds today.  Will plan for BMP for Cr check in 1 week since restarting ACE-I.  Discussed with patient.  He will  schedule.  Hyperlipidemia H/o CVA.  On lipitor 80mg .  Renewed x1 year.  Need for hepatitis C screening test - Hepatitis C antibody; Future  Soft tissue mass.  Suspect ganglion cyst.  Will send to sports med for ultrasound evaluation +/- removal.  May need referral to orthopedics for removal.  This was discussed with patient.  Map given to patient with directions.  Scheduled for Friday with Dr Nori Riis. - Ambulatory referral to Sports Medicine  Arthritis of left hip.  Suspect that this is what is causing left hip pain.  No red flags.  Since acute.  Discussed treating with Tylenol arthritis.  If no improvement or worsening symptoms, could consider hip injections. - Ambulatory referral to [place], DO PGY-3, [facility]

## 2016-04-19 NOTE — Assessment & Plan Note (Signed)
BP 158/57.  No red flags.  Patient off of antihypertensives for last month.  Actually had a new rx called in 8/7 for both meds but was never called by pharmacy.  Renewed both meds today.  Will plan for BMP for Cr check in 1 week since restarting ACE-I.  Discussed with patient.  He will schedule.

## 2016-04-21 ENCOUNTER — Ambulatory Visit: Payer: Medicare Other | Admitting: Family Medicine

## 2016-04-25 ENCOUNTER — Other Ambulatory Visit: Payer: Medicare Other

## 2016-04-25 DIAGNOSIS — I10 Essential (primary) hypertension: Secondary | ICD-10-CM | POA: Diagnosis not present

## 2016-04-25 DIAGNOSIS — Z1159 Encounter for screening for other viral diseases: Secondary | ICD-10-CM

## 2016-04-25 LAB — BASIC METABOLIC PANEL WITH GFR
BUN: 16 mg/dL (ref 7–25)
CHLORIDE: 107 mmol/L (ref 98–110)
CO2: 21 mmol/L (ref 20–31)
CREATININE: 1.49 mg/dL — AB (ref 0.70–1.25)
Calcium: 9.7 mg/dL (ref 8.6–10.3)
GFR, Est African American: 55 mL/min — ABNORMAL LOW (ref >=60)
GFR, Est Non African American: 47 mL/min — ABNORMAL LOW (ref >=60)
Glucose, Bld: 110 mg/dL — ABNORMAL HIGH (ref 65–99)
POTASSIUM: 4.6 mmol/L (ref 3.5–5.3)
Sodium: 141 mmol/L (ref 135–146)

## 2016-04-26 ENCOUNTER — Other Ambulatory Visit: Payer: Medicare Other

## 2016-04-26 ENCOUNTER — Telehealth: Payer: Self-pay | Admitting: *Deleted

## 2016-04-26 LAB — HEPATITIS C ANTIBODY: HCV AB: NEGATIVE

## 2016-04-26 NOTE — Telephone Encounter (Signed)
-----   Message from Janora Norlander, DO sent at 04/26/2016  1:55 PM EDT ----- Attempted to call patient re: increased Creatinine.  Patient to discontinue Lisinopril.  Come in for BP check with RN in next 1-2 weeks and will plan to repeat BMP off of Lisinopril.  Please attempt to call patient again today.  If unable to reach, will need to send a certified letter to his home.

## 2016-04-26 NOTE — Telephone Encounter (Signed)
Tried to contact pt to inform him of below. If pt calls back please inform him. If he doesn't call a letter needs to be sent certified to his home. Katharina Caper, [place] Kendra D, CMA d

## 2016-04-27 ENCOUNTER — Encounter: Payer: Self-pay | Admitting: Family Medicine

## 2016-04-27 DIAGNOSIS — N179 Acute kidney failure, unspecified: Secondary | ICD-10-CM

## 2016-05-02 ENCOUNTER — Ambulatory Visit: Payer: Medicare Other | Admitting: Family Medicine

## 2016-05-05 ENCOUNTER — Encounter: Payer: Self-pay | Admitting: Family Medicine

## 2016-05-05 ENCOUNTER — Ambulatory Visit (INDEPENDENT_AMBULATORY_CARE_PROVIDER_SITE_OTHER): Payer: Medicare Other | Admitting: Family Medicine

## 2016-05-05 VITALS — BP 115/96 | Ht 66.0 in | Wt 149.0 lb

## 2016-05-05 DIAGNOSIS — M25511 Pain in right shoulder: Secondary | ICD-10-CM | POA: Diagnosis not present

## 2016-05-05 DIAGNOSIS — M2540 Effusion, unspecified joint: Secondary | ICD-10-CM | POA: Insufficient documentation

## 2016-05-05 DIAGNOSIS — R938 Abnormal findings on diagnostic imaging of other specified body structures: Secondary | ICD-10-CM

## 2016-05-05 DIAGNOSIS — R9389 Abnormal findings on diagnostic imaging of other specified body structures: Secondary | ICD-10-CM

## 2016-05-05 MED ORDER — METHYLPREDNISOLONE ACETATE 40 MG/ML IJ SUSP
40.0000 mg | Freq: Once | INTRAMUSCULAR | Status: AC
  Administered 2016-05-05: 40 mg via INTRA_ARTICULAR

## 2016-05-05 NOTE — Patient Instructions (Signed)
Please get the X rays done at your convenience in the next week or so. Please make an appointment to see me back in about 4 weeks. I will send you a note about the X rays.

## 2016-05-05 NOTE — Telephone Encounter (Signed)
Placing letter to be mailed certified, not sure if already sent but in printing this noticed that the address we have listed for pt is different then the address on the letter so sending to the address we have in EPIC.  Katharina Caper, Logun Colavito D, [place]

## 2016-05-05 NOTE — Progress Notes (Signed)
[facility] Attending Note: I have seen and examined this patient. I have discussed this patient with the resident and reviewed the assessment and plan as documented above. I agree with the resident's findings and plan.  PERTINENT  PMH / PSH: I have reviewed the patient's medications, allergies, past medical and surgical history. Pertinent findings that relate to today's visit / issues include: Hx CVA Hx non ST MI HTN Hx cerebral thrombosis  IMAGING: CXR on 2015 was read as essentially normal.  1. Likely ganglion cyst associatyed with RIGHT AC joint. Under US guidance today we did aspiration and removed approximately 5 cc of viscous material. I also injected the area with steroid. Will have him come back in 4 weeks to see if it is resolved or recurring.  During the workup of his mass, I noted the last chest x-ray done in 2015 showed bilateral shoulder arthritis. There was also apparent upper lobe nodule that was not commented upon in the report. This looks like a granuloma by my reading but I am not a radiologist, so I think we need to get repeat CXR.Marland Kitchen He doesn't recall anyone telling him anything unusual about the x-ray there is no notation the chart , so I will repeat his chest x-ray to further evaluate this. We'll also get dedicated acromioclavicular joint x-rays.  INJECTION: Patient was given informed consent, signed copy in the chart. Appropriate time out was taken. Area prepped and draped in usual sterile fashion. Four cc of lidocaine 1% without epinephrine used for local anesthesia.   Under sterile conditions and using ultrasound guidance, the mass was aspirated with an 18 gauge needle on a 20 cc syringe. Approximately 5-7 cc of clear viscous (with slight late blood tinging of sample)  was aspirated. Then a 1 cc of 40 mg/ml depomedrol was injected into the collapsed cyst, and a bandaid applied.    The patient tolerated the procedure well. There were no complications. Post  procedure instructions were given.  Given the abnormal CXR and remote possibility this is a malignant process, I am sending the aspirate for cytology looking for any abnormal cells. I'm also ordering a chest x-ray and dedicated x-ray ofAC joints

## 2016-05-05 NOTE — Progress Notes (Signed)
    Subjective:  Norman Mendez is a 70 y.o. male who presents to the [facility] today with a chief complaint of right shoulder cyst.   HPI:  Right Shoulder Cyst Present for at least several months to a year. Patient is not sure exactly when it started. Mass is not painful. He is not sure if it is increasing in size. He has not tried any treatments. He does not have any pain in his right shoulder but has had mildly decreased ROM in that shoulder due to the mass.   ROS: Per HPI  Objective:  Physical Exam: BP (!) 115/96   Ht 5\' 6"  (1.676 m)   Wt 149 lb (67.6 kg)   BMI 24.05 kg/m   Gen: NAD, resting comfortably Pulm: NWOB MSK:  -Right Shoulder: Mobile, well circumscribed mass, approximately 3cm in diameter anterior to right AC joint.  Skin: warm, dry Neuro: grossly normal, moves all extremities Psych: Normal affect and thought content  Assessment/Plan:  No problem-specific Assessment & Plan notes found for this encounter.   Algis Greenhouse. Jerline Pain, [place] Resident PGY-3 05/05/2016 9:45 AM

## 2016-05-08 LAB — SYNOVIAL CELL COUNT + DIFF, W/ CRYSTALS
Basophils, %: 0 %
Eosinophils-Synovial: 0 % (ref 0–2)
Lymphocytes-Synovial Fld: 3 % (ref 0–74)
Monocyte/Macrophage: 97 % — ABNORMAL HIGH (ref 0–69)
Neutrophil, Synovial: 0 % (ref 0–24)
SYNOVIOCYTES, %: 0 % (ref 0–15)

## 2016-05-08 LAB — CYTOLOGY, FLUID (SOLSTAS)

## 2016-05-10 ENCOUNTER — Ambulatory Visit
Admission: RE | Admit: 2016-05-10 | Discharge: 2016-05-10 | Disposition: A | Discharge status: Home / Self Care | Payer: Medicare Other | Source: Ambulatory Visit | Attending: Family Medicine | Admitting: Family Medicine

## 2016-05-10 DIAGNOSIS — M25511 Pain in right shoulder: Secondary | ICD-10-CM

## 2016-05-10 DIAGNOSIS — R9389 Abnormal findings on diagnostic imaging of other specified body structures: Secondary | ICD-10-CM

## 2016-05-10 DIAGNOSIS — R079 Chest pain, unspecified: Secondary | ICD-10-CM | POA: Diagnosis not present

## 2016-05-19 ENCOUNTER — Encounter: Payer: Self-pay | Admitting: Family Medicine

## 2016-06-02 ENCOUNTER — Encounter: Payer: Self-pay | Admitting: Family Medicine

## 2016-06-02 ENCOUNTER — Ambulatory Visit (INDEPENDENT_AMBULATORY_CARE_PROVIDER_SITE_OTHER): Payer: Medicare Other | Admitting: Family Medicine

## 2016-06-02 DIAGNOSIS — R9389 Abnormal findings on diagnostic imaging of other specified body structures: Secondary | ICD-10-CM

## 2016-06-02 DIAGNOSIS — M19019 Primary osteoarthritis, unspecified shoulder: Secondary | ICD-10-CM

## 2016-06-02 DIAGNOSIS — R938 Abnormal findings on diagnostic imaging of other specified body structures: Secondary | ICD-10-CM

## 2016-06-02 NOTE — Progress Notes (Signed)
   Subjective:  Patient ID: Norman Mendez, male    DOB: May 10, 1946  Age: 70 y.o. MRN: [id number]  CC: Right Shoulder Cyst  HPI Norman Mendez is 70 year old male who presents today with right shoulder cyst. 5-7 cc of fluid was aspirated from the cyst at his last visit on 05/05/16. Patient reports that cyst has come back and feels that it has gone back to the same size. He says that the cyst  does not bother him. He denies any pain or tenderness from the cyst. He denies numbness or tingling. He also denies any weakness or change in his range of movement.    ROS Review of Systems  Musculoskeletal:       Right Shoulder Cyst  All other systems reviewed and are negative.   Objective:  BP (!) 144/78   Ht 5\' 5"  (1.651 m)   Wt 150 lb (68 kg)   BMI 24.96 kg/m    Physical Exam  Constitutional: He appears well-developed and well-nourished.  Musculoskeletal:  Right Shoulder: Cyst present on the anterior portion of shoulder. No tenderness. Normal ROM. Normal Strength.    Chest X-Ray CLINICAL DATA:  Right upper chest pain for the past year. History of a lung nodule on the right.  EXAM: CHEST  2 VIEW  COMPARISON:  Chest x-ray of March 17, 2014 and August 08, 2013 ; CT scan of the chest of November 16, 2011.  FINDINGS: The right lung is well-expanded and clear. 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 There are no pulmonary parenchymal nodules or masses. The heart and pulmonary vascularity are normal. There is calcification in the wall of the aortic arch. The bony thorax exhibits no acute abnormality. Stable prominence of the anterior aspect of the right first rib is again seen.  IMPRESSION: Chronic changes at the left lung base. No pulmonary parenchymal nodules or masses. No bulky lymphadenopathy.  Aortic atherosclerosis.   Electronically Signed   By: David  Martinique M.D.   On: 05/10/2016  09:47   Assessment & Plan:  1.Right Shoulder Cyst Cyst is about 1/2 to 1/3 the size it was when initially presented. Informed patient that to remove it completely we would most likely need to refer to surgeon. Patient elected to not refer to surgeon, as the cyst is not bothering him (no pain, tenderness, swelling). Told patient if cyst ever gets red and tender with accompanying fever to come back in. Patient agreed to do so.   2.Chest X-Ray of Lung Nodule X-ray is negative for lung nodule. Discussed x-ray results with patient and informed him that their are no lung nodule.   Follow-up: No Follow-up on file.   Salvadore Dom, Medical Student

## 2016-06-03 DIAGNOSIS — M19019 Primary osteoarthritis, unspecified shoulder: Secondary | ICD-10-CM | POA: Insufficient documentation

## 2016-06-03 DIAGNOSIS — R9389 Abnormal findings on diagnostic imaging of other specified body structures: Secondary | ICD-10-CM | POA: Insufficient documentation

## 2016-06-03 HISTORY — DX: Primary osteoarthritis, unspecified shoulder: M19.019

## 2016-11-29 ENCOUNTER — Ambulatory Visit (INDEPENDENT_AMBULATORY_CARE_PROVIDER_SITE_OTHER): Payer: Medicare Other | Admitting: Internal Medicine

## 2016-11-29 ENCOUNTER — Encounter: Payer: Self-pay | Admitting: Internal Medicine

## 2016-11-29 DIAGNOSIS — I10 Essential (primary) hypertension: Secondary | ICD-10-CM

## 2016-11-29 DIAGNOSIS — M5442 Lumbago with sciatica, left side: Secondary | ICD-10-CM | POA: Insufficient documentation

## 2016-11-29 MED ORDER — METOPROLOL TARTRATE 25 MG PO TABS: 25.0000 mg | ORAL_TABLET | Freq: Two times a day (BID) | ORAL | 0 refills | Status: DC

## 2016-11-29 MED ORDER — GABAPENTIN 100 MG PO CAPS: 100.0000 mg | ORAL_CAPSULE | Freq: Three times a day (TID) | ORAL | 3 refills | Status: DC | PRN

## 2016-11-29 MED ORDER — LISINOPRIL 10 MG PO TABS: 10.0000 mg | ORAL_TABLET | Freq: Every day | ORAL | 4 refills | Status: DC

## 2016-11-29 MED ORDER — LISINOPRIL 10 MG PO TABS: 10.0000 mg | ORAL_TABLET | Freq: Every day | ORAL | 0 refills | Status: DC

## 2016-11-29 MED ORDER — METOPROLOL TARTRATE 25 MG PO TABS: 25.0000 mg | ORAL_TABLET | Freq: Two times a day (BID) | ORAL | 4 refills | Status: DC

## 2016-11-29 NOTE — Progress Notes (Signed)
   [facility] Norman Mo, MD Phone: [phone]  Reason For Visit: SDA for Lower Back Pain    CHRONIC HTN: Current Meds - Metoprolol and lisinopril Reports being on of medications for two weeks, otherwise tolerating well, w/o complaints. Denies CP, dyspnea, HA, edema, dizziness / lightheadedness  # Lower Back Pain  - Has been hurting patient for several years off and on  - About two weeks ago back started hurting  - States he has pain in his lower back when he walks down to grocery store - the walk is about 1 hour for the patient  - Goes to grocery store about once a week  - Pain on left side  - Pain radiates down his leg   History of trauma or injury: No History of cancer: No Weak immune system:  No History of IV drug use: No History of steroid use: No  Symptoms Incontinence of bowel or bladder:  No Numbness of leg: Yes at times Fever: No Rest or Night pain: No Weight Loss:  No Rash: No   Past Medical History Reviewed problem list.  Medications- reviewed and updated No additions to family history Social history- patient is a non- smoker  Objective: BP (!) 152/82   Pulse 91   Temp 97.6 F (36.4 C) (Oral)   Wt 148 lb (67.1 kg)   BMI 24.63 kg/m  Gen: NAD, alert, cooperative with exam Cardio: regular rate and rhythm, S1S2 heard, no murmurs appreciated Pulm: clear to auscultation bilaterally, no wheezes, rhonchi or rales  MSK: Walks with cane, no tenderness along spinal processes or paraspinal muscles, no abnormalities noted on exam with visual inspection, normal range of motion, negative straight leg test, 5 out of 5 strength in lower extremities, L2- S1  intact, no changes in sensation, 1+ bilateral reflexes, 1+ DP pulses Neuro: Strength and sensation grossly intact   Assessment/Plan: See problem based a/p  Essential hypertension Elevated blood pressure today, has been out of blood pressure medication for 2 weeks - Will refill  lisinopril and Lopressor - Follow up with Dr. [name] in the next 1-2 months  Acute back pain with sciatica, left Sciatic back pain associated with walking especially when patient walks for 2 hours to and from the grocery store. States he does not have any transportation and the bus does not go in that direction. Benign history and exam  -Will provide when necessary gabapentin as needed for this pain, however patient would likely most benefit from -Could consider physical therapy in the future -Patient to try stretches before going on these long walks - Social work for possibly transportation needs -Follow up as needed

## 2016-11-29 NOTE — Patient Instructions (Signed)
I will see if social work has any thing to help with transportation. You can try gabapentin to help as needed with the back pain. I will refill your blood pressure medication. Please follow up with Dr. [name] in the next month or two for your blood pressure

## 2016-11-29 NOTE — Assessment & Plan Note (Signed)
Sciatic back pain associated with walking especially when patient walks for 2 hours to and from the grocery store. States he does not have any transportation and the bus does not go in that direction. Benign history and exam  -Will provide when necessary gabapentin as needed for this pain, however patient would likely most benefit from -Could consider physical therapy in the future -Patient to try stretches before going on these long walks - Social work for possibly transportation needs -Follow up as needed

## 2016-11-29 NOTE — Assessment & Plan Note (Signed)
Elevated blood pressure today, has been out of blood pressure medication for 2 weeks - Will refill lisinopril and Lopressor - Follow up with Dr. [name] in the next 1-2 months

## 2017-01-12 ENCOUNTER — Ambulatory Visit (INDEPENDENT_AMBULATORY_CARE_PROVIDER_SITE_OTHER): Payer: Medicare Other | Admitting: Family Medicine

## 2017-01-12 ENCOUNTER — Encounter: Payer: Self-pay | Admitting: Family Medicine

## 2017-01-12 VITALS — BP 142/90 | HR 81 | Temp 98.2°F | Ht 65.0 in | Wt 152.6 lb

## 2017-01-12 DIAGNOSIS — Z23 Encounter for immunization: Secondary | ICD-10-CM

## 2017-01-12 DIAGNOSIS — I10 Essential (primary) hypertension: Secondary | ICD-10-CM | POA: Diagnosis not present

## 2017-01-12 DIAGNOSIS — M25552 Pain in left hip: Secondary | ICD-10-CM

## 2017-01-12 NOTE — Progress Notes (Signed)
    Subjective: CC: HTN HPI: Norman Mendez is a 71 y.o. male presenting to clinic today for:  1. Hypertension Patient notes that he has been taking 1 "pink" pill once daily for BP.  He has not taken his meds this am.  He notes that he is unsure of what the name of the medication is.  ROS: Denies headache, dizziness, visual changes, nausea, vomiting, chest pain, abdominal pain or shortness of breath.  2. Left hip pain Patient reports that this has been ongoing for about 4 weeks.  He denies weakness, buckling, numbness, tingling.  Pain is described as aching and radiating to the left buttock.  He has not taken anything for pain except for Neurontin prn walks that was prescribed for him in April.  No bowel or bladder issues.  Specifically denies incontinence, frequency, abdominal pain, hematuria, dysuria, hesitancy.  Social Hx reviewed. MedHx, medications and allergies reviewed.  Please see EMR. Health Maintenance: Colonoscopy due ROS: Per HPI  Objective: Office vital signs reviewed. BP (!) 142/90   Pulse 81   Temp 98.2 F (36.8 C) (Oral)   Ht 5\' 5"  (1.651 m)   Wt 152 lb 9.6 oz (69.2 kg)   SpO2 98%   BMI 25.39 kg/m   Physical Examination:  General: Awake, alert, well nourished, No acute distress Cardio: regular rate and rhythm, S1S2 heard, no murmurs appreciated Pulm: clear to auscultation bilaterally, no wheezes, rhonchi or rales; normal work of breathing on room air Extremities: warm, well perfused, No edema, cyanosis or clubbing; +1 pulses bilaterally MSK: slow gait and normal station, negative straight leg raise, no midline TTP to spine, no paraspinal TTP; some weakness getting up from a seated position.  No point TTP to left greater troch.  +mild TTP to left gluteus. Skin: dry; intact; no rashes or lesions Neuro: 4/5 LE Strength bilaterally and light touch sensation grossly intact  Assessment/ Plan: 71 y.o. male   Essential hypertension Has not taken medication this am  and therefore no medication adjustments were made.  He is unsure as to which he has been taking.  I'd actually discontinued the Lisinopril in August after he had an elevation in his Cr.  He will bring all of his medications to RN appt for BP check next week.  I also repeated his BMP today to evaluate renal function.  If he has indeed been taking the ACE-I and renal function is stable, we may be able to continue it.  No red flags on exam.  Follow up next week for BP check w/ RN.  Left hip pain. ? Arthritic.  No focal findings on exam.  Specifically, no evidence of greater trochanteric bursitis.  He had a negative straight leg raise.  I question weakness in the hip muscles, as he has difficulty with getting up from a seated position without using the arm rests to push off from.  No midline tenderness or bony abnormalities to suggest fracture.  If persistent would consider xray of hips + referral to physical therapy for strengthening and balance exercises.  Need for vaccination with 13-polyvalent pneumococcal conjugate vaccine Pneumococcal conjugate vaccine 13-valent IM administered  Follow up in 2 weeks with me for BP management.  Janora Norlander, DO PGY-3, [facility]

## 2017-01-12 NOTE — Patient Instructions (Addendum)
-   I want you to come back next week for a blood pressure check with the nurse.  - Make sure to take your blood pressure medication before your nurse visit. - Bring all of the medications you are taking to that appointment.  Your goal blood pressure is BELOW 150/90.

## 2017-01-12 NOTE — Assessment & Plan Note (Addendum)
Has not taken medication this am and therefore no medication adjustments were made.  He is unsure as to which he has been taking.  I'd actually discontinued the Lisinopril in August after he had an elevation in his Cr.  He will bring all of his medications to RN appt for BP check next week.  I also repeated his BMP today to evaluate renal function.  If he has indeed been taking the ACE-I and renal function is stable, we may be able to continue it.  No red flags on exam.  Follow up next week for BP check w/ RN.

## 2017-01-13 LAB — BASIC METABOLIC PANEL
BUN/Creatinine Ratio: 7 — ABNORMAL LOW (ref 10–24)
BUN: 7 mg/dL — ABNORMAL LOW (ref 8–27)
CALCIUM: 9 mg/dL (ref 8.6–10.2)
CO2: 24 mmol/L (ref 18–29)
CREATININE: 1.06 mg/dL (ref 0.76–1.27)
Chloride: 99 mmol/L (ref 96–106)
GFR calc non Af Amer: 71 mL/min/{1.73_m2} (ref >59)
GFR, EST AFRICAN AMERICAN: 82 mL/min/{1.73_m2} (ref >59)
Glucose: 96 mg/dL (ref 65–99)
POTASSIUM: 3.9 mmol/L (ref 3.5–5.2)
Sodium: 140 mmol/L (ref 134–144)

## 2017-01-13 LAB — LIPID PANEL
CHOL/HDL RATIO: 4.7 ratio (ref 0.0–5.0)
Cholesterol, Total: 258 mg/dL — ABNORMAL HIGH (ref 100–199)
HDL: 55 mg/dL (ref >39)
LDL Calculated: 186 mg/dL — ABNORMAL HIGH (ref 0–99)
TRIGLYCERIDES: 87 mg/dL (ref 0–149)
VLDL CHOLESTEROL CAL: 17 mg/dL (ref 5–40)

## 2017-01-18 ENCOUNTER — Ambulatory Visit (INDEPENDENT_AMBULATORY_CARE_PROVIDER_SITE_OTHER): Payer: Medicare Other | Admitting: Podiatry

## 2017-01-18 ENCOUNTER — Encounter: Payer: Self-pay | Admitting: Podiatry

## 2017-01-18 VITALS — BP 142/82 | HR 64

## 2017-01-18 DIAGNOSIS — B351 Tinea unguium: Secondary | ICD-10-CM

## 2017-01-18 DIAGNOSIS — M79675 Pain in left toe(s): Secondary | ICD-10-CM | POA: Diagnosis not present

## 2017-01-18 DIAGNOSIS — M79674 Pain in right toe(s): Secondary | ICD-10-CM

## 2017-01-18 NOTE — Progress Notes (Signed)
   Subjective:    Patient ID: Norman Mendez, male    DOB: 10/20/1945, 71 y.o.   MRN: [id number]  HPI 71 year old male presents the office concerns of thick, painful, elongated toenails that he cannot trim himself. He states it has irritation with pressure in shoes. Denies any redness or drainage from the nail sites. He has no other concerns today.   Review of Systems  Skin: Positive for color change.       Objective:   Physical Exam General: AAO x3, NAD  Dermatological: Nails are hypertrophic, dystrophic, brittle, discolored, elongated 10. No surrounding redness or drainage. Tenderness nails 1-5 bilaterally. No open lesions or pre-ulcerative lesions are identified today.  Vascular: Dorsalis Pedis artery and Posterior Tibial artery pedal pulses are 1/4 bilateral with immedate capillary fill time. There is no pain with calf compression, swelling, warmth, erythema.   Neruologic: Sensation decreased with SWMF.   Musculoskeletal: No gross boney pedal deformities bilateral. No pain, crepitus, or limitation noted with foot and ankle range of motion bilateral. Muscular strength 5/5 in all groups tested bilateral.     Assessment & Plan:  71 year old male with symptomatic onychomycosis -Treatment options discussed including all alternatives, risks, and complications -Etiology of symptoms were discussed -Nails debrided 10 without complications or bleeding. -Daily foot inspection -Follow-up in 3 months or sooner if any problems arise. In the meantime, encouraged to call the office with any questions, concerns, change in symptoms.   Celesta Gentile, DPM

## 2017-01-19 ENCOUNTER — Ambulatory Visit (INDEPENDENT_AMBULATORY_CARE_PROVIDER_SITE_OTHER): Payer: Medicare Other | Admitting: Family Medicine

## 2017-01-19 ENCOUNTER — Encounter: Payer: Self-pay | Admitting: Family Medicine

## 2017-01-19 VITALS — BP 140/66 | HR 88 | Temp 96.6°F | Ht 65.0 in | Wt 147.2 lb

## 2017-01-19 DIAGNOSIS — I10 Essential (primary) hypertension: Secondary | ICD-10-CM | POA: Diagnosis present

## 2017-01-19 DIAGNOSIS — M5442 Lumbago with sciatica, left side: Secondary | ICD-10-CM

## 2017-01-19 DIAGNOSIS — R7309 Other abnormal glucose: Secondary | ICD-10-CM | POA: Diagnosis not present

## 2017-01-19 LAB — POCT GLYCOSYLATED HEMOGLOBIN (HGB A1C): HEMOGLOBIN A1C: 5.8

## 2017-01-19 MED ORDER — GABAPENTIN 100 MG PO CAPS: 100.0000 mg | ORAL_CAPSULE | Freq: Three times a day (TID) | ORAL | 3 refills | Status: DC | PRN

## 2017-01-19 NOTE — Progress Notes (Signed)
    Subjective: CC: HTN HPI: Norman Mendez is a 71 y.o. male presenting to clinic today for:  1. Hypertension Patient seen 5/18 for HTN.  His BP was 142/90 on that morning and he had not yet taken his antihypertensive.  He also was not sure which he was taking.  He had a BMP which revealed normal Cr 1.06.  He presents today for BP follow up and brings his medications to appt.  He brings in Lisinopril, Metoprolol, ASA 81, Lipitor.  He notes that he thought the Lisinopril was for his left hip pain.  He did not realize this was a BP medication.  ROS: Denies headache, dizziness, visual changes, nausea, vomiting, chest pain, abdominal pain or shortness of breath.  2. Left hip pain Patient reports that this has been ongoing for about 5-6 weeks.  Pain is described as aching and radiating to the left buttock.  Has not been taking Neurontin as previously thought.  He denies weakness, buckling, numbness, tingling.  Denies incontinence, frequency, abdominal pain, hematuria, dysuria, hesitancy.  Social Hx reviewed: non smoker. MedHx, medications and allergies reviewed.  Please see EMR. ROS: Per HPI  Objective: Office vital signs reviewed. BP 140/66   Pulse 88   Temp (!) 96.6 F (35.9 C) (Axillary)   Ht 5\' 5"  (1.651 m)   Wt 147 lb 3.2 oz (66.8 kg)   SpO2 96%   BMI 24.50 kg/m   Physical Examination:  General: Awake, alert, well nourished, No acute distress, sitting on exam table Cardio: regular rate and rhythm, S1S2 heard, no murmurs appreciated, no JVD Pulm: clear to auscultation bilaterally, no wheezes, rhonchi or rales; normal work of breathing on room air Extremities: warm, well perfused, No edema, cyanosis or clubbing; +1 pulses bilaterally MSK: slow gait and normal station, no midline TTP to spine, no paraspinal TTP; some weakness getting up from a seated position.  +mild TTP to left gluteus. Skin: dry; intact; no rashes or lesions Neuro: 4/5 LE Strength bilaterally and light touch  sensation grossly intact  Assessment/ Plan: 71 y.o. male   Essential hypertension BP controlled today.  I went over his medications with him and wrote down the indication for each of his medications.  I will put in a referral to [facility] today to see if we can perhaps make sure someone is helping him keep these straight.  BMP 5/18 revealed a normal Cr.  He has been taking Lisinopril without difficulty since his visit in 11/2016.  For now, continue Lisinopril, Metoprolol and Lipitor.  Follow up in 3 months.  Acute back pain with sciatica, left Never started Neurontin. This was resent to [employer].  I wrote down instructions for him.  If no improvement on medication in next couple weeks, patient will return and we will plan to get films of his hip/ lumbar spine.   Janora Norlander, DO PGY-3, [facility] Res[facility]

## 2017-01-19 NOTE — Assessment & Plan Note (Signed)
BP controlled today.  I went over his medications with him and wrote down the indication for each of his medications.  I will put in a referral to [facility] today to see if we can perhaps make sure someone is helping him keep these straight.  BMP 5/18 revealed a normal Cr.  He has been taking Lisinopril without difficulty since his visit in 11/2016.  For now, continue Lisinopril, Metoprolol and Lipitor.  Follow up in 3 months.

## 2017-01-19 NOTE — Patient Instructions (Signed)
I have sent in the Gabapentin to your pharmacy for your hip pain.  If this does not help in the next couple of weeks, come back and see me.

## 2017-01-19 NOTE — Assessment & Plan Note (Signed)
Never started Neurontin. This was resent to [employer].  I wrote down instructions for him.  If no improvement on medication in next couple weeks, patient will return and we will plan to get films of his hip/ lumbar spine.

## 2017-01-25 ENCOUNTER — Telehealth: Payer: Self-pay | Admitting: *Deleted

## 2017-01-26 NOTE — Telephone Encounter (Signed)
Spoke with Marcelino Scot of [place] [phone]) to relay referral. Unfortunately, patient has straight Medicaid and not CAII which means patient is ineligible for Care Management services with [facility] Dorothy states patient also has Medicare, however, we do not have a Deal card on file for patient. Patient may be eligible for services with [facility] or Home Health agency for med management if he does have Medicare. Hubbard Hartshorn, RN, BSN

## 2017-01-26 NOTE — Telephone Encounter (Signed)
LM on patient's VM requesting return call to discuss PCP referral to [facility] for med management. Hubbard Hartshorn, RN, BSN

## 2017-01-30 ENCOUNTER — Other Ambulatory Visit: Payer: Self-pay | Admitting: Family Medicine

## 2017-02-12 ENCOUNTER — Ambulatory Visit (INDEPENDENT_AMBULATORY_CARE_PROVIDER_SITE_OTHER): Payer: Medicare Other | Admitting: Student

## 2017-02-12 ENCOUNTER — Encounter: Payer: Self-pay | Admitting: Student

## 2017-02-12 DIAGNOSIS — M5442 Lumbago with sciatica, left side: Secondary | ICD-10-CM

## 2017-02-12 MED ORDER — GABAPENTIN 100 MG PO CAPS: 100.0000 mg | ORAL_CAPSULE | Freq: Three times a day (TID) | ORAL | 3 refills | Status: DC | PRN

## 2017-02-12 NOTE — Progress Notes (Signed)
   Subjective:    Patient ID: Norman Mendez, male    DOB: [birth year], 71 y.o.   MRN: [id number]   CC: left hip pain  HPI: 71 y/o M presents for left hip pain  Left hip pain - has been hurting for the last several months - he does not feel it is worsening just not improved - no recent injuries, falls, weakness or difficulty walking - he has been prescribed gabapentin for pain to take three tomes per day but has only been taking it once per day - reports some shooting pain down his left leg - denies back pain  Smoking status reviewed  Review of Systems  Per HPI, else denies recent illness, fever, chest pain, shortness of breath,   Objective:  BP 138/88   Pulse 62   Temp 98.3 F (36.8 C) (Oral)   Ht 5\' 5"  (1.651 m)   Wt 151 lb (68.5 kg)   SpO2 99%   BMI 25.13 kg/m  Vitals and nursing note reviewed  General: NAD Cardiac: RRR, Respiratory: CTAB, normal effort MSK: Mild left anterior iliac spine tenderness to palpation initially difficulty for the patient to find the tender area, 5/5 LE strength, neg straight leg raise bilaterally Extremities: no edema or cyanosis. WWP. Skin: warm and dry, no rashes noted Neuro: alert and oriented, no focal deficits   Assessment & Plan:    Acute back pain with sciatica, left Although the patient reports hip pain, his presentation is more consistent with his chronic back pain with sciatica. He is taking gabapentin only qD instead of TID - patient counseled to take gabapentin TID - follow with PCP     Shauna Bodkins A. Lincoln Brigham MD, [facility] Resident PGY-3 Pager [phone]

## 2017-02-12 NOTE — Assessment & Plan Note (Signed)
Although the patient reports hip pain, his presentation is more consistent with his chronic back pain with sciatica. He is taking gabapentin only qD instead of TID - patient counseled to take gabapentin TID - follow with PCP

## 2017-02-12 NOTE — Patient Instructions (Signed)
Follow up as needed Take Gabapentin three times per day Call the office with any questions or concerns

## 2017-02-12 NOTE — Telephone Encounter (Signed)
I think that this is reasonable, if patient is amenable.

## 2017-02-14 NOTE — Telephone Encounter (Signed)
Attempted to reach patient to discuss referral to [facility] for medication management. Line busy X 2. Will make second attempt later. Hubbard Hartshorn, RN, BSN

## 2017-02-15 NOTE — Telephone Encounter (Signed)
Second attempt to reach patient to discuss [facility] referral for med management. Line remains busy X 2.  Hubbard Hartshorn, RN, BSN

## 2017-02-16 NOTE — Telephone Encounter (Signed)
Third attempt to reach patient to discuss [facility] for med management. Line remains busy. Called Emergency Contact Priscille Loveless. No answer. Left message on VM requesting return call. Hubbard Hartshorn, RN, BSN

## 2017-02-16 NOTE — Telephone Encounter (Addendum)
The number on file for Emergency Contact, Priscille Loveless, now belongs to someone else. No alternate numbers on file. Hubbard Hartshorn, RN, BSN

## 2017-04-20 ENCOUNTER — Ambulatory Visit: Payer: Medicare Other | Admitting: Podiatry

## 2017-04-30 ENCOUNTER — Other Ambulatory Visit: Payer: Self-pay | Admitting: Family Medicine

## 2017-04-30 DIAGNOSIS — I10 Essential (primary) hypertension: Secondary | ICD-10-CM

## 2017-05-07 ENCOUNTER — Other Ambulatory Visit: Payer: Self-pay | Admitting: Family Medicine

## 2017-05-07 DIAGNOSIS — I10 Essential (primary) hypertension: Secondary | ICD-10-CM

## 2017-05-07 MED ORDER — LISINOPRIL 10 MG PO TABS: 10.0000 mg | ORAL_TABLET | Freq: Every day | ORAL | 0 refills | Status: DC

## 2017-05-07 NOTE — Telephone Encounter (Signed)
He is having trouble getting his BP meds filled at his pharmacy.  (He said his pharmacy is in chart but doesn't remember which one, he said it is on [address]),   Patient is here waiting for an answer about it since he has no phone.

## 2017-05-22 ENCOUNTER — Ambulatory Visit (INDEPENDENT_AMBULATORY_CARE_PROVIDER_SITE_OTHER): Payer: Medicare Other | Admitting: Family Medicine

## 2017-05-22 VITALS — BP 162/92 | HR 91 | Temp 97.6°F | Ht 65.0 in | Wt 151.2 lb

## 2017-05-22 DIAGNOSIS — N3945 Continuous leakage: Secondary | ICD-10-CM | POA: Diagnosis present

## 2017-05-22 MED ORDER — OXYBUTYNIN CHLORIDE 5 MG PO TABS: 5.0000 mg | ORAL_TABLET | Freq: Two times a day (BID) | ORAL | 0 refills | Status: DC

## 2017-05-22 NOTE — Patient Instructions (Signed)
It was great seeing you today! We have addressed the following issues today  1. I send a medication to your pharmacy to help with the leakage. 2. I made an appointment to see your regular doctor in two weeks to further discuss this issue. 3. Your blood pressure medication is ready for pick up.  If we did any lab work today, and the results require attention, either me or my nurse will get in touch with you. If everything is normal, you will get a letter in mail and a message via . If you don't hear from [place] in two weeks, please give [place] a call. Otherwise, we look forward to seeing you again at your next visit. If you have any questions or concerns before then, please call the clinic at [phone].  Please bring all your medications to every doctors visit  Sign up for My Chart to have easy access to your labs results, and communication with your Primary care physician. Please ask Front Desk for some assistance.   Please check-out at the front desk before leaving the clinic.    Take Care,   Dr. Andy Gauss

## 2017-05-22 NOTE — Progress Notes (Signed)
   Subjective:    Patient ID: Norman Mendez, male    DOB: 1945/12/27, 71 y.o.   MRN: [id number]   CC: Urine leakage  HPI: Patient is a 85-year-old male presenting today complaining of constant urinary leakage during the past year. Patient reports that he has constant urinary leakage and is currently wearing pads. Patient reports increased urinary frequency mostly at night. Patient states that he wakes up about 3-4 times at night. Patient denies any history of enlarge prostate. He was on oxybutynin, finasteride and Flomax in the past. Patient denies any blood in his urine or burning with urination. Patient is otherwise well denies any chest pain, abdominal pain, shortness of breath, nausea or vomiting.  Smoking status reviewed   ROS: all other systems were reviewed and are negative other than in the HPI   Past Medical History:  Diagnosis Date  . CVA (cerebral infarction)   . Hyperlipemia   . Hypertension   . NSTEMI (non-ST elevated myocardial infarction) ([place])   . Shortness of breath dyspnea     Past Surgical History:  Procedure Laterality Date  . LEFT HEART CATHETERIZATION WITH CORONARY ANGIOGRAM N/A 03/18/2014   Procedure: LEFT HEART CATHETERIZATION WITH CORONARY ANGIOGRAM;  Surgeon: Sinclair Grooms, MD;  Location: [facility] CATH LAB;  Service: Cardiovascular;  Laterality: N/A;  . No previous surgery      Objective:  BP (!) 162/92 (BP Location: Right Arm, Patient Position: Sitting, Cuff Size: Normal)   Pulse 91   Temp 97.6 F (36.4 C) (Oral)   Ht 5\' 5"  (1.651 m)   Wt 151 lb 3.2 oz (68.6 kg)   SpO2 95%   BMI 25.16 kg/m   Vitals and nursing note reviewed  General: NAD, pleasant, able to participate in exam Cardiac: RRR, normal heart sounds, no murmurs. 2+ radial and PT pulses bilaterally Respiratory: CTAB, normal effort, No wheezes, rales or rhonchi Abdomen: soft, nontender, nondistended, no hepatic or splenomegaly, +BS Extremities: no edema or cyanosis. WWP. Skin: warm and  dry, no rashes noted Neuro: alert and oriented x4, no focal deficits Psych: Normal affect and mood   Assessment & Plan:    #Urinary incontinence, chronic, unchanged. Patient is a 71 year old male who presented with urinary incontinence for the past year. Patient was on oxybutynin finasteride and Flomax in the past. Patient is a history of BPH, urinary retention, overactive bladder. Given symptoms of increased urinary frequency at night and age suspect BPH. However symptoms are more consistent with urinary continence secondary to possible bladder spasm. Likely they will also do no differential. --Will start patient on oxybutynin 5 mg twice a day titrated up as needed --Urinalysis to rule out urinary tract infection (unable to collect since patient had urinated his diaper at the time of collection) --Follow-up appointment was made with PCP in 2 weeks for follow-up to assess any improvement in symptoms with new treatment.  #Hypertension Patient hasn't taken his blood pressure medication in the past 2 weeks. Patient states that also told him that prescription was written for pickup. Looking in the patient's record blood pressure medication has been refill in July for 90 days. Recently refilled by PCP on 910 with some decreased by pharmacy due to patient having refill on previous prescription. Pharmacy was called and medications were released.    Marjie Skiff, MD [place] PGY-2

## 2017-05-23 ENCOUNTER — Ambulatory Visit: Payer: Medicare Other | Admitting: Student

## 2017-06-04 ENCOUNTER — Encounter: Payer: Self-pay | Admitting: Family Medicine

## 2017-06-04 ENCOUNTER — Ambulatory Visit (INDEPENDENT_AMBULATORY_CARE_PROVIDER_SITE_OTHER): Payer: Medicare Other | Admitting: Family Medicine

## 2017-06-04 ENCOUNTER — Ambulatory Visit: Payer: Medicare Other | Admitting: Family Medicine

## 2017-06-04 VITALS — BP 180/98 | HR 78 | Temp 97.6°F | Ht 65.0 in | Wt 143.2 lb

## 2017-06-04 DIAGNOSIS — I10 Essential (primary) hypertension: Secondary | ICD-10-CM | POA: Diagnosis not present

## 2017-06-04 DIAGNOSIS — N3945 Continuous leakage: Secondary | ICD-10-CM | POA: Diagnosis present

## 2017-06-04 MED ORDER — OXYBUTYNIN CHLORIDE 5 MG PO TABS: 2.5000 mg | ORAL_TABLET | Freq: Two times a day (BID) | ORAL | 0 refills | Status: DC

## 2017-06-04 MED ORDER — TAMSULOSIN HCL 0.4 MG PO CAPS: ORAL_CAPSULE | ORAL | 3 refills | Status: DC

## 2017-06-04 MED ORDER — TAMSULOSIN HCL 0.4 MG PO CAPS: 0.4000 mg | ORAL_CAPSULE | Freq: Three times a day (TID) | ORAL | 3 refills | Status: DC

## 2017-06-04 NOTE — Patient Instructions (Addendum)
Norman Mendez, you were seen today for persistent urinary incontinence. I want to start you back on a medication called flomax.  Take one pill after breakfast each day.   Please take decrease the oxybutynin to one half pill two times a day.   I have referred you to see a Urologist to have them try to help you with your urine problems.   Please come back on Wednesday for a blood pressure check and bring all of your medicines with you.   If you develop any CP, headaches with changes in vision or shortness of breath please call the office during regular hours, otherwise call 911 or go to the emergency department.   Victoriya Pol L. Rosalyn Gess, [facility] Resident PGY-2 06/04/2017 2:56 PM

## 2017-06-04 NOTE — Progress Notes (Signed)
    Subjective:  Norman Mendez is a 71 y.o. male who presents to the [facility] today for follow up for increased urinary frequency  HPI:  Urinary frequency: Patient was seen two weeks ago by Dr. Andy Mendez and at that time was noted increased frequency without dysuria, fevers, chills or back pain. Does have history of BPH, urinary retention and overactive bladder. Plan was to get a UA to r/o UTI, but patient had urinated in his diaper. Since his visit he endorses continued leakage, but not worsening of his symptoms. He denies any burning with urination, fevers, chills, nausea, vomiting or diarrhea.  He has been taking oxybutinin 5mg  BID and this has helped minimally with his symptoms. He denies any associated side effects with medication.   PMH: as above Tobacco use:  1 per day Medication: reviewed and updated ROS: see HPI   Objective:  Physical Exam: BP (!) 180/98   Pulse 78   Temp 97.6 F (36.4 C) (Oral)   Ht 5\' 5"  (1.651 m)   Wt 143 lb 3.2 oz (65 kg)   SpO2 99%   BMI 23.83 kg/m   Gen: 71 yo M in NAD, resting comfortably CV: RRR with no murmurs appreciated Pulm: NWOB, CTAB with no crackles, wheezes, or rhonchi GI: Normal bowel sounds present. Soft, Nontender, Nondistended. MSK: no edema, cyanosis, or clubbing noted Skin: warm, dry Neuro: grossly normal, moves all extremities Psych: Normal affect and thought content  No results found for this or any previous visit (from the past 72 hour(s)).   Assessment/Plan:  Urinary incontinence Patient reports ongoing urinary incontinence since his stroke last year. We attempted to collect UA at his last 2 visits, but unfortunately he had urinated in his diaper and was unable to provide urine sample. His condition is complicated by his history of BPH, urinary retention and overactive bladder. He was started on 5 mg oxybutynin twice a day and this helped minimally with his symptoms. Due to concern of adverse side effects in elderly population, I  have recommended that he decrease his dosage to 2.5 mg twice a day.  - Continue oxybutynin 2.5 mg twice a day - Urology referral placed   Essential hypertension Patient previously taking lisinopril and he was recommended to stop this last year due to elevated creatinine.  He reports that he is still taking this medication. Blood pressure today elevated to 180/98, without any headaches, changes in vision, chest pain or shortness of breath.  - Recommended follow-up in 2 days for blood pressure check - Asked patient to bring all his medications with him - Return precautions discussed  Norman Mendez, [facility] Resident PGY-2 06/06/2017 3:23 AM

## 2017-06-06 DIAGNOSIS — R32 Unspecified urinary incontinence: Secondary | ICD-10-CM | POA: Insufficient documentation

## 2017-06-06 NOTE — Assessment & Plan Note (Signed)
Patient reports ongoing urinary incontinence since his stroke last year. We attempted to collect UA at his last 2 visits, but unfortunately he had urinated in his diaper and was unable to provide urine sample. His condition is complicated by his history of BPH, urinary retention and overactive bladder. He was started on 5 mg oxybutynin twice a day and this helped minimally with his symptoms. Due to concern of adverse side effects in elderly population, I have recommended that he decrease his dosage to 2.5 mg twice a day.  - Continue oxybutynin 2.5 mg twice a day - Urology referral placed

## 2017-06-06 NOTE — Assessment & Plan Note (Addendum)
Patient previously taking lisinopril and he was recommended to stop this last year due to elevated creatinine.  He reports that he is still taking this medication. Blood pressure today elevated to 180/98, without any headaches, changes in vision, chest pain or shortness of breath.  - Recommended follow-up in 2 days for blood pressure check - Asked patient to bring all his medications with him - Return precautions discussed

## 2017-06-07 ENCOUNTER — Encounter: Payer: Self-pay | Admitting: Internal Medicine

## 2017-06-07 ENCOUNTER — Ambulatory Visit (INDEPENDENT_AMBULATORY_CARE_PROVIDER_SITE_OTHER): Payer: Medicare Other | Admitting: Internal Medicine

## 2017-06-07 VITALS — BP 140/92 | HR 111 | Temp 97.7°F | Ht 65.0 in | Wt 153.0 lb

## 2017-06-07 DIAGNOSIS — E785 Hyperlipidemia, unspecified: Secondary | ICD-10-CM | POA: Diagnosis present

## 2017-06-07 DIAGNOSIS — I10 Essential (primary) hypertension: Secondary | ICD-10-CM

## 2017-06-07 MED ORDER — METOPROLOL TARTRATE 25 MG PO TABS: 25.0000 mg | ORAL_TABLET | Freq: Two times a day (BID) | ORAL | 0 refills | Status: DC

## 2017-06-07 MED ORDER — ASPIRIN 81 MG PO CHEW: 81.0000 mg | CHEWABLE_TABLET | Freq: Every day | ORAL | Status: DC

## 2017-06-07 NOTE — Assessment & Plan Note (Signed)
Continue Atorvastatin  Restart aspirin

## 2017-06-07 NOTE — Assessment & Plan Note (Signed)
Blood pressure improved, however would keep goal less than 130/80 given hx of stroke  - Restarted lopressor and continue lisinopril

## 2017-06-07 NOTE — Patient Instructions (Signed)
I want you to restart taking your Lopressor and aspirin. Please follow-up in 2 weeks to have her blood pressure rechecked. Blood pressure is still elevated however may improve with restarting these medications

## 2017-06-07 NOTE — Progress Notes (Signed)
   [facility] [name], MD Phone: [phone]  Reason For Visit: Follow-up for blood pressure  # CHRONIC HTN: Currently patient is not taking Lopressor Current Meds - Lisinopril Reports good compliance, took meds today. Tolerating well, w/o complaints. Lifestyle - Stopped smoking in 2016 - smoked for more than 30 years.  Denies CP, dyspnea, HA, edema, dizziness / lightheadedness  #Hyperlipidemia Patient with history of stroke and smoking likely has CAD -Will restart patient's 81 mg of aspirin -Continue atorvastatin  Past Medical History Reviewed problem list.  Medications- reviewed and updated No additions to family history Social history- patient is a previous smoker   Objective: BP (!) 140/92   Pulse (!) 111   Temp 97.7 F (36.5 C) (Oral)   Ht 5\' 5"  (1.651 m)   Wt 153 lb (69.4 kg)   SpO2 97%   BMI 25.46 kg/m  Gen: NAD, alert, cooperative with exam Cardio: regular rate and rhythm, S1S2 heard, no murmurs appreciated Pulm: clear to auscultation bilaterally, no wheezes, rhonchi or rales Skin: dry, intact, no rashes or lesions   Assessment/Plan: See problem based a/p  Essential hypertension Blood pressure improved, however would keep goal less than 130/80 given hx of stroke  - Restarted lopressor and continue lisinopril    Hyperlipidemia Continue Atorvastatin  Restart aspirin

## 2017-06-07 NOTE — Progress Notes (Deleted)
   [facility] [name], MD Phone: [phone]  Reason For Visit:   # *** -   Past Medical History Reviewed problem list.  Medications- reviewed and updated No additions to family history Social history- patient is a *** smoker  Objective: BP (!) 140/92   Pulse (!) 111   Temp 97.7 F (36.5 C) (Oral)   Ht 5\' 5"  (1.651 m)   Wt 153 lb (69.4 kg)   SpO2 97%   BMI 25.46 kg/m  Gen: NAD, alert, cooperative with exam HEENT: Normal    Neck: No masses palpated. No lymphadenopathy    Ears: Tympanic membranes intact, normal light reflex, no erythema, no bulging    Eyes: PERRLA, EOMI    Nose: nasal turbinates moist    Throat: moist mucus membranes, no erythema Cardio: regular rate and rhythm, S1S2 heard, no murmurs appreciated Pulm: clear to auscultation bilaterally, no wheezes, rhonchi or rales GI: soft, non-tender, non-distended, bowel sounds present, no hepatomegaly, no splenomegaly GU: external vaginal tissue ***, cervix ***, *** punctate lesions on cervix appreciated, *** discharge from cervical os, *** bleeding, *** cervical motion tenderness, *** abdominal/ adnexal masses Extremities: warm, well perfused, No edema, cyanosis or clubbing;  MSK: Normal gait and station Skin: dry, intact, no rashes or lesions Neuro: Strength and sensation grossly intact   Assessment/Plan: See problem based a/p  No problem-specific Assessment & Plan notes found for this encounter.

## 2018-05-28 ENCOUNTER — Telehealth: Payer: Self-pay | Admitting: Family Medicine

## 2018-08-07 NOTE — Telephone Encounter (Signed)
error 

## 2018-09-17 ENCOUNTER — Ambulatory Visit (INDEPENDENT_AMBULATORY_CARE_PROVIDER_SITE_OTHER): Payer: Self-pay | Admitting: Family Medicine

## 2018-09-17 ENCOUNTER — Ambulatory Visit (HOSPITAL_COMMUNITY)
Admission: RE | Admit: 2018-09-17 | Discharge: 2018-09-17 | Disposition: A | Discharge status: Home / Self Care | Payer: Medicare Other | Source: Ambulatory Visit | Attending: Family Medicine | Admitting: Family Medicine

## 2018-09-17 ENCOUNTER — Other Ambulatory Visit: Payer: Self-pay

## 2018-09-17 ENCOUNTER — Encounter: Payer: Self-pay | Admitting: Family Medicine

## 2018-09-17 VITALS — BP 154/100 | HR 99 | Temp 97.9°F | Ht 65.0 in | Wt 156.4 lb

## 2018-09-17 DIAGNOSIS — E785 Hyperlipidemia, unspecified: Secondary | ICD-10-CM

## 2018-09-17 DIAGNOSIS — M19011 Primary osteoarthritis, right shoulder: Secondary | ICD-10-CM

## 2018-09-17 DIAGNOSIS — I10 Essential (primary) hypertension: Secondary | ICD-10-CM

## 2018-09-17 DIAGNOSIS — I491 Atrial premature depolarization: Secondary | ICD-10-CM

## 2018-09-17 DIAGNOSIS — I499 Cardiac arrhythmia, unspecified: Secondary | ICD-10-CM | POA: Diagnosis not present

## 2018-09-17 MED ORDER — ATORVASTATIN CALCIUM 80 MG PO TABS: 80.0000 mg | ORAL_TABLET | Freq: Every day | ORAL | 0 refills | Status: DC

## 2018-09-17 MED ORDER — METOPROLOL TARTRATE 25 MG PO TABS: 25.0000 mg | ORAL_TABLET | Freq: Two times a day (BID) | ORAL | 0 refills | Status: DC

## 2018-09-17 MED ORDER — ATORVASTATIN CALCIUM 80 MG PO TABS: 80.0000 mg | ORAL_TABLET | Freq: Every day | ORAL | 4 refills | Status: DC

## 2018-09-17 NOTE — Progress Notes (Signed)
Subjective  Norman Mendez is a 73 y.o. male is presenting with the following  HYPERTENSION Disease Monitoring  Home BP Monitoring (Severity) not checking Symptoms - Chest pain- no    Dyspnea- no Medications (Modifying factors) Compliance-  Out for many months. Lightheadedness-  no  Edema- no Timing - continuous  Duration - years ROS - See HPI  R SHOULDER KNOT and PAIN Has knot on R shoulder for a very long time.  Hurts him when he lays down at night.  Not during the day.  No weakness or recent trauma or redness or fever or other joint pains or rash.  Takes Aleve which helps a little    IRREGULAR HEART RATE Does not recall ever having before (but has cognitive impairment).  No chest pain or shortness of breath or lightheadness    PMH Lab Review   Potassium  Date Value Ref Range Status  01/12/2017 3.9 3.5 - 5.2 mmol/L Final   Sodium  Date Value Ref Range Status  01/12/2017 140 134 - 144 mmol/L Final   Creat  Date Value Ref Range Status  04/25/2016 1.49 (H) 0.70 - 1.25 mg/dL Final    Comment:      For patients > or = 72 years of age: The upper reference limit for Creatinine is approximately 13% higher for people identified as African-American.      Creatinine, Ser  Date Value Ref Range Status  01/12/2017 1.06 0.76 - 1.27 mg/dL Final      Chief Complaint noted Review of Symptoms - see HPI PMH - Smoking status noted.    Objective Vital Signs reviewed BP (!) 154/100   Pulse 99   Temp 97.9 F (36.6 C) (Axillary)   Ht 5\' 5"  (1.651 m)   Wt 156 lb 6.4 oz (70.9 kg)   SpO2 97%   BMI 26.03 kg/m  Alert cooperative Heart - freq skipped beats.  No murmur Lungs:  Normal respiratory effort, chest expands symmetrically. Lungs are clear to auscultation, no crackles or wheezes. Extremities:  No cyanosis, edema, or deformity in legs noted with good range of motion of all major joints.   R Shoulder - deformity with enlargement over AC joint.  Not tender to palpate or warm or  with crepitus.  FROM of shoulder without obvious pain,  Distal strength intact.  ECG - SR with freq PACs no afib   Prior shoulder xrays reviewed with patient and daughter   Assessments/Plans  See after visit summary for details of patient instuctions  Essential hypertension Not controlled due to not taking his medications.  Restarted metoprolol and check labs.  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  Will treat symptomatically.  Discussed pathology with daughter   Premature atrial beats Will check labs and restart his metoprolol

## 2018-09-17 NOTE — Assessment & Plan Note (Signed)
Not controlled due to not taking his medications.  Restarted metoprolol and check labs.  May need lisinopril if labs are ok

## 2018-09-17 NOTE — Patient Instructions (Addendum)
Good to see you today!  Thanks for coming in.  Shoulder - you have an old injury with separation of the joint now with arthritis in the shoulder - take Tylenol xtra strength 2 tabs before bed time - can rub Aspercreme on it when it hurts  Blood Pressure - we will check blood tests today. I will call you if your lab tests are not normal.  Otherwise we will discuss them at your next visit. - take the metoprolol medication that I sent in twice a day  - take the cholesterol medication Atorvastatin once a day  Irregular Heart beat - you should take the metoprolol twice a day which will help decrease this  - take a baby aspirin every day   Make an appointment to see your regular doctor in the next 2-3 weeks

## 2018-09-17 NOTE — Assessment & Plan Note (Signed)
Will check labs and restart his metoprolol

## 2018-09-17 NOTE — Assessment & Plan Note (Signed)
Persistent.  Will treat symptomatically.  Discussed pathology with daughter

## 2018-09-18 LAB — CBC
Hematocrit: 42.8 % (ref 37.5–51.0)
Hemoglobin: 13.8 g/dL (ref 13.0–17.7)
MCH: 28.2 pg (ref 26.6–33.0)
MCHC: 32.2 g/dL (ref 31.5–35.7)
MCV: 88 fL (ref 79–97)
Platelets: 272 10*3/uL (ref 150–450)
RBC: 4.89 x10E6/uL (ref 4.14–5.80)
RDW: 13.1 % (ref 11.6–15.4)
WBC: 7.1 10*3/uL (ref 3.4–10.8)

## 2018-09-18 LAB — CMP14+EGFR
A/G RATIO: 1.1 — AB (ref 1.2–2.2)
ALT: 31 IU/L (ref 0–44)
AST: 21 IU/L (ref 0–40)
Albumin: 4.1 g/dL (ref 3.7–4.7)
Alkaline Phosphatase: 113 IU/L (ref 39–117)
BUN/Creatinine Ratio: 8 — ABNORMAL LOW (ref 10–24)
BUN: 8 mg/dL (ref 8–27)
Bilirubin Total: 0.6 mg/dL (ref 0.0–1.2)
CO2: 24 mmol/L (ref 20–29)
Calcium: 9.6 mg/dL (ref 8.6–10.2)
Chloride: 106 mmol/L (ref 96–106)
Creatinine, Ser: 0.96 mg/dL (ref 0.76–1.27)
GFR calc Af Amer: 92 mL/min/{1.73_m2} (ref >59)
GFR calc non Af Amer: 79 mL/min/{1.73_m2} (ref >59)
Globulin, Total: 3.7 g/dL (ref 1.5–4.5)
Glucose: 114 mg/dL — ABNORMAL HIGH (ref 65–99)
Potassium: 4 mmol/L (ref 3.5–5.2)
SODIUM: 148 mmol/L — AB (ref 134–144)
Total Protein: 7.8 g/dL (ref 6.0–8.5)

## 2018-10-10 ENCOUNTER — Other Ambulatory Visit: Payer: Self-pay

## 2018-10-10 ENCOUNTER — Encounter: Payer: Self-pay | Admitting: Family Medicine

## 2018-10-10 ENCOUNTER — Ambulatory Visit (INDEPENDENT_AMBULATORY_CARE_PROVIDER_SITE_OTHER): Payer: Medicare Other | Admitting: Family Medicine

## 2018-10-10 VITALS — BP 130/70 | HR 78 | Temp 97.4°F | Wt 152.5 lb

## 2018-10-10 DIAGNOSIS — I10 Essential (primary) hypertension: Secondary | ICD-10-CM | POA: Diagnosis not present

## 2018-10-10 DIAGNOSIS — R7303 Prediabetes: Secondary | ICD-10-CM

## 2018-10-10 LAB — POCT GLYCOSYLATED HEMOGLOBIN (HGB A1C): Hemoglobin A1C: 5.7 % — AB (ref 4.0–5.6)

## 2018-10-10 MED ORDER — LISINOPRIL 5 MG PO TABS: 10.0000 mg | ORAL_TABLET | Freq: Every day | ORAL | 0 refills | Status: DC

## 2018-10-10 NOTE — Progress Notes (Signed)
   Subjective:   Patient ID: Norman Mendez    DOB: 1946/06/17, 73 y.o. male   MRN: [id number]  CC: f/u BP and HR   HPI: [name] is a 73 y.o. male who presents to clinic today for the following issue.  Essential hypertension Feels well overall since medication change at last visit.  Seen by Dr. Erin Hearing on 1/21 at [facility] and found to have elevated BP to 154/100 due to not taking his medications.  Metoprolol was restarted and labs obtained.   -Medications: Metoprolol 25 mg BID  -Reports good compliance with this  BP today improved to 130/70 and HR 78.  No CP or SOB, headache, dizziness or lightheadedness.   Irregular heart beat -Has been taking Metoprolol 25 mg BID in addition to baby aspirin daily  -No symptoms of heart racing, palpitations or dizziness.    ROS: See HPI for pertinent ROS.  Social: Patient is a former smoker, quit in 2015 Medications reviewed. Objective:   BP 130/70   Pulse 78   Temp (!) 97.4 F (36.3 C) (Oral)   Wt 152 lb 8 oz (69.2 kg)   SpO2 95%   BMI 25.38 kg/m  Vitals and nursing note reviewed.  General: 73 year old male, NAD HEENT: NCAT, EOMI, PERRL, MMM Neck: supple CV: RRR no MRG  Lungs: CTAB, normal effort  Abdomen: soft, NTND, +bs  Skin: warm, dry, no rash Extremities: warm and well perfused  Neuro: alert, oriented x3, no focal deficits   Assessment & Plan:   Essential hypertension Well controlled at [facility] today- BP is 130/70, recent labs ok with SCr 0.96 Add lisinopril 5 mg  Advised to continue Metoprolol 25 mg BID.  HR reassuring on exam.  Follow up in 1 month for recheck   Orders Placed This Encounter  Procedures  . HgB A1c   Meds ordered this encounter  Medications  . lisinopril (PRINIVIL,ZESTRIL) 5 MG tablet    Sig: Take 2 tablets (10 mg total) by mouth daily.    Dispense:  90 tablet    Refill:  0   Lovenia Kim, MD [facility] PGY-3

## 2018-10-10 NOTE — Patient Instructions (Signed)
It was nice meeting you today.  You were seen in clinic for follow-up of your high blood pressure and heart rate after restarting metoprolol.  Your blood pressure today was better, 130/70 and I would like you to continue taking metoprolol 25 mg twice daily.  I have also added a second blood pressure medication called lisinopril which you can pick up from your pharmacy.  In the meantime, check your blood pressures at home and log these to bring to your next visit.  You can follow-up in 1 month to recheck.  If you have any new or worsening symptoms, please call our clinic to make an appointment to be seen by a provider.

## 2018-10-11 ENCOUNTER — Encounter: Payer: Self-pay | Admitting: Family Medicine

## 2018-10-11 NOTE — Assessment & Plan Note (Addendum)
Well controlled at [facility] today- BP is 130/70, recent labs ok with SCr 0.96 Add lisinopril 5 mg  Advised to continue Metoprolol 25 mg BID.  HR reassuring on exam.  Follow up in 1 month for recheck

## 2018-11-22 ENCOUNTER — Ambulatory Visit: Payer: Medicare Other | Admitting: Family Medicine

## 2018-12-31 ENCOUNTER — Other Ambulatory Visit: Payer: Self-pay

## 2018-12-31 ENCOUNTER — Ambulatory Visit: Payer: Medicare Other | Admitting: Family Medicine

## 2018-12-31 ENCOUNTER — Ambulatory Visit (INDEPENDENT_AMBULATORY_CARE_PROVIDER_SITE_OTHER): Payer: Medicare Other | Admitting: Family Medicine

## 2018-12-31 VITALS — BP 138/90 | HR 74

## 2018-12-31 DIAGNOSIS — I10 Essential (primary) hypertension: Secondary | ICD-10-CM

## 2018-12-31 DIAGNOSIS — M19011 Primary osteoarthritis, right shoulder: Secondary | ICD-10-CM

## 2018-12-31 DIAGNOSIS — E785 Hyperlipidemia, unspecified: Secondary | ICD-10-CM | POA: Diagnosis not present

## 2018-12-31 MED ORDER — METOPROLOL TARTRATE 25 MG PO TABS: 25.0000 mg | ORAL_TABLET | Freq: Two times a day (BID) | ORAL | 3 refills | Status: DC

## 2018-12-31 MED ORDER — LISINOPRIL 10 MG PO TABS: 10.0000 mg | ORAL_TABLET | Freq: Every day | ORAL | 3 refills | Status: DC

## 2018-12-31 MED ORDER — ASPIRIN 81 MG PO CHEW: 81.0000 mg | CHEWABLE_TABLET | Freq: Every day | ORAL | 3 refills | Status: DC

## 2018-12-31 MED ORDER — ATORVASTATIN CALCIUM 80 MG PO TABS: 80.0000 mg | ORAL_TABLET | Freq: Every day | ORAL | 3 refills | Status: DC

## 2018-12-31 NOTE — Assessment & Plan Note (Signed)
Patient has had longstanding AC joint arthropathy.  His last x-ray in 2017 showed prominent spurring.  Advised over-the-counter ice, BenGay.  Recommended home exercises to increase range of motion.  Also offered steroid injection today, patient declined.  Patient asked for referral to orthopedic surgeon given the knot on his shoulder.  Referral placed today

## 2018-12-31 NOTE — Assessment & Plan Note (Signed)
Well-controlled.  Refills given today

## 2018-12-31 NOTE — Progress Notes (Signed)
Subjective:  Norman Mendez is a 73 y.o. male who presents to the [facility] today with a chief complaint of R shoulder pain.   HPI:  Patient is here with right shoulder pain.  It is longstanding and is been present intermittently for years.  It feels like an aching.  It hurts him most when he lays down at night.  He feels like his shoulder is gotten more stiff lately not the reason he is here today.  He is having trouble reaching over his head.  He has no weakness, numbness, tingling.  No recent trauma.  No fever. He recalls getting an injection remotely in the past that was helpful at the time.  But he is not interested today. He is not taking any over-the-counter medications.  Needs refills on his blood pressure and cholesterol medications. Does not check his blood pressure at home. Takes Lopressor 25 mg twice daily, lisinopril 10 mg daily, atorvastatin 80 mg daily, has not taken a baby aspirin for a long time. No lightheadedness, dizziness, chest pain, shortness of breath.     ROS: Per HPI  Social Hx: He reports that he quit smoking about 4 years ago. His smoking use included cigarettes. He smoked 0.20 packs per day. He has never used smokeless tobacco. He reports that he does not drink alcohol or use drugs.   Objective:  Physical Exam: BP 138/90   Pulse 74   SpO2 96%   Gen: NAD, resting comfortably Pulm: NWOB, chest expands symmetrically. MSK: Right shoulder with prominent deformity over AC joint.  Nontender to palpation.  No warmth or crepitus.  Right shoulder with full flexion and abduction.  Limited extension to 15 degrees.  Strength is 5 out of 5.  Sensation intact. Skin: warm, dry.  No rashes. Neuro: Alert, oriented, moves all extremities.  Speech is slow but coherent not slurring Psych: Normal affect and thought content   Assessment/Plan:  Acromioclavicular joint arthropathy and ganglion cyst Patient has had longstanding AC joint arthropathy.  His last x-ray in 2017  showed prominent spurring.  Advised over-the-counter ice, BenGay.  Recommended home exercises to increase range of motion.  Also offered steroid injection today, patient declined.  Patient asked for referral to orthopedic surgeon given the knot on his shoulder.  Referral placed today  Essential hypertension Well-controlled.  Refills given today  Hyperlipidemia Refilled atorvastatin.  Counseled patient on restarting baby aspirin.  Refills given today    Orders Placed This Encounter  Procedures  . Ambulatory referral to Orthopedic Surgery    Referral Priority:   Routine    Referral Type:   Surgical    Referral Reason:   Specialty Services Required    Requested Specialty:   Orthopedic Surgery    Number of Visits Requested:   1    Meds ordered this encounter  Medications  . lisinopril (ZESTRIL) 10 MG tablet    Sig: Take 1 tablet (10 mg total) by mouth daily.    Dispense:  90 tablet    Refill:  3  . metoprolol tartrate (LOPRESSOR) 25 MG tablet    Sig: Take 1 tablet (25 mg total) by mouth 2 (two) times daily.    Dispense:  180 tablet    Refill:  3  . atorvastatin (LIPITOR) 80 MG tablet    Sig: Take 1 tablet (80 mg total) by mouth daily at 6 PM.    Dispense:  90 tablet    Refill:  3  . aspirin 81 MG chewable tablet  Sig: Chew 1 tablet (81 mg total) by mouth daily.    Dispense:  90 tablet    Refill:  [place], DO PGY-3, [facility] 12/31/2018 9:36 AM

## 2018-12-31 NOTE — Patient Instructions (Signed)
Take over the counter tylenol. Use Ice or ben-gay cream.    Shoulder Exercises Ask your health care provider which exercises are safe for you. Do exercises exactly as told by your health care provider and adjust them as directed. It is normal to feel mild stretching, pulling, tightness, or discomfort as you do these exercises, but you should stop right away if you feel sudden pain or your pain gets worse.Do not begin these exercises until told by your health care provider. Range of Motion Exercises        These exercises warm up your muscles and joints and improve the movement and flexibility of your shoulder. These exercises also help to relieve pain, numbness, and tingling. These exercises involve stretching your injured shoulder directly. Exercise A: Pendulum 1. Stand near a wall or a surface that you can hold onto for balance. 2. Bend at the waist and let your left / right arm hang straight down. Use your other arm to support you. Keep your back straight and do not lock your knees. 3. Relax your left / right arm and shoulder muscles, and move your hips and your trunk so your left / right arm swings freely. Your arm should swing because of the motion of your body, not because you are using your arm or shoulder muscles. 4. Keep moving your body so your arm swings in the following directions, as told by your health care provider: ? Side to side. ? Forward and backward. ? In clockwise and counterclockwise circles. 5. Continue each motion for __________ seconds, or for as long as told by your health care provider. 6. Slowly return to the starting position. Repeat __________ times. Complete this exercise __________ times a day. Exercise B:Flexion, Standing 1. Stand and hold a broomstick, a cane, or a similar object. Place your hands a little more than shoulder-width apart on the object. Your left / right hand should be palm-up, and your other hand should be palm-down. 2. Keep your elbow  straight and keep your shoulder muscles relaxed. Push the stick down with your healthy arm to raise your left / right arm in front of your body, and then over your head until you feel a stretch in your shoulder. ? Avoid shrugging your shoulder while you raise your arm. Keep your shoulder blade tucked down toward the middle of your back. 3. Hold for __________ seconds. 4. Slowly return to the starting position. Repeat __________ times. Complete this exercise __________ times a day. Exercise C: Abduction, Standing 1. Stand and hold a broomstick, a cane, or a similar object. Place your hands a little more than shoulder-width apart on the object. Your left / right hand should be palm-up, and your other hand should be palm-down. 2. While keeping your elbow straight and your shoulder muscles relaxed, push the stick across your body toward your left / right side. Raise your left / right arm to the side of your body and then over your head until you feel a stretch in your shoulder. ? Do not raise your arm above shoulder height, unless your health care provider tells you to do that. ? Avoid shrugging your shoulder while you raise your arm. Keep your shoulder blade tucked down toward the middle of your back. 3. Hold for __________ seconds. 4. Slowly return to the starting position. Repeat __________ times. Complete this exercise __________ times a day. Exercise D:Internal Rotation 1. Place your left / right hand behind your back, palm-up. 2. Use your other hand to  dangle an exercise band, a towel, or a similar object over your shoulder. Grasp the band with your left / right hand so you are holding onto both ends. 3. Gently pull up on the band until you feel a stretch in the front of your left / right shoulder. ? Avoid shrugging your shoulder while you raise your arm. Keep your shoulder blade tucked down toward the middle of your back. 4. Hold for __________ seconds. 5. Release the stretch by letting go of  the band and lowering your hands. Repeat __________ times. Complete this exercise __________ times a day. Stretching Exercises  These exercises warm up your muscles and joints and improve the movement and flexibility of your shoulder. These exercises also help to relieve pain, numbness, and tingling. These exercises are done using your healthy shoulder to help stretch the muscles of your injured shoulder. Exercise E: Warehouse manager (External Rotation and Abduction) 1. Stand in a doorway with one of your feet slightly in front of the other. This is called a staggered stance. If you cannot reach your forearms to the door frame, stand facing a corner of a room. 2. Choose one of the following positions as told by your health care provider: ? Place your hands and forearms on the door frame above your head. ? Place your hands and forearms on the door frame at the height of your head. ? Place your hands on the door frame at the height of your elbows. 3. Slowly move your weight onto your front foot until you feel a stretch across your chest and in the front of your shoulders. Keep your head and chest upright and keep your abdominal muscles tight. 4. Hold for __________ seconds. 5. To release the stretch, shift your weight to your back foot. Repeat __________ times. Complete this stretch __________ times a day. Exercise F:Extension, Standing 1. Stand and hold a broomstick, a cane, or a similar object behind your back. ? Your hands should be a little wider than shoulder-width apart. ? Your palms should face away from your back. 2. Keeping your elbows straight and keeping your shoulder muscles relaxed, move the stick away from your body until you feel a stretch in your shoulder. ? Avoid shrugging your shoulders while you move the stick. Keep your shoulder blade tucked down toward the middle of your back. 3. Hold for __________ seconds. 4. Slowly return to the starting position. Repeat __________ times.  Complete this exercise __________ times a day. Strengthening Exercises           These exercises build strength and endurance in your shoulder. Endurance is the ability to use your muscles for a long time, even after they get tired. Exercise G:External Rotation 1. Sit in a stable chair without armrests. 2. Secure an exercise band at elbow height on your left / right side. 3. Place a soft object, such as a folded towel or a small pillow, between your left / right upper arm and your body to move your elbow a few inches away (about 10 cm) from your side. 4. Hold the end of the band so it is tight and there is no slack. 5. Keeping your elbow pressed against the soft object, move your left / right forearm out, away from your abdomen. Keep your body steady so only your forearm moves. 6. Hold for __________ seconds. 7. Slowly return to the starting position. Repeat __________ times. Complete this exercise __________ times a day. Exercise H:Shoulder Abduction 1. Sit in a  stable chair without armrests, or stand. 2. Hold a __________ weight in your left / right hand, or hold an exercise band with both hands. 3. Start with your arms straight down and your left / right palm facing in, toward your body. 4. Slowly lift your left / right hand out to your side. Do not lift your hand above shoulder height unless your health care provider tells you that this is safe. ? Keep your arms straight. ? Avoid shrugging your shoulder while you do this movement. Keep your shoulder blade tucked down toward the middle of your back. 5. Hold for __________ seconds. 6. Slowly lower your arm, and return to the starting position. Repeat __________ times. Complete this exercise __________ times a day. Exercise I:Shoulder Extension 1. Sit in a stable chair without armrests, or stand. 2. Secure an exercise band to a stable object in front of you where it is at shoulder height. 3. Hold one end of the exercise band in  each hand. Your palms should face each other. 4. Straighten your elbows and lift your hands up to shoulder height. 5. Step back, away from the secured end of the exercise band, until the band is tight and there is no slack. 6. Squeeze your shoulder blades together as you pull your hands down to the sides of your thighs. Stop when your hands are straight down by your sides. Do not let your hands go behind your body. 7. Hold for __________ seconds. 8. Slowly return to the starting position. Repeat __________ times. Complete this exercise __________ times a day. Exercise J:Standing Shoulder Row 1. Sit in a stable chair without armrests, or stand. 2. Secure an exercise band to a stable object in front of you so it is at waist height. 3. Hold one end of the exercise band in each hand. Your palms should be in a thumbs-up position. 4. Bend each of your elbows to an "L" shape (about 90 degrees) and keep your upper arms at your sides. 5. Step back until the band is tight and there is no slack. 6. Slowly pull your elbows back behind you. 7. Hold for __________ seconds. 8. Slowly return to the starting position. Repeat __________ times. Complete this exercise __________ times a day. Exercise K:Shoulder Press-Ups 1. Sit in a stable chair that has armrests. Sit upright, with your feet flat on the floor. 2. Put your hands on the armrests so your elbows are bent and your fingers are pointing forward. Your hands should be about even with the sides of your body. 3. Push down on the armrests and use your arms to lift yourself off of the chair. Straighten your elbows and lift yourself up as much as you comfortably can. ? Move your shoulder blades down, and avoid letting your shoulders move up toward your ears. ? Keep your feet on the ground. As you get stronger, your feet should support less of your body weight as you lift yourself up. 4. Hold for __________ seconds. 5. Slowly lower yourself back into the  chair. Repeat __________ times. Complete this exercise __________ times a day. Exercise L: Wall Push-Ups 1. Stand so you are facing a stable wall. Your feet should be about one arm-length away from the wall. 2. Lean forward and place your palms on the wall at shoulder height. 3. Keep your feet flat on the floor as you bend your elbows and lean forward toward the wall. 4. Hold for __________ seconds. 5. Straighten your elbows to push yourself  back to the starting position. Repeat __________ times. Complete this exercise __________ times a day. This information is not intended to replace advice given to you by your health care provider. Make sure you discuss any questions you have with your health care provider. Document Released: 06/28/2005 Document Revised: 12/18/2017 Document Reviewed: 04/25/2015 Elsevier Interactive Patient Education  2019 [employer].

## 2018-12-31 NOTE — Assessment & Plan Note (Signed)
Refilled atorvastatin.  Counseled patient on restarting baby aspirin.  Refills given today

## 2019-02-14 ENCOUNTER — Encounter: Payer: Self-pay | Admitting: Family Medicine

## 2019-02-14 ENCOUNTER — Telehealth: Payer: Self-pay

## 2019-02-14 ENCOUNTER — Ambulatory Visit (INDEPENDENT_AMBULATORY_CARE_PROVIDER_SITE_OTHER): Payer: Medicare Other | Admitting: Family Medicine

## 2019-02-14 ENCOUNTER — Other Ambulatory Visit: Payer: Self-pay

## 2019-02-14 VITALS — BP 170/90 | HR 73 | Wt 146.0 lb

## 2019-02-14 DIAGNOSIS — W19XXXA Unspecified fall, initial encounter: Secondary | ICD-10-CM

## 2019-02-14 DIAGNOSIS — Z043 Encounter for examination and observation following other accident: Secondary | ICD-10-CM

## 2019-02-14 DIAGNOSIS — Z7409 Other reduced mobility: Secondary | ICD-10-CM

## 2019-02-14 DIAGNOSIS — Y92009 Unspecified place in unspecified non-institutional (private) residence as the place of occurrence of the external cause: Secondary | ICD-10-CM

## 2019-02-14 NOTE — Telephone Encounter (Signed)
Rolling walker DME faxed to [facility].

## 2019-02-14 NOTE — Progress Notes (Addendum)
   Subjective:   Patient ID: Norman Mendez    DOB: [birth year], 73 y.o. male   MRN: [id number]  CC: would like to discuss getting a walker   HPI: [name] is a 73 y.o. male who presents to clinic today for the following issue.  Assistive device  He reports a fall on Monday at home.  He did not hit his head or lose consciousness.  He states the fall was mechanical and he tripped over an object. Denies any other injury from the incident.  He currently ambulates with a cane and would feel more stable with a walker.  He lives alone and has a daughter nearby who checks in on him.  He denies dizziness, lightheadedness or ringing in his ears. He is independent with his ADLs and is able to cook, feed himself, bathe and groom.  He manages his own finances and denies any concerns with his memory.    ROS: See HPI for pertinent ROS.  Social: pt is a former smoker, quit 2019.  Medications reviewed. Objective:   BP (!) 170/90   Pulse 73   Wt 146 lb (66.2 kg)   SpO2 98%   BMI 24.30 kg/m  Vitals and nursing note reviewed.  General: 73 yo male, NAD HEENT: NCAT, EOMI, PERRL, MMM Neck: supple CV: RRR no MRG  Lungs: CTAB, normal effort  Abdomen: soft, NTND, +bs  Skin: warm, dry Extremities: warm and well perfused, normal tone Neuro: alert, oriented x3, no focal deficits   Assessment & Plan:   Fall at home Mechanical fall at home while using a cane to ambulate. No h/o hitting head or LOC to warrant imaging studies and he appears at his baseline without injury.  He requests a walker for more stability while ambulating.   Medication list reviewed, he is taking oxybutynin.  Discussed with him that a medication with anticholingeric activity could also potentially lead to more falls.  He denies frequent falls, this was an isolated event. Would like to continue this medication as he has seen good results with it.   -Fall precautions and safety reviewed  -DME Rx for walker faxed  Could consider  PT referral for home safety measures if needed   Orders Placed This Encounter  Procedures  . For home use only DME 4 wheeled rolling walker with seat    Order Specific Question:   Patient needs a walker to treat with the following condition    Answer:   Fall at home [103159]   Lovenia Kim, MD [place] PGY-3 02/14/2019 9:17 AM

## 2019-02-14 NOTE — Patient Instructions (Signed)
Fall Prevention in the Home, Adult  Falls can cause injuries. They can happen to people of all ages. There are many things you can do to make your home safe and to help prevent falls. Ask for help when making these changes, if needed.  What actions can I take to prevent falls?  General Instructions  · Use good lighting in all rooms. Replace any light bulbs that burn out.  · Turn on the lights when you go into a dark area. Use night-lights.  · Keep items that you use often in easy-to-reach places. Lower the shelves around your home if necessary.  · Set up your furniture so you have a clear path. Avoid moving your furniture around.  · Do not have throw rugs and other things on the floor that can make you trip.  · Avoid walking on wet floors.  · If any of your floors are uneven, fix them.  · Add color or contrast paint or tape to clearly mark and help you see:  ? Any grab bars or handrails.  ? First and last steps of stairways.  ? Where the edge of each step is.  · If you use a stepladder:  ? Make sure that it is fully opened. Do not climb a closed stepladder.  ? Make sure that both sides of the stepladder are locked into place.  ? Ask someone to hold the stepladder for you while you use it.  · If there are any pets around you, be aware of where they are.  What can I do in the bathroom?         · Keep the floor dry. Clean up any water that spills onto the floor as soon as it happens.  · Remove soap buildup in the tub or shower regularly.  · Use non-skid mats or decals on the floor of the tub or shower.  · Attach bath mats securely with double-sided, non-slip rug tape.  · If you need to sit down in the shower, use a plastic, non-slip stool.  · Install grab bars by the toilet and in the tub and shower. Do not use towel bars as grab bars.  What can I do in the bedroom?  · Make sure that you have a light by your bed that is easy to reach.  · Do not use any sheets or blankets that are too big for your bed. They should  not hang down onto the floor.  · Have a firm chair that has side arms. You can use this for support while you get dressed.  What can I do in the kitchen?  · Clean up any spills right away.  · If you need to reach something above you, use a strong step stool that has a grab bar.  · Keep electrical cords out of the way.  · Do not use floor polish or wax that makes floors slippery. If you must use wax, use non-skid floor wax.  What can I do with my stairs?  · Do not leave any items on the stairs.  · Make sure that you have a light switch at the top of the stairs and the bottom of the stairs. If you do not have them, ask someone to add them for you.  · Make sure that there are handrails on both sides of the stairs, and use them. Fix handrails that are broken or loose. Make sure that handrails are as long as the stairways.  ·   Install non-slip stair treads on all stairs in your home.  · Avoid having throw rugs at the top or bottom of the stairs. If you do have throw rugs, attach them to the floor with carpet tape.  · Choose a carpet that does not hide the edge of the steps on the stairway.  · Check any carpeting to make sure that it is firmly attached to the stairs. Fix any carpet that is loose or worn.  What can I do on the outside of my home?  · Use bright outdoor lighting.  · Regularly fix the edges of walkways and driveways and fix any cracks.  · Remove anything that might make you trip as you walk through a door, such as a raised step or threshold.  · Trim any bushes or trees on the path to your home.  · Regularly check to see if handrails are loose or broken. Make sure that both sides of any steps have handrails.  · Install guardrails along the edges of any raised decks and porches.  · Clear walking paths of anything that might make someone trip, such as tools or rocks.  · Have any leaves, snow, or ice cleared regularly.  · Use sand or salt on walking paths during winter.  · Clean up any spills in your garage right  away. This includes grease or oil spills.  What other actions can I take?  · Wear shoes that:  ? Have a low heel. Do not wear high heels.  ? Have rubber bottoms.  ? Are comfortable and fit you well.  ? Are closed at the toe. Do not wear open-toe sandals.  · Use tools that help you move around (mobility aids) if they are needed. These include:  ? Canes.  ? Walkers.  ? Scooters.  ? Crutches.  · Review your medicines with your doctor. Some medicines can make you feel dizzy. This can increase your chance of falling.  Ask your doctor what other things you can do to help prevent falls.  Where to find more information  · Centers for Disease Control and Prevention, STEADI: https://cdc.gov  · National Institute on Aging: https://go4life.nia.nih.gov  Contact a doctor if:  · You are afraid of falling at home.  · You feel weak, drowsy, or dizzy at home.  · You fall at home.  Summary  · There are many simple things that you can do to make your home safe and to help prevent falls.  · Ways to make your home safe include removing tripping hazards and installing grab bars in the bathroom.  · Ask for help when making these changes in your home.  This information is not intended to replace advice given to you by your health care provider. Make sure you discuss any questions you have with your health care provider.  Document Released: 06/10/2009 Document Revised: 03/29/2017 Document Reviewed: 03/29/2017  Elsevier Interactive Patient Education © 2019 [employer]

## 2019-02-14 NOTE — Assessment & Plan Note (Addendum)
Mechanical fall at home while using a cane to ambulate. No h/o hitting head or LOC to warrant imaging studies and he appears at his baseline without injury.  He requests a walker for more stability while ambulating.   Medication list reviewed, he is taking oxybutynin.  Discussed with him that a medication with anticholingeric activity could also potentially lead to more falls.  He denies frequent falls, this was an isolated event. Would like to continue this medication as he has seen good results with it.   -Fall precautions and safety reviewed  -DME Rx for walker faxed  Could consider PT referral for home safety measures if needed

## 2019-06-18 ENCOUNTER — Other Ambulatory Visit: Payer: Self-pay

## 2019-06-18 DIAGNOSIS — I10 Essential (primary) hypertension: Secondary | ICD-10-CM

## 2019-06-19 MED ORDER — METOPROLOL TARTRATE 25 MG PO TABS: 25.0000 mg | ORAL_TABLET | Freq: Two times a day (BID) | ORAL | 1 refills | Status: DC

## 2019-08-27 ENCOUNTER — Inpatient Hospital Stay (HOSPITAL_COMMUNITY)
Admission: EM | Admit: 2019-08-27 | Discharge: 2019-08-29 | DRG: 690 | Disposition: A | Discharge status: Home / Self Care | Payer: Medicare Other | Attending: Family Medicine | Admitting: Family Medicine

## 2019-08-27 ENCOUNTER — Emergency Department (HOSPITAL_COMMUNITY): Payer: Medicare Other

## 2019-08-27 ENCOUNTER — Encounter (HOSPITAL_COMMUNITY): Payer: Self-pay | Admitting: Emergency Medicine

## 2019-08-27 ENCOUNTER — Other Ambulatory Visit: Payer: Self-pay

## 2019-08-27 DIAGNOSIS — I10 Essential (primary) hypertension: Secondary | ICD-10-CM | POA: Diagnosis present

## 2019-08-27 DIAGNOSIS — N390 Urinary tract infection, site not specified: Secondary | ICD-10-CM | POA: Diagnosis not present

## 2019-08-27 DIAGNOSIS — R591 Generalized enlarged lymph nodes: Secondary | ICD-10-CM

## 2019-08-27 DIAGNOSIS — Z7982 Long term (current) use of aspirin: Secondary | ICD-10-CM

## 2019-08-27 DIAGNOSIS — K5909 Other constipation: Secondary | ICD-10-CM | POA: Diagnosis present

## 2019-08-27 DIAGNOSIS — Z87891 Personal history of nicotine dependence: Secondary | ICD-10-CM

## 2019-08-27 DIAGNOSIS — R112 Nausea with vomiting, unspecified: Secondary | ICD-10-CM | POA: Diagnosis present

## 2019-08-27 DIAGNOSIS — E785 Hyperlipidemia, unspecified: Secondary | ICD-10-CM | POA: Diagnosis present

## 2019-08-27 DIAGNOSIS — Z8673 Personal history of transient ischemic attack (TIA), and cerebral infarction without residual deficits: Secondary | ICD-10-CM

## 2019-08-27 DIAGNOSIS — R32 Unspecified urinary incontinence: Secondary | ICD-10-CM | POA: Diagnosis present

## 2019-08-27 DIAGNOSIS — Z23 Encounter for immunization: Secondary | ICD-10-CM

## 2019-08-27 DIAGNOSIS — R590 Localized enlarged lymph nodes: Secondary | ICD-10-CM | POA: Diagnosis present

## 2019-08-27 DIAGNOSIS — Z20822 Contact with and (suspected) exposure to covid-19: Secondary | ICD-10-CM | POA: Diagnosis present

## 2019-08-27 DIAGNOSIS — R739 Hyperglycemia, unspecified: Secondary | ICD-10-CM | POA: Diagnosis present

## 2019-08-27 DIAGNOSIS — I252 Old myocardial infarction: Secondary | ICD-10-CM

## 2019-08-27 HISTORY — DX: Urinary tract infection, site not specified: N39.0

## 2019-08-27 LAB — COMPREHENSIVE METABOLIC PANEL
ALT: 22 U/L (ref 0–44)
AST: 20 U/L (ref 15–41)
Albumin: 3.5 g/dL (ref 3.5–5.0)
Alkaline Phosphatase: 97 U/L (ref 38–126)
Anion gap: 14 (ref 5–15)
BUN: 13 mg/dL (ref 8–23)
CO2: 22 mmol/L (ref 22–32)
Calcium: 9.4 mg/dL (ref 8.9–10.3)
Chloride: 101 mmol/L (ref 98–111)
Creatinine, Ser: 1.21 mg/dL (ref 0.61–1.24)
GFR calc Af Amer: >60 mL/min (ref >60)
GFR calc non Af Amer: 59 mL/min — ABNORMAL LOW (ref >60)
Glucose, Bld: 151 mg/dL — ABNORMAL HIGH (ref 70–99)
Potassium: 3.6 mmol/L (ref 3.5–5.1)
Sodium: 137 mmol/L (ref 135–145)
Total Bilirubin: 1.5 mg/dL — ABNORMAL HIGH (ref 0.3–1.2)
Total Protein: 8.8 g/dL — ABNORMAL HIGH (ref 6.5–8.1)

## 2019-08-27 LAB — URINALYSIS, ROUTINE W REFLEX MICROSCOPIC
Bilirubin Urine: NEGATIVE
Glucose, UA: NEGATIVE mg/dL
Ketones, ur: 20 mg/dL — AB
Nitrite: NEGATIVE
Protein, ur: 100 mg/dL — AB
Specific Gravity, Urine: 1.029 (ref 1.005–1.030)
WBC, UA: >50 WBC/hpf — ABNORMAL HIGH (ref 0–5)
pH: 5 (ref 5.0–8.0)

## 2019-08-27 LAB — CBC
HCT: 40.7 % (ref 39.0–52.0)
Hemoglobin: 13 g/dL (ref 13.0–17.0)
MCH: 28.8 pg (ref 26.0–34.0)
MCHC: 31.9 g/dL (ref 30.0–36.0)
MCV: 90.2 fL (ref 80.0–100.0)
Platelets: 488 10*3/uL — ABNORMAL HIGH (ref 150–400)
RBC: 4.51 MIL/uL (ref 4.22–5.81)
RDW: 14.9 % (ref 11.5–15.5)
WBC: 12.8 10*3/uL — ABNORMAL HIGH (ref 4.0–10.5)
nRBC: 0 % (ref 0.0–0.2)

## 2019-08-27 LAB — TROPONIN I (HIGH SENSITIVITY): Troponin I (High Sensitivity): 10 ng/L (ref <18)

## 2019-08-27 LAB — SARS CORONAVIRUS 2 (TAT 6-24 HRS): SARS Coronavirus 2: NEGATIVE

## 2019-08-27 LAB — LIPASE, BLOOD: Lipase: 18 U/L (ref 11–51)

## 2019-08-27 MED ORDER — SODIUM CHLORIDE 0.9 % IV BOLUS
500.0000 mL | Freq: Once | INTRAVENOUS | Status: AC
  Administered 2019-08-27: 17:00:00 500 mL via INTRAVENOUS

## 2019-08-27 MED ORDER — ACETAMINOPHEN 650 MG RE SUPP: 650.0000 mg | Freq: Four times a day (QID) | RECTAL | Status: DC | PRN

## 2019-08-27 MED ORDER — ONDANSETRON HCL 4 MG PO TABS: 4.0000 mg | ORAL_TABLET | Freq: Four times a day (QID) | ORAL | Status: DC | PRN

## 2019-08-27 MED ORDER — POLYETHYLENE GLYCOL 3350 17 G PO PACK: 17.0000 g | PACK | Freq: Every day | ORAL | Status: DC

## 2019-08-27 MED ORDER — FENTANYL CITRATE (PF) 100 MCG/2ML IJ SOLN
50.0000 ug | Freq: Once | INTRAMUSCULAR | Status: AC
  Administered 2019-08-27: 17:00:00 50 ug via INTRAVENOUS
  Filled 2019-08-27: qty 2

## 2019-08-27 MED ORDER — SODIUM CHLORIDE 0.9 % IV SOLN
1.0000 g | Freq: Once | INTRAVENOUS | Status: AC
  Administered 2019-08-27: 19:00:00 1 g via INTRAVENOUS
  Filled 2019-08-27: qty 10

## 2019-08-27 MED ORDER — ENOXAPARIN SODIUM 40 MG/0.4ML ~~LOC~~ SOLN
40.0000 mg | SUBCUTANEOUS | Status: DC
  Administered 2019-08-27 – 2019-08-28 (×2): 40 mg via SUBCUTANEOUS
  Filled 2019-08-27 (×2): qty 0.4

## 2019-08-27 MED ORDER — IOHEXOL 300 MG/ML  SOLN
100.0000 mL | Freq: Once | INTRAMUSCULAR | Status: AC | PRN
  Administered 2019-08-27: 100 mL via INTRAVENOUS

## 2019-08-27 MED ORDER — POLYETHYLENE GLYCOL 3350 17 G PO PACK
17.0000 g | PACK | Freq: Every day | ORAL | Status: DC
  Administered 2019-08-29: 11:00:00 17 g via ORAL
  Filled 2019-08-27 (×2): qty 1

## 2019-08-27 MED ORDER — SODIUM CHLORIDE 0.9 % IV BOLUS
500.0000 mL | Freq: Once | INTRAVENOUS | Status: AC
  Administered 2019-08-27: 500 mL via INTRAVENOUS

## 2019-08-27 MED ORDER — ATORVASTATIN CALCIUM 80 MG PO TABS
80.0000 mg | ORAL_TABLET | Freq: Every day | ORAL | Status: DC
  Administered 2019-08-28: 19:00:00 80 mg via ORAL
  Filled 2019-08-27: qty 1

## 2019-08-27 MED ORDER — ACETAMINOPHEN 325 MG PO TABS
650.0000 mg | ORAL_TABLET | Freq: Once | ORAL | Status: AC
  Administered 2019-08-27: 21:00:00 650 mg via ORAL
  Filled 2019-08-27: qty 2

## 2019-08-27 MED ORDER — METOPROLOL TARTRATE 25 MG PO TABS
25.0000 mg | ORAL_TABLET | Freq: Two times a day (BID) | ORAL | Status: DC
  Administered 2019-08-27 – 2019-08-29 (×4): 25 mg via ORAL
  Filled 2019-08-27 (×4): qty 1

## 2019-08-27 MED ORDER — ONDANSETRON HCL 4 MG/2ML IJ SOLN
4.0000 mg | Freq: Four times a day (QID) | INTRAMUSCULAR | Status: DC | PRN
  Administered 2019-08-28: 09:00:00 4 mg via INTRAVENOUS
  Filled 2019-08-27: qty 2

## 2019-08-27 MED ORDER — SODIUM CHLORIDE 0.9 % IV SOLN
1.0000 g | INTRAVENOUS | Status: DC
  Administered 2019-08-28: 1 g via INTRAVENOUS
  Filled 2019-08-27: qty 10
  Filled 2019-08-27: qty 1

## 2019-08-27 MED ORDER — TRAMADOL HCL 50 MG PO TABS: 50.0000 mg | ORAL_TABLET | Freq: Four times a day (QID) | ORAL | Status: DC | PRN

## 2019-08-27 MED ORDER — LISINOPRIL 10 MG PO TABS
10.0000 mg | ORAL_TABLET | Freq: Every day | ORAL | Status: DC
  Administered 2019-08-27 – 2019-08-29 (×3): 10 mg via ORAL
  Filled 2019-08-27 (×3): qty 1

## 2019-08-27 MED ORDER — ACETAMINOPHEN 325 MG PO TABS: 650.0000 mg | ORAL_TABLET | Freq: Four times a day (QID) | ORAL | Status: DC | PRN

## 2019-08-27 MED ORDER — ASPIRIN 81 MG PO CHEW
81.0000 mg | CHEWABLE_TABLET | Freq: Every day | ORAL | Status: DC
  Administered 2019-08-27: 22:00:00 81 mg via ORAL
  Filled 2019-08-27: qty 1

## 2019-08-27 NOTE — ED Provider Notes (Signed)
[facility] EMERGENCY DEPARTMENT Provider Note   CSN: [id number] Arrival date & time: 08/27/19  1132     History Chief Complaint  Patient presents with  . Emesis    Norman Mendez is a 73 y.o. male.  The history is provided by the patient and medical records. No language interpreter was used.  Emesis  Norman Mendez is a 73 y.o. male who presents to the Emergency Department complaining of vomiting and abdominal pain.  He presents to the ED complaining of severe left sided abdominal pain that started last night.  He had several episodes of "spit" appearing emesis (5-6) since last night.  Sxs have been waxing and waning.  Denies associated fever, sob, diarrhea, constipation, chest pain.  He does have dysuria.  No prior similar sxs.  Lives alone.  No bad food exposure.  Denies tobacco, alcohol,drug use.      Past Medical History:  Diagnosis Date  . CVA (cerebral infarction)   . Hyperlipemia   . Hypertension   . NSTEMI (non-ST elevated myocardial infarction) ([place])   . Shortness of breath dyspnea   . Sinus tachycardia 11/16/2011    Patient Active Problem List   Diagnosis Date Noted  . Complicated UTI (urinary tract infection) 08/27/2019  . Fall at home 02/14/2019  . Premature atrial beats 09/17/2018  . 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 Mild cognitive impairment 10/21/2014  . Dysarthria due to cerebrovascular accident   . 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 Essential hypertension 09/11/2014  . Hyperlipidemia 03/18/2014    Past Surgical History:  Procedure Laterality Date  . LEFT HEART CATHETERIZATION WITH CORONARY ANGIOGRAM N/A 03/18/2014   Procedure: LEFT HEART CATHETERIZATION WITH CORONARY ANGIOGRAM;  Surgeon: Sinclair Grooms, MD;  Location: [facility] CATH LAB;  Service: Cardiovascular;  Laterality: N/A;  . No previous surgery         Family History  Problem Relation Age of Onset  . Heart  disease Other        No family history    Social History   Tobacco Use  . Smoking status: Former Smoker    Packs/day: 0.20    Types: Cigarettes    Quit date: 08/18/2014    Years since quitting: 5.0  . Smokeless tobacco: Never Used  Substance Use Topics  . Alcohol use: No    Comment: 3 40 ounce beer per week  . Drug use: No    Home Medications Prior to Admission medications   Medication Sig Start Date End Date Taking? Authorizing Provider  aspirin 81 MG chewable tablet Chew 1 tablet (81 mg total) by mouth daily. 12/31/18  Yes Bufford Lope, DO  atorvastatin (LIPITOR) 80 MG tablet Take 1 tablet (80 mg total) by mouth daily at 6 PM. 12/31/18  Yes Orson Eva J, DO  lisinopril (ZESTRIL) 10 MG tablet Take 1 tablet (10 mg total) by mouth daily. 12/31/18  Yes Orson Eva J, DO  metoprolol tartrate (LOPRESSOR) 25 MG tablet Take 1 tablet (25 mg total) by mouth 2 (two) times daily. 06/19/19  Yes Patriciaann Clan, DO  nitroGLYCERIN (NITROSTAT) 0.4 MG SL tablet Place 1 tablet (0.4 mg total) under the tongue every 5 (five) minutes x 3 doses as needed for chest pain. 03/20/14   Brett Canales, PA-C    Allergies    Patient has no known allergies.  Review of Systems   Review of Systems  Gastrointestinal: Positive for vomiting.  All other  systems reviewed and are negative.   Physical Exam Updated Vital Signs BP (!) 162/106 (BP Location: Right Arm)   Pulse (!) 102   Temp 97.9 F (36.6 C) (Oral)   Resp 16   SpO2 100%   Physical Exam Vitals and nursing note reviewed.  Constitutional:      Appearance: He is well-developed.  HENT:     Head: Normocephalic and atraumatic.  Cardiovascular:     Rate and Rhythm: Regular rhythm. Tachycardia present.     Heart sounds: No murmur.  Pulmonary:     Effort: Pulmonary effort is normal. No respiratory distress.     Breath sounds: Normal breath sounds.  Abdominal:     Palpations: Abdomen is soft.     Tenderness: There is no guarding or rebound.      Comments: Mild epigastric and central abdominal tenderness.    Musculoskeletal:        General: No swelling or tenderness.     Comments: 2+ DP pulses bilaterally  Skin:    General: Skin is warm and dry.  Neurological:     Mental Status: He is alert and oriented to person, place, and time.  Psychiatric:        Behavior: Behavior normal.     ED Results / Procedures / Treatments   Labs (all labs ordered are listed, but only abnormal results are displayed) Labs Reviewed  CBC - Abnormal; Notable for the following components:      Result Value   WBC 12.8 (*)    Platelets 488 (*)    All other components within normal limits  URINALYSIS, ROUTINE W REFLEX MICROSCOPIC - Abnormal; Notable for the following components:   Color, Urine AMBER (*)    APPearance CLOUDY (*)    Hgb urine dipstick SMALL (*)    Ketones, ur 20 (*)    Protein, ur 100 (*)    Leukocytes,Ua MODERATE (*)    WBC, UA >50 (*)    Bacteria, UA MANY (*)    All other components within normal limits  COMPREHENSIVE METABOLIC PANEL - Abnormal; Notable for the following components:   Glucose, Bld 151 (*)    Total Protein 8.8 (*)    Total Bilirubin 1.5 (*)    GFR calc non Af Amer 59 (*)    All other components within normal limits  SARS CORONAVIRUS 2 (TAT 6-24 HRS)  URINE CULTURE  URINE CULTURE  LIPASE, BLOOD  CBC  TROPONIN I (HIGH SENSITIVITY)    EKG EKG Interpretation  Date/Time:  Wednesday August 27 2019 16:59:16 EST Ventricular Rate:  117 PR Interval:  134 QRS Duration: 84 QT Interval:  332 QTC Calculation: 463 R Axis:   -11 Text Interpretation: Sinus tachycardia with Premature supraventricular complexes and with occasional Premature ventricular complexes Possible Left atrial enlargement Borderline ECG Confirmed by Quintella Reichert [phone]) on 08/27/2019 5:07:36 PM   Radiology CT Abdomen Pelvis W Contrast  Result Date: 08/27/2019 CLINICAL DATA:  Left-sided abdominal pain and vomiting times 24 hours. EXAM:  CT ABDOMEN AND PELVIS WITH CONTRAST TECHNIQUE: Multidetector CT imaging of the abdomen and pelvis was performed using the standard protocol following bolus administration of intravenous contrast. CONTRAST:  173mL OMNIPAQUE IOHEXOL 300 MG/ML  SOLN COMPARISON:  None. FINDINGS: Lower chest: The lung bases are clear. The heart size is normal. Hepatobiliary: The liver is normal. Normal gallbladder.There is no biliary ductal dilation. Pancreas: Normal contours without ductal dilatation. No peripancreatic fluid collection. Spleen: No splenic laceration or hematoma. Adrenals/Urinary Tract: --Adrenal glands:  No adrenal hemorrhage. --Right kidney/ureter: No hydronephrosis or perinephric hematoma. --Left kidney/ureter: No hydronephrosis or perinephric hematoma. --Urinary bladder: Unremarkable. Stomach/Bowel: --Stomach/Duodenum: No hiatal hernia or other gastric abnormality. Normal duodenal course and caliber. --Small bowel: No dilatation or inflammation. --Colon: There is scattered colonic diverticula without CT evidence for diverticulitis. There is a large amount of stool throughout the colon. The transverse colon is distended. There is a gradual tapering of the descending colon without evidence for a colonic mass, however evaluation is limited by lack of oral contrast at this level. --Appendix: Normal. Vascular/Lymphatic: Atherosclerotic calcification is present within the non-aneurysmal abdominal aorta, without hemodynamically significant stenosis. There is moderate narrowing at the origin of the celiac axis. Atherosclerotic disease is noted of the bilateral renal arteries. --there is retroperitoneal adenopathy. For example there is a necrotic 2.1 cm right periaortic lymph node. --No mesenteric lymphadenopathy. --there are pathologically enlarged pelvic and inguinal lymph nodes. For example there is a 1.8 cm right inguinal lymph node. There is a 1.6 cm right pelvic sidewall lymph node. Reproductive: Unremarkable Other: No  ascites or free air. The abdominal wall is normal. Musculoskeletal. No acute displaced fractures. IMPRESSION: 1. Moderate gaseous distention of the colon without evidence for colitis or diverticulitis. There is no obvious obstructing lesion. 2. Multiple pathologically enlarged retroperitoneal pelvic lymph nodes are noted as detailed above. These are concerning for nodal metastatic disease. Many of these lymph nodes are amenable to percutaneous biopsy as clinically indicated. Electronically Signed   By: Constance Holster M.D.   On: 08/27/2019 18:56    Procedures Procedures (including critical care time)  Medications Ordered in ED Medications  cefTRIAXone (ROCEPHIN) 1 g in sodium chloride 0.9 % 100 mL IVPB (has no administration in time range)  aspirin chewable tablet 81 mg (81 mg Oral Given 08/27/19 2133)  atorvastatin (LIPITOR) tablet 80 mg (has no administration in time range)  lisinopril (ZESTRIL) tablet 10 mg (10 mg Oral Given 08/27/19 2135)  metoprolol tartrate (LOPRESSOR) tablet 25 mg (25 mg Oral Given 08/27/19 2133)  enoxaparin (LOVENOX) injection 40 mg ( Subcutaneous Canceled Entry 08/28/19 0300)  acetaminophen (TYLENOL) tablet 650 mg (has no administration in time range)    Or  acetaminophen (TYLENOL) suppository 650 mg (has no administration in time range)  traMADol (ULTRAM) tablet 50 mg (has no administration in time range)  ondansetron (ZOFRAN) tablet 4 mg (has no administration in time range)    Or  ondansetron (ZOFRAN) injection 4 mg (has no administration in time range)  polyethylene glycol (MIRALAX / GLYCOLAX) packet 17 g (has no administration in time range)  influenza vaccine adjuvanted (FLUAD) injection 0.5 mL (has no administration in time range)  fentaNYL (SUBLIMAZE) injection 50 mcg (50 mcg Intravenous Given 08/27/19 1718)  sodium chloride 0.9 % bolus 500 mL (0 mLs Intravenous Stopped 08/27/19 1851)  cefTRIAXone (ROCEPHIN) 1 g in sodium chloride 0.9 % 100 mL IVPB (0 g  Intravenous Stopped 08/27/19 1933)  iohexol (OMNIPAQUE) 300 MG/ML solution 100 mL (100 mLs Intravenous Contrast Given 08/27/19 1821)  acetaminophen (TYLENOL) tablet 650 mg (650 mg Oral Given 08/27/19 2035)  sodium chloride 0.9 % bolus 500 mL (500 mLs Intravenous New Bag/Given 08/27/19 2345)    ED Course  I have reviewed the triage vital signs and the nursing notes.  Pertinent labs & imaging results that were available during my care of the patient were reviewed by me and considered in my medical decision making (see chart for details).    MDM Rules/Calculators/A&P  Patient here for evaluation of abdominal pain, vomiting since last night. He is tachycardic with mild abdominal tenderness on examination. Urinalysis is consistent with UTI, will send culture and treat with antibiotics. CT scan demonstrates multiple intra-abdominal lymph nodes concerning for possible metastatic disease process. Discussed with patient findings of studies. Given urinary tract infection with recurrent vomiting, medicine consulted for admission for symptomatic management. Will need further evaluation of intra-abdominal lymphadenopathy.  Final Clinical Impression(s) / ED Diagnoses Final diagnoses:  Acute UTI  Lymphadenopathy    Rx / DC Orders ED Discharge Orders    None       Quintella Reichert, MD 08/28/19 [phone]

## 2019-08-27 NOTE — ED Notes (Signed)
Report attempted to [facility], advised unavailable to take report at this time.

## 2019-08-27 NOTE — ED Triage Notes (Signed)
Pt endorses intermittent abd/CP that started at 6:30. Pain occurs when vomiting. LUQ pain. Denies SOB or dizziness. 324 ASA given. 18G LAC. 12 lead unremarkable.

## 2019-08-27 NOTE — H&P (Signed)
[facility] Admission History and Physical Service Pager: [phone]  Patient name: Norman Mendez Medical record number: [id number] Date of birth: [birth year] Age: 73 y.o. Gender: male  Primary Care Provider: Patriciaann Clan, DO Consultants: consider IR consult Code Status: full Preferred Emergency Contact: daughter Norman Mendez) [phone]  Chief Complaint: abdominal pain  Assessment and Plan: Norman Mendez is a 73 y.o. male presenting with severe abdominal pain x1day . PMH is significant for HLD, HTN, h/o CVA, cognitive impairment, PAC  UTI- patient's abdominal pain not specific to UTI and has resolved currently, but history is suggestive and urinalysis significant for moderate leukocytes, >50WBC, many bacteria, WBC clumps, mucus, hyaline casts, and amorphous crystals. Also positive for 20 ketones and small hgb, 100 protein. Abdominal CT did not show renal stones or hydronephrosis. Treated with CTX and fentanyl in ED. Due to signs of dehydration on exam and urinalysis- will give IV fluid bolus as well as flomax- which patient has on med history list but has not filled since maybe May and denies taking. There is a possibility of renal stones not seen on CT given blood in urine so could consider [place] f/u to assess. Vital signs stable and afebrile so not concerned for sepsis at this time. hst negative x1, EKG neg acute changes or signs of ischemia. - admit to inpatient- observation, attending Dr. Erin Hearing - IV fluid bolus for hydration - flomax - bladder scan to assess for retention - follow up urine culture - continue CTX inpatient, transition to PO for outpatient tx - strict I/O - morning CMP to assess electrolytes s/p emesis at home - morning CBC to trend WBC (12.8 on admission) - Consider DRE for prostate exam which was not performed as patient was in hallway bed for assessment.  - for pain  - tylenol PRN  - tramadol PRN - for nausea  - zofran  PRN  Pathologically Enlarged Retroperitoneal Lymph nodes- incidentally found on abdominal CT. Concerning for metastatic disease. Patient denies known history of cancer and does not recall getting colon or prostate cancer screening in the past. Location of abnormal nodes amenable to percutaneous biopsy per radiology report.  - IR consult for biopsy?  Constipation- moderate distention of colon without evidence of colitis or diverticulitis on CT. Patient endorses hard BM yesterday which is normal for him. Denies hematochezia/melena. Could be symptomatic of incidental finding above.  - miralax daily  Hyperglycemia- blood glucose 151 and ketones in urine. No history of diabetes. Hgb A1c was 5.7 in February.  - rehydrate - recheck serum glucose on morning CMP  HTN- hypertensive on admission 160s/80-120. Home meds= lisinopril 10, metoprolol 25 BID, asa. - continue home meds - vitals per floor protocol  HLD- home med= atorvastatin 80. Last lipid panel 2018 showed total 258, LDL 186. Patient has h/o CVA.  - continue home med   FEN/GI: 519mL IV bolus, heart healthy diet Prophylaxis: lovenox  Disposition: observation for UTI treatment and possible lymphadenopathy w/u  History of Present Illness:  Norman Mendez is a 73 y.o. male presenting with severe left sided abdominal pain that began acutely last night. The pain waxes and wanes and is not currently bothering the patient. It was associated with non-bloody emesis 5-6 times today but no other sick symptoms such as fever, diarrhea, SOB, chest pain, muscle aches. He has not felt this type of pain before. 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 History of constipation with last BM yesterday which was hard  and normal for him. No blood in stool. Takes no medications for these chronic conditions.  Denies any history of colon or prostate cancer screenings.   Review Of Systems: Per HPI with the following  additions:  Review of Systems  Constitutional: Negative for chills, fever and malaise/fatigue.  Respiratory: Negative for cough and shortness of breath.   Cardiovascular: Negative for chest pain.  Gastrointestinal: Positive for abdominal pain, constipation, nausea and vomiting. Negative for blood in stool, diarrhea, heartburn and melena.  Genitourinary: Positive for dysuria and flank pain. Negative for hematuria.  Musculoskeletal: Negative for myalgias.    Patient Active Problem List   Diagnosis Date Noted  . Fall at home 02/14/2019  . Premature atrial beats 09/17/2018  . 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 Mild cognitive impairment 10/21/2014  . Dysarthria due to cerebrovascular accident   . Cerebral thrombosis with cerebral infarction ([place]) 10/20/2014  . Essential hypertension 09/11/2014  . Hyperlipidemia 03/18/2014    Past Medical History: Past Medical History:  Diagnosis Date  . CVA (cerebral infarction)   . Hyperlipemia   . Hypertension   . NSTEMI (non-ST elevated myocardial infarction) ([place])   . Shortness of breath dyspnea   . Sinus tachycardia 11/16/2011    Past Surgical History: Past Surgical History:  Procedure Laterality Date  . LEFT HEART CATHETERIZATION WITH CORONARY ANGIOGRAM N/A 03/18/2014   Procedure: LEFT HEART CATHETERIZATION WITH CORONARY ANGIOGRAM;  Surgeon: Sinclair Grooms, MD;  Location: [facility] CATH LAB;  Service: Cardiovascular;  Laterality: N/A;  . No previous surgery      Social History: Social History   Tobacco Use  . Smoking status: Former Smoker    Packs/day: 0.20    Types: Cigarettes    Quit date: 08/18/2014    Years since quitting: 5.0  . Smokeless tobacco: Never Used  Substance Use Topics  . Alcohol use: No    Comment: 3 40 ounce beer per week  . Drug use: No   Additional social history: lives alone Please also refer to relevant sections of EMR.  Family History: Family History  Problem Relation Age  of Onset  . Heart disease Other        No family history   Allergies and Medications: No Known Allergies No current facility-administered medications on file prior to encounter.   Current Outpatient Medications on File Prior to Encounter  Medication Sig Dispense Refill  . aspirin 81 MG chewable tablet Chew 1 tablet (81 mg total) by mouth daily. 90 tablet 3  . atorvastatin (LIPITOR) 80 MG tablet Take 1 tablet (80 mg total) by mouth daily at 6 PM. 90 tablet 3  . gabapentin (NEURONTIN) 100 MG capsule Take 1 capsule (100 mg total) by mouth 3 (three) times daily as needed. 90 capsule 3  . lisinopril (ZESTRIL) 10 MG tablet Take 1 tablet (10 mg total) by mouth daily. 90 tablet 3  . metoprolol tartrate (LOPRESSOR) 25 MG tablet Take 1 tablet (25 mg total) by mouth 2 (two) times daily. 180 tablet 1  . nitroGLYCERIN (NITROSTAT) 0.4 MG SL tablet Place 1 tablet (0.4 mg total) under the tongue every 5 (five) minutes x 3 doses as needed for chest pain. 25 tablet 12  . oxybutynin (DITROPAN) 5 MG tablet Take 0.5 tablets (2.5 mg total) by mouth 2 (two) times daily. 60 tablet 0  . tamsulosin (FLOMAX) 0.4 MG CAPS capsule Take one tablet (0.4mg ) by mouth once daily after breakfast. If response is inadequate after 2 weeks increase  to two pills (0.8mg ) daily. 30 capsule 3   Objective: BP (!) 160/95   Pulse (!) 106   Temp 98 F (36.7 C)   Resp 17   SpO2 98%  Exam: General: NAD, elderly gentleman  Eyes: senile arcus present ENTM: dry mucous membranes Neck: soft, nontender Cardiovascular: difficult to auscultate with the contact stethoscope but did not hear obvious abnormal rhythm or murmur. Slightly tachycardic from pulse palpation Respiratory: CTAB, good effort, no accessory muscle use Gastrointestinal: abdomen soft without pulsatile mass. No guarding. Patient endorsed tenderness to umbilicus area but did not wince/grimmace with palpation anywhere.   MSK: frail, no LE edema Derm: negative for rashes or  lesions Neuro: alert and oriented, no obvious focal deficits.  Psych: shows some cognitive slowing  Labs and Imaging: CBC BMET  Recent Labs  Lab 08/27/19 1152  WBC 12.8*  HGB 13.0  HCT 40.7  PLT 488*   Recent Labs  Lab 08/27/19 1655  NA 137  K 3.6  CL 101  CO2 22  BUN 13  CREATININE 1.21  GLUCOSE 151*  CALCIUM 9.4     EKG: sinus tach with PVC, no signs of ischemia or new changes   Richarda Osmond, DO 08/27/2019, 7:58 PM PGY-2, [place] Intern pager: [phone], text pages welcome

## 2019-08-28 ENCOUNTER — Observation Stay (HOSPITAL_COMMUNITY): Payer: Medicare Other

## 2019-08-28 ENCOUNTER — Encounter (HOSPITAL_COMMUNITY): Payer: Self-pay | Admitting: Family Medicine

## 2019-08-28 DIAGNOSIS — Z23 Encounter for immunization: Secondary | ICD-10-CM | POA: Diagnosis not present

## 2019-08-28 DIAGNOSIS — I252 Old myocardial infarction: Secondary | ICD-10-CM | POA: Diagnosis not present

## 2019-08-28 DIAGNOSIS — R112 Nausea with vomiting, unspecified: Secondary | ICD-10-CM | POA: Diagnosis present

## 2019-08-28 DIAGNOSIS — Z20822 Contact with and (suspected) exposure to covid-19: Secondary | ICD-10-CM | POA: Diagnosis present

## 2019-08-28 DIAGNOSIS — Z87891 Personal history of nicotine dependence: Secondary | ICD-10-CM | POA: Diagnosis not present

## 2019-08-28 DIAGNOSIS — E785 Hyperlipidemia, unspecified: Secondary | ICD-10-CM | POA: Diagnosis present

## 2019-08-28 DIAGNOSIS — N390 Urinary tract infection, site not specified: Secondary | ICD-10-CM | POA: Diagnosis present

## 2019-08-28 DIAGNOSIS — R739 Hyperglycemia, unspecified: Secondary | ICD-10-CM | POA: Diagnosis present

## 2019-08-28 DIAGNOSIS — K5909 Other constipation: Secondary | ICD-10-CM | POA: Diagnosis present

## 2019-08-28 DIAGNOSIS — Z7982 Long term (current) use of aspirin: Secondary | ICD-10-CM | POA: Diagnosis not present

## 2019-08-28 DIAGNOSIS — R590 Localized enlarged lymph nodes: Secondary | ICD-10-CM | POA: Diagnosis present

## 2019-08-28 DIAGNOSIS — R591 Generalized enlarged lymph nodes: Secondary | ICD-10-CM | POA: Diagnosis present

## 2019-08-28 DIAGNOSIS — Z8673 Personal history of transient ischemic attack (TIA), and cerebral infarction without residual deficits: Secondary | ICD-10-CM | POA: Diagnosis not present

## 2019-08-28 DIAGNOSIS — R32 Unspecified urinary incontinence: Secondary | ICD-10-CM | POA: Diagnosis present

## 2019-08-28 DIAGNOSIS — I10 Essential (primary) hypertension: Secondary | ICD-10-CM | POA: Diagnosis present

## 2019-08-28 HISTORY — DX: Urinary tract infection, site not specified: N39.0

## 2019-08-28 LAB — BASIC METABOLIC PANEL
Anion gap: 10 (ref 5–15)
BUN: 10 mg/dL (ref 8–23)
CO2: 23 mmol/L (ref 22–32)
Calcium: 8.6 mg/dL — ABNORMAL LOW (ref 8.9–10.3)
Chloride: 106 mmol/L (ref 98–111)
Creatinine, Ser: 0.85 mg/dL (ref 0.61–1.24)
GFR calc Af Amer: >60 mL/min (ref >60)
GFR calc non Af Amer: >60 mL/min (ref >60)
Glucose, Bld: 104 mg/dL — ABNORMAL HIGH (ref 70–99)
Potassium: 3.8 mmol/L (ref 3.5–5.1)
Sodium: 139 mmol/L (ref 135–145)

## 2019-08-28 LAB — CBC
HCT: 36.4 % — ABNORMAL LOW (ref 39.0–52.0)
Hemoglobin: 11.8 g/dL — ABNORMAL LOW (ref 13.0–17.0)
MCH: 28.8 pg (ref 26.0–34.0)
MCHC: 32.4 g/dL (ref 30.0–36.0)
MCV: 88.8 fL (ref 80.0–100.0)
Platelets: 271 10*3/uL (ref 150–400)
RBC: 4.1 MIL/uL — ABNORMAL LOW (ref 4.22–5.81)
RDW: 14.4 % (ref 11.5–15.5)
WBC: 9.8 10*3/uL (ref 4.0–10.5)
nRBC: 0 % (ref 0.0–0.2)

## 2019-08-28 LAB — PSA: Prostatic Specific Antigen: 0.39 ng/mL (ref 0.00–4.00)

## 2019-08-28 MED ORDER — IOHEXOL 300 MG/ML  SOLN
75.0000 mL | Freq: Once | INTRAMUSCULAR | Status: AC | PRN
  Administered 2019-08-28: 15:00:00 75 mL via INTRAVENOUS

## 2019-08-28 MED ORDER — INFLUENZA VAC A&B SA ADJ QUAD 0.5 ML IM PRSY
0.5000 mL | PREFILLED_SYRINGE | INTRAMUSCULAR | Status: AC
  Administered 2019-08-29: 11:00:00 0.5 mL via INTRAMUSCULAR
  Filled 2019-08-28: qty 0.5

## 2019-08-28 NOTE — Plan of Care (Signed)
  Problem: Education: Goal: Knowledge of General Education information will improve Description: Including pain rating scale, medication(s)/side effects and non-pharmacologic comfort measures Outcome: Progressing   Problem: Safety: Goal: Ability to remain free from injury will improve Outcome: Progressing   Problem: Activity: Goal: Risk for activity intolerance will decrease Outcome: Progressing

## 2019-08-28 NOTE — Progress Notes (Addendum)
Family Medicine Teaching Service Daily Progress Note Intern Pager: [phone]  Patient name: Norman Mendez Medical record number: [id number] Date of birth: 1946/04/06 Age: 73 y.o. Gender: male  Primary Care Provider: Patriciaann Clan, DO Consultants: None Code Status: Full  Pt Overview and Major Events to Date:  12/30-admitted  Assessment and Plan: Norman Mendez is a 73 y.o. male presenting with severe abdominal pain x1day . PMH is significant for HLD, HTN, h/o CVA, cognitive impairment, PAC  UTI Patient with urine showing moderate leukocyte esterase, negative nitrite, many bacteria present.  Urine culture pending.  Vital signs stable with patient afebrile. CBC Abdominal CT did not show renal stones or hydronephrosis.  - f/u urine cx - Cont. CTX, can transition to PO for outpatient - Strict I's/O's - Consider DRE for prostate exam - Tylenol PRN for pain with Tramadol PRN if needed - Zofran PRN for nausea -Post rounds addendum: Bladder scan completed earlier today shows 51 mL  Enlarged Retroperitoneal Lymph Nodes: Incidentally found on CT abdomen pelvis.  Multiple pathologically enlarged retroperitoneal pelvic lymph nodes as noted above, concerning for nodal metastatic disease.  If malignant possible etiologies can include colon cancer or prostate cancer.  Patient has never had a colonoscopy. -PSA ordered  -Discussed finding with patient.  Patient states he would like to proceed with further work-up to determine the cause of the pathologically and enlarged lymph nodes.  Constipation CT shows moderate distention of colon without evidence of colitis or diverticulitis.  Patient denies hematochezia or melena. -MiraLAX daily  Hyperglycemia Blood glucose 151 with ketones in urine on admission.  Patient with A1c of 5.7 in February -Continue to monitor glucose via BMP  HTN Current BP 182/88.  Home medications include lisinopril 10, metoprolol 25 twice daily. -Continue home  medications  HLD Home medications include atorvastatin 80. -Continue home medications  FEN/GI: Heart healthy diet PPx: Lovenox  Disposition: obs for UTI treatment, possible lymphadenopathy w/u  Subjective:  Patient complains of some suprapubic tenderness likely related to urinary tract infection on exam this morning.    Discussed with patient the finding of his CT scan including the pathological enlarged retroperitoneal pelvic lymph nodes which can be suggestive of a malignancy.  Patient tearful during encounter but states he would like to have further work-up to determine the cause of these enlarged lymph nodes.  Patient gave permission to discuss these findings and his medical care with his daughter, Norman Mendez.  Patient understands that this work-up may be done in the outpatient setting.  Objective: Temp:  [97.4 F (36.3 C)-98 F (36.7 C)] 97.9 F (36.6 C) (12/31 0613) Pulse Rate:  [77-114] 77 (12/31 0613) Resp:  [14-18] 16 (12/31 [id number]) BP: (160-202)/(80-120) 182/88 (12/31 0613) SpO2:  [98 %-100 %] 99 % (12/31 [id number]) Physical Exam:  General: Alert and oriented to person, place, time Heart: S1/S2 with no murmurs appreciated Lungs: CTA bilaterally, no wheezing Abdomen: Bowel sounds present, some suprapubic abdominal discomfort to palpation Skin: Warm and dry  Laboratory: Recent Labs  Lab 08/27/19 1152 08/28/19 0526  WBC 12.8* 9.8  HGB 13.0 11.8*  HCT 40.7 36.4*  PLT 488* 271   Recent Labs  Lab 08/27/19 1655  NA 137  K 3.6  CL 101  CO2 22  BUN 13  CREATININE 1.21  CALCIUM 9.4  PROT 8.8*  BILITOT 1.5*  ALKPHOS 97  ALT 22  AST 20  GLUCOSE 151*      Imaging/Diagnostic Tests: CT Abdomen Pelvis W Contrast  Result Date: 08/27/2019 CLINICAL DATA:  Left-sided abdominal pain and vomiting times 24 hours. EXAM: CT ABDOMEN AND PELVIS WITH CONTRAST TECHNIQUE: Multidetector CT imaging of the abdomen and pelvis was performed using the standard protocol following bolus  administration of intravenous contrast. CONTRAST:  141mL OMNIPAQUE IOHEXOL 300 MG/ML  SOLN COMPARISON:  None. FINDINGS: Lower chest: The lung bases are clear. The heart size is normal. Hepatobiliary: The liver is normal. Normal gallbladder.There is no biliary ductal dilation. Pancreas: Normal contours without ductal dilatation. No peripancreatic fluid collection. Spleen: No splenic laceration or hematoma. Adrenals/Urinary Tract: --Adrenal glands: No adrenal hemorrhage. --Right kidney/ureter: No hydronephrosis or perinephric hematoma. --Left kidney/ureter: No hydronephrosis or perinephric hematoma. --Urinary bladder: Unremarkable. Stomach/Bowel: --Stomach/Duodenum: No hiatal hernia or other gastric abnormality. Normal duodenal course and caliber. --Small bowel: No dilatation or inflammation. --Colon: There is scattered colonic diverticula without CT evidence for diverticulitis. There is a large amount of stool throughout the colon. The transverse colon is distended. There is a gradual tapering of the descending colon without evidence for a colonic mass, however evaluation is limited by lack of oral contrast at this level. --Appendix: Normal. Vascular/Lymphatic: Atherosclerotic calcification is present within the non-aneurysmal abdominal aorta, without hemodynamically significant stenosis. There is moderate narrowing at the origin of the celiac axis. Atherosclerotic disease is noted of the bilateral renal arteries. --there is retroperitoneal adenopathy. For example there is a necrotic 2.1 cm right periaortic lymph node. --No mesenteric lymphadenopathy. --there are pathologically enlarged pelvic and inguinal lymph nodes. For example there is a 1.8 cm right inguinal lymph node. There is a 1.6 cm right pelvic sidewall lymph node. Reproductive: Unremarkable Other: No ascites or free air. The abdominal wall is normal. Musculoskeletal. No acute displaced fractures. IMPRESSION: 1. Moderate gaseous distention of the colon  without evidence for colitis or diverticulitis. There is no obvious obstructing lesion. 2. Multiple pathologically enlarged retroperitoneal pelvic lymph nodes are noted as detailed above. These are concerning for nodal metastatic disease. Many of these lymph nodes are amenable to percutaneous biopsy as clinically indicated. Electronically Signed   By: Constance Holster M.D.   On: 08/27/2019 18:56    Lurline Del, DO 08/28/2019, 6:47 AM PGY-1, [facility] Intern pager: [phone], text pages welcome

## 2019-08-28 NOTE — Progress Notes (Signed)
Spoke with IR regarding possibility of further work-up of patient's retroperitoneal lymphadenopathy.  IR physician said it would be unlikely for patient to be able to receive a biopsy prior to Monday but patient would likely have an the right inguinal lymph node biopsy to further work-up this lymphadenopathy.  IR physician stated patient can have his work-up done in the hospital if he was still here or could be done as an outpatient.  He further stated that we can continue the work-up with a diagnostic chest CT to further evaluate for primary malignancy.  Order for chest CT placed

## 2019-08-28 NOTE — Discharge Summary (Signed)
[facility] Discharge Summary  Patient name: Norman Mendez Medical record number: [id number] Date of birth: 03-Apr-1946 Age: 73 y.o. Gender: male Date of Admission: 08/27/2019  Date of Discharge: 08/29/2019 Admitting Physician: Lurline Del, DO  Primary Care Provider: Patriciaann Clan, DO Consultants: None, IR curbsided  Indication for Hospitalization: Abdominal pain/UTI   Discharge Diagnoses/Problem List:  Retroperitoneal lymphadenopathy Hyperlipidemia Essential hypertension  Disposition: home  Discharge Condition: improved  Discharge Exam:  General: elderly male, lying in bed, in NAD Heart: RRR, no murmur Lungs: CTAB, normal WOB on RA. Abdomen: soft, NTND, +BS Skin: no visible rashes or lesions  Brief Hospital Course:  Patient is a 73 year old male that was admitted due to abdominal pain and UTI.   UTI Found to have an initial UA that was suspected for urinary tract infection.  Patient was started on ceftriaxone with urine culture pending at the time of admission.  Initial rrine culture contaminated, repeat reincubating at discharge. Patient improved with IV CTX and transitioned to PO Keflex at discharge with last day 1/6.  Retroperitoneal lymphadenopathy: In order to look for hydronephrosis, kidney stones, or other etiologies that could cause the patient's abdominal pain on admission, an abdominal CT was ordered.  The CT showed multiple enlarged retroperitoneal lymph nodes concerning for possible metastatic disease.  Patient was informed of these findings and stated that he would like to have this further worked up. PSA was drawn and was within normal limits. Chest CT obtained which did not have evidence of primary malignancy or enlarged lymph nodes. Consulted IR who recommended lymph node biopsy outpatient. Patient also has never had screening colonoscopy, would recommend obtaining outpatient.   Issues for Follow Up:  1. Recommend outpatient IR  follow-up for right inguinal lymph node biopsy to further work-up patient's retroperitoneal lymphadenopathy. 2. Recommend GI referral for screening colonoscopy. 3. Ensure completion of PO antibiotics (ends 09/03/19). 4. Follow up repeat urine culture results.  Significant Procedures: none  Significant Labs and Imaging:  Recent Labs  Lab 08/27/19 1152 08/28/19 0526  WBC 12.8* 9.8  HGB 13.0 11.8*  HCT 40.7 36.4*  PLT 488* 271   Recent Labs  Lab 08/27/19 1655 08/28/19 1143  NA 137 139  K 3.6 3.8  CL 101 106  CO2 22 23  GLUCOSE 151* 104*  BUN 13 10  CREATININE 1.21 0.85  CALCIUM 9.4 8.6*  ALKPHOS 97  --   AST 20  --   ALT 22  --   ALBUMIN 3.5  --     CT CHEST W CONTRAST  Result Date: 08/28/2019 CLINICAL DATA:  Lymphadenopathy. EXAM: CT CHEST WITH CONTRAST TECHNIQUE: Multidetector CT imaging of the chest was performed during intravenous contrast administration. CONTRAST:  37mL OMNIPAQUE IOHEXOL 300 MG/ML  SOLN COMPARISON:  November 16, 2011. FINDINGS: Cardiovascular: There is no large centrally located pulmonary embolism. Detection of smaller pulmonary emboli is limited by study technique and contrast timing. Aortic calcifications are noted. There is no thoracic aortic aneurysm or dissection. No significant pericardial effusion. Mediastinum/Nodes: --No mediastinal or hilar lymphadenopathy. --No axillary lymphadenopathy. --No supraclavicular lymphadenopathy. --Normal thyroid gland. --The esophagus is unremarkable Lungs/Pleura: No pulmonary nodules or masses. No pleural effusion or pneumothorax. No focal airspace consolidation. No focal pleural abnormality. Upper Abdomen: No acute abnormality. Musculoskeletal: No chest wall abnormality. No acute or significant osseous findings. 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 The defect in the diaphragm measures approximately 1  cm in length. Review of the MIP images  confirms the above findings. IMPRESSION: 1. No acute abnormality. 2. No evidence for metastatic disease within the thorax. Aortic Atherosclerosis (ICD10-I70.0). Electronically Signed   By: Constance Holster M.D.   On: 08/28/2019 15:18   CT Abdomen Pelvis W Contrast  Result Date: 08/27/2019 CLINICAL DATA:  Left-sided abdominal pain and vomiting times 24 hours. EXAM: CT ABDOMEN AND PELVIS WITH CONTRAST TECHNIQUE: Multidetector CT imaging of the abdomen and pelvis was performed using the standard protocol following bolus administration of intravenous contrast. CONTRAST:  140mL OMNIPAQUE IOHEXOL 300 MG/ML  SOLN COMPARISON:  None. FINDINGS: Lower chest: The lung bases are clear. The heart size is normal. Hepatobiliary: The liver is normal. Normal gallbladder.There is no biliary ductal dilation. Pancreas: Normal contours without ductal dilatation. No peripancreatic fluid collection. Spleen: No splenic laceration or hematoma. Adrenals/Urinary Tract: --Adrenal glands: No adrenal hemorrhage. --Right kidney/ureter: No hydronephrosis or perinephric hematoma. --Left kidney/ureter: No hydronephrosis or perinephric hematoma. --Urinary bladder: Unremarkable. Stomach/Bowel: --Stomach/Duodenum: No hiatal hernia or other gastric abnormality. Normal duodenal course and caliber. --Small bowel: No dilatation or inflammation. --Colon: There is scattered colonic diverticula without CT evidence for diverticulitis. There is a large amount of stool throughout the colon. The transverse colon is distended. There is a gradual tapering of the descending colon without evidence for a colonic mass, however evaluation is limited by lack of oral contrast at this level. --Appendix: Normal. Vascular/Lymphatic: Atherosclerotic calcification is present within the non-aneurysmal abdominal aorta, without hemodynamically significant stenosis. There is moderate narrowing at the origin of the celiac axis. Atherosclerotic disease is noted of the  bilateral renal arteries. --there is retroperitoneal adenopathy. For example there is a necrotic 2.1 cm right periaortic lymph node. --No mesenteric lymphadenopathy. --there are pathologically enlarged pelvic and inguinal lymph nodes. For example there is a 1.8 cm right inguinal lymph node. There is a 1.6 cm right pelvic sidewall lymph node. Reproductive: Unremarkable Other: No ascites or free air. The abdominal wall is normal. Musculoskeletal. No acute displaced fractures. IMPRESSION: 1. Moderate gaseous distention of the colon without evidence for colitis or diverticulitis. There is no obvious obstructing lesion. 2. Multiple pathologically enlarged retroperitoneal pelvic lymph nodes are noted as detailed above. These are concerning for nodal metastatic disease. Many of these lymph nodes are amenable to percutaneous biopsy as clinically indicated. Electronically Signed   By: Constance Holster M.D.   On: 08/27/2019 18:56   Results/Tests Pending at Time of Discharge: Urine culture  Discharge Medications:  Allergies as of 08/29/2019   No Known Allergies     Medication List    TAKE these medications   aspirin 81 MG chewable tablet Chew 1 tablet (81 mg total) by mouth daily.   atorvastatin 80 MG tablet Commonly known as: LIPITOR Take 1 tablet (80 mg total) by mouth daily at 6 PM.   cephALEXin 500 MG capsule Commonly known as: KEFLEX Take 1 capsule (500 mg total) by mouth 2 (two) times daily for 5 days.   lisinopril 10 MG tablet Commonly known as: ZESTRIL Take 1 tablet (10 mg total) by mouth daily.   metoprolol tartrate 25 MG tablet Commonly known as: LOPRESSOR Take 1 tablet (25 mg total) by mouth 2 (two) times daily.   nitroGLYCERIN 0.4 MG SL tablet Commonly known as: NITROSTAT Place 1 tablet (0.4 mg total) under the tongue every 5 (five) minutes x 3 doses as needed for chest pain.   polyethylene glycol 17 g packet Commonly known as: MIRALAX / GLYCOLAX  Take 17 g by mouth daily. Start  taking on: August 30, 2019       Discharge Instructions: Please refer to Patient Instructions section of EMR for full details.  Patient was counseled important signs and symptoms that should prompt return to medical care, changes in medications, dietary instructions, activity restrictions, and follow up appointments.   Follow-Up Appointments: Follow-up Information    Patriciaann Clan, DO Follow up on 09/01/2019.   Specialty: Family Medicine Why: @ 2:30pm. Please arrive 15 minutes prior to appointment time. Contact information: [address] [place] [address] [place], [place], DO 08/29/2019, 12:22 PM PGY-3, [facility]

## 2019-08-29 LAB — URINE CULTURE: Culture: 30000 — AB

## 2019-08-29 MED ORDER — CEPHALEXIN 500 MG PO CAPS
500.0000 mg | ORAL_CAPSULE | Freq: Two times a day (BID) | ORAL | Status: DC
  Administered 2019-08-29: 500 mg via ORAL
  Filled 2019-08-29: qty 1

## 2019-08-29 MED ORDER — POLYETHYLENE GLYCOL 3350 17 G PO PACK: 17.0000 g | PACK | Freq: Every day | ORAL | 0 refills | Status: DC

## 2019-08-29 MED ORDER — CEPHALEXIN 500 MG PO CAPS: 500.0000 mg | ORAL_CAPSULE | Freq: Two times a day (BID) | ORAL | 0 refills | Status: AC

## 2019-08-29 NOTE — Progress Notes (Signed)
Family Medicine Teaching Service Daily Progress Note Intern Pager: [phone]  Patient name: Norman Mendez Medical record number: [id number] Date of birth: 01-27-1946 Age: 74 y.o. Gender: male  Primary Care Provider: Patriciaann Clan, DO Consultants: None Code Status: Full  Pt Overview and Major Events to Date:  12/30-admitted for lower abd pain, UTI  Assessment and Plan: Tia Maga is a 74 y.o. male presenting with severe abdominal pain x1day . PMH is significant for HLD, HTN, h/o CVA, cognitive impairment, PAC  UTI Patient with urine showing moderate leukocyte esterase, negative nitrite, many bacteria present.  Urine culture reincubating.  PVR unremarkable. Continues to be afebrile without vital sign instability. Is without abdominal pain or difficulties urinating today.  - f/u urine cx - transition to PO today - Strict I's/O's - Consider DRE for prostate exam  Enlarged Retroperitoneal Lymph Nodes: Multiple pathologically enlarged retroperitoneal pelvic lymph nodes incidentally noted on CT A/P concerning for possible metastatic disease. PSA wnl. CT Chest without evidence of pulmonary primary malignancy. - plan for further workup with colonoscopy and lymph node biopsy outpatient  Constipation, chronic CT shows moderate distention of colon without evidence of colitis or diverticulitis.  Patient denies hematochezia or melena. -MiraLAX daily  Hyperglycemia Blood glucose 151 with ketones in urine on admission.  Patient with A1c of 5.7 in February -Continue to monitor with BMP  HTN BP within goal for age.  Home medications include lisinopril 10, metoprolol 25 twice daily. -Continue home medications  HLD Home medications include atorvastatin 80. -Continue home medications  FEN/GI: Heart healthy diet PPx: Lovenox  Disposition: home after transition to PO abx  Subjective:  Feeling well today. Denies difficulties urinating or abdominal pain. Denies difficulties with  transportation, has daughter that lives locally.  Objective: Temp:  [97.3 F (36.3 C)-98.3 F (36.8 C)] 98 F (36.7 C) (01/01 0500) Pulse Rate:  [73-81] 73 (01/01 0500) Resp:  [17-18] 18 (01/01 0500) BP: (125-153)/(69-87) 153/87 (01/01 0500) SpO2:  [98 %-100 %] 100 % (01/01 0500) Weight:  [61.1 kg] 61.1 kg (12/31 1149) Physical Exam: General: elderly male, lying in bed, in NAD Heart: RRR, no murmur Lungs: CTAB, normal WOB on RA. Abdomen: soft, NTND, +BS Skin: no visible rashes or lesions  Laboratory: Recent Labs  Lab 08/27/19 1152 08/28/19 0526  WBC 12.8* 9.8  HGB 13.0 11.8*  HCT 40.7 36.4*  PLT 488* 271   Recent Labs  Lab 08/27/19 1655 08/28/19 1143  NA 137 139  K 3.6 3.8  CL 101 106  CO2 22 23  BUN 13 10  CREATININE 1.21 0.85  CALCIUM 9.4 8.6*  PROT 8.8*  --   BILITOT 1.5*  --   ALKPHOS 97  --   ALT 22  --   AST 20  --   GLUCOSE 151* 104*   Imaging/Diagnostic Tests: CT CHEST W CONTRAST  Result Date: 08/28/2019 CLINICAL DATA:  Lymphadenopathy. EXAM: CT CHEST WITH CONTRAST TECHNIQUE: Multidetector CT imaging of the chest was performed during intravenous contrast administration. CONTRAST:  26mL OMNIPAQUE IOHEXOL 300 MG/ML  SOLN COMPARISON:  November 16, 2011. FINDINGS: Cardiovascular: There is no large centrally located pulmonary embolism. Detection of smaller pulmonary emboli is limited by study technique and contrast timing. Aortic calcifications are noted. There is no thoracic aortic aneurysm or dissection. No significant pericardial effusion. Mediastinum/Nodes: --No mediastinal or hilar lymphadenopathy. --No axillary lymphadenopathy. --No supraclavicular lymphadenopathy. --Normal thyroid gland. --The esophagus is unremarkable Lungs/Pleura: No pulmonary nodules or masses. No pleural effusion or pneumothorax. No focal airspace consolidation.  No focal pleural abnormality. Upper Abdomen: No acute abnormality. Musculoskeletal: No chest wall abnormality. No acute or  significant osseous findings. 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 The defect in the diaphragm measures approximately 1 cm in length. Review of the MIP images confirms the above findings. IMPRESSION: 1. No acute abnormality. 2. No evidence for metastatic disease within the thorax. Aortic Atherosclerosis (ICD10-I70.0). Electronically Signed   By: Constance Holster M.D.   On: 08/28/2019 15:18    Rory Percy, DO 08/29/2019, 6:31 AM PGY-3, [place] [facility] Intern pager: [phone], text pages welcome

## 2019-08-30 LAB — URINE CULTURE: Culture: >=100000 — AB

## 2019-09-01 ENCOUNTER — Ambulatory Visit (INDEPENDENT_AMBULATORY_CARE_PROVIDER_SITE_OTHER): Payer: Medicare Other | Admitting: Family Medicine

## 2019-09-01 ENCOUNTER — Encounter: Payer: Self-pay | Admitting: Family Medicine

## 2019-09-01 ENCOUNTER — Other Ambulatory Visit: Payer: Self-pay

## 2019-09-01 VITALS — BP 160/60 | HR 85 | Ht 66.0 in | Wt 130.4 lb

## 2019-09-01 DIAGNOSIS — N390 Urinary tract infection, site not specified: Secondary | ICD-10-CM | POA: Diagnosis not present

## 2019-09-01 DIAGNOSIS — I10 Essential (primary) hypertension: Secondary | ICD-10-CM | POA: Diagnosis not present

## 2019-09-01 DIAGNOSIS — Z1211 Encounter for screening for malignant neoplasm of colon: Secondary | ICD-10-CM

## 2019-09-01 DIAGNOSIS — R591 Generalized enlarged lymph nodes: Secondary | ICD-10-CM | POA: Diagnosis not present

## 2019-09-01 DIAGNOSIS — Z0000 Encounter for general adult medical examination without abnormal findings: Secondary | ICD-10-CM

## 2019-09-01 NOTE — Assessment & Plan Note (Signed)
Improved.  Finishing Keflex treatment through 1/6.

## 2019-09-01 NOTE — Assessment & Plan Note (Addendum)
Uncontrolled per clinic blood pressure today, patient expresses concern for whitecoat elevation.  Recommended obtaining a blood pressure cuff at home and measuring his BP once weekly, to keep a journal and bring in follow-up visit.  This was discussed with the patient and his daughter via telephone per patient request.  Continue lisinopril and metoprolol as prescribed for now, may need to increase regimen if consistently elevated at home as well.

## 2019-09-01 NOTE — Progress Notes (Signed)
   Subjective:    Patient ID: Norman Mendez, male    DOB: 05-23-1946, 74 y.o.   MRN: [id number]   CC: Hospital follow-up for UTI/retroperitoneal lymphadenopathy  HPI: [name] is a 74 year old gentleman with hypertension and hyperlipidemia presenting for hospital follow-up.  Hospital follow-up: Admitted from 12/30-1/1 for UTI and further work-up for retroperitoneal lymphadenopathy incidentally noted on CT abdomen in discussion of abdominal pain.  Noted multiple retroperitoneal lymph nodes concerning for metastatic disease, PSA normal.  Chest CT without evidence of enlarged lymph nodes or mass.  IR recommended a lymph node biopsy to be formed outpatient.  For his UTI, received IV ceftriaxone and transitioned to Keflex which will be completed on 1/6.  Today, he is doing well.  Abdominal pain resolved currently.  He has not heard from anyone in terms of biopsy.  He lives alone, his daughter helps when she can.  He makes all of his meals at home.   Health maintenance: He is due for a screening colonoscopy.  He is not interested in this.  Hypertension: Currently managed on lisinopril and metoprolol, endorses compliance.  His blood pressure is elevated in the clinic today, states his blood pressure is always high whenever he is in the office or in the hospital.  Does not have a blood pressure cuff at home.  Denies any chest pain, lightheadedness/dizziness, headache.   Smoking status reviewed  Review of Systems Per HPI    Objective:  BP (!) 160/60   Pulse 85   Ht 5\' 6"  (1.676 m)   Wt 130 lb 6 oz (59.1 kg)   SpO2 98%   BMI 21.04 kg/m  Vitals and nursing note reviewed  General: NAD, pleasant Cardiac: RRR, normal heart sounds Respiratory: CTAB, normal effort Extremities: no edema or cyanosis. WWP. Skin: warm and dry Neuro: alert and oriented, no focal deficits, gait normal uses cane Psych: normal affect  Assessment & Plan:   Lymphadenopathy Retroperitoneal-concerning for  metastatic disease of unknown primary, associated with an approximate 20lb unintentional weight loss in the last several months.  CT chest unremarkable with normal PSA.  Fortunately, several lymph nodes would be amenable to percutaneous biopsy per IR.  Placed referral to IR to get this scheduled.  Essential hypertension Uncontrolled per clinic blood pressure today, patient expresses concern for whitecoat elevation.  Recommended obtaining a blood pressure cuff at home and measuring his BP once weekly, to keep a journal and bring in follow-up visit.  This was discussed with the patient and his daughter via telephone per patient request.  Continue lisinopril and metoprolol as prescribed for now, may need to increase regimen if consistently elevated at home as well.  Complicated UTI (urinary tract infection) Improved.  Finishing Keflex treatment through 1/6.  Healthcare maintenance Discussed options for colon cancer screening, he is amenable to trialing Cologuard.  Order placed.   Follow-up after biopsy or sooner if needed.  His daughter was updated on plan of action via telephone as well.  [place] Resident PGY-2

## 2019-09-01 NOTE — Assessment & Plan Note (Addendum)
Retroperitoneal-concerning for metastatic disease of unknown primary, associated with an approximate 20lb unintentional weight loss in the last several months.  CT chest unremarkable with normal PSA.  Fortunately, several lymph nodes would be amenable to percutaneous biopsy per IR.  Placed referral to IR to get this scheduled.

## 2019-09-01 NOTE — Assessment & Plan Note (Signed)
Discussed options for colon cancer screening, he is amenable to trialing Cologuard.  Order placed.

## 2019-09-01 NOTE — Patient Instructions (Signed)
I will be placing a referral to interventional radiology (IR) to have a biopsy taken of his enlarged lymph nodes.  You should hear a call about getting this scheduled.  If you not hear in the next several weeks, please give our office a call and we will make sure that this is completed.  I additionally placed an order for him to do Cologuard, this is a screening test for colon cancers, a kit will be sent to his home for him to give a stool sample and return back.  Please continue Keflex through 1/6.  I would like him to follow-up after his biopsy.

## 2019-09-02 ENCOUNTER — Encounter: Payer: Self-pay | Admitting: Family Medicine

## 2019-09-29 ENCOUNTER — Encounter (HOSPITAL_COMMUNITY): Payer: Self-pay

## 2019-09-29 ENCOUNTER — Other Ambulatory Visit: Payer: Self-pay

## 2019-09-29 ENCOUNTER — Emergency Department (HOSPITAL_COMMUNITY): Payer: Medicare Other

## 2019-09-29 ENCOUNTER — Emergency Department (HOSPITAL_COMMUNITY)
Admission: EM | Admit: 2019-09-29 | Discharge: 2019-09-29 | Disposition: A | Discharge status: Home / Self Care | Payer: Medicare Other | Attending: Emergency Medicine | Admitting: Emergency Medicine

## 2019-09-29 DIAGNOSIS — Z7982 Long term (current) use of aspirin: Secondary | ICD-10-CM | POA: Insufficient documentation

## 2019-09-29 DIAGNOSIS — Z87891 Personal history of nicotine dependence: Secondary | ICD-10-CM | POA: Diagnosis not present

## 2019-09-29 DIAGNOSIS — I10 Essential (primary) hypertension: Secondary | ICD-10-CM | POA: Diagnosis not present

## 2019-09-29 DIAGNOSIS — N4889 Other specified disorders of penis: Secondary | ICD-10-CM | POA: Insufficient documentation

## 2019-09-29 DIAGNOSIS — N3001 Acute cystitis with hematuria: Secondary | ICD-10-CM | POA: Insufficient documentation

## 2019-09-29 DIAGNOSIS — Z79899 Other long term (current) drug therapy: Secondary | ICD-10-CM | POA: Diagnosis not present

## 2019-09-29 DIAGNOSIS — N489 Disorder of penis, unspecified: Secondary | ICD-10-CM

## 2019-09-29 LAB — CBC
HCT: 37.2 % — ABNORMAL LOW (ref 39.0–52.0)
Hemoglobin: 11.5 g/dL — ABNORMAL LOW (ref 13.0–17.0)
MCH: 27.8 pg (ref 26.0–34.0)
MCHC: 30.9 g/dL (ref 30.0–36.0)
MCV: 90.1 fL (ref 80.0–100.0)
Platelets: 263 10*3/uL (ref 150–400)
RBC: 4.13 MIL/uL — ABNORMAL LOW (ref 4.22–5.81)
RDW: 13.6 % (ref 11.5–15.5)
WBC: 6.4 10*3/uL (ref 4.0–10.5)
nRBC: 0 % (ref 0.0–0.2)

## 2019-09-29 LAB — BASIC METABOLIC PANEL
Anion gap: 11 (ref 5–15)
BUN: 6 mg/dL — ABNORMAL LOW (ref 8–23)
CO2: 25 mmol/L (ref 22–32)
Calcium: 8.8 mg/dL — ABNORMAL LOW (ref 8.9–10.3)
Chloride: 106 mmol/L (ref 98–111)
Creatinine, Ser: 0.95 mg/dL (ref 0.61–1.24)
GFR calc Af Amer: >60 mL/min (ref >60)
GFR calc non Af Amer: >60 mL/min (ref >60)
Glucose, Bld: 116 mg/dL — ABNORMAL HIGH (ref 70–99)
Potassium: 3.3 mmol/L — ABNORMAL LOW (ref 3.5–5.1)
Sodium: 142 mmol/L (ref 135–145)

## 2019-09-29 LAB — URINALYSIS, ROUTINE W REFLEX MICROSCOPIC
Bilirubin Urine: NEGATIVE
Glucose, UA: NEGATIVE mg/dL
Ketones, ur: NEGATIVE mg/dL
Nitrite: NEGATIVE
Protein, ur: 100 mg/dL — AB
RBC / HPF: >50 RBC/hpf — ABNORMAL HIGH (ref 0–5)
Specific Gravity, Urine: 1.02 (ref 1.005–1.030)
pH: 7 (ref 5.0–8.0)

## 2019-09-29 MED ORDER — CLOTRIMAZOLE 1 % EX CREA: TOPICAL_CREAM | CUTANEOUS | 1 refills | Status: DC

## 2019-09-29 MED ORDER — CEPHALEXIN 500 MG PO CAPS: 1000.0000 mg | ORAL_CAPSULE | Freq: Two times a day (BID) | ORAL | 0 refills | Status: DC

## 2019-09-29 NOTE — ED Triage Notes (Signed)
Pt states "I have a problem down there, It feels like there is a bone stuck". Pt denies penile discharge or hematuria. Denies swelling or redness to the area.

## 2019-09-29 NOTE — ED Provider Notes (Addendum)
[facility] EMERGENCY DEPARTMENT Provider Note   CSN: [id number] Arrival date & time: 09/29/19  [id number]     History No chief complaint on file.   Norman Mendez is a 74 y.o. male.  HPI Patient reports he started to get pain in his penis.  It happens mostly at nighttime.  He reports it feels like the penis is getting a "bone in it".  No difficulty urinating.  He denies pain with urination.  He has not seen any blood in the urine.  He denies any visible swelling or redness.  He denies any abdominal pain or back pain.  He reports he was hospitalized last month for back pain but it has resolved and that is not an issue right now.  He denies history of similar problem.  He does not use any erectile dysfunction medications.    Past Medical History:  Diagnosis Date  . CVA (cerebral infarction)   . Hyperlipemia   . Hypertension   . NSTEMI (non-ST elevated myocardial infarction) ([place])   . Shortness of breath dyspnea   . Sinus tachycardia 11/16/2011    Patient Active Problem List   Diagnosis Date Noted  . Healthcare maintenance 09/01/2019  . Complicated UTI (urinary tract infection) 08/28/2019  . Lymphadenopathy   . Acute UTI 08/27/2019  . Fall at home 02/14/2019  . Premature atrial beats 09/17/2018  . 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 Mild cognitive impairment 10/21/2014  . Dysarthria due to cerebrovascular accident   . Cerebral thrombosis with cerebral infarction ([place]) 10/20/2014  . Essential hypertension 09/11/2014  . Hyperlipidemia 03/18/2014    Past Surgical History:  Procedure Laterality Date  . LEFT HEART CATHETERIZATION WITH CORONARY ANGIOGRAM N/A 03/18/2014   Procedure: LEFT HEART CATHETERIZATION WITH CORONARY ANGIOGRAM;  Surgeon: Sinclair Grooms, MD;  Location: [facility] CATH LAB;  Service: Cardiovascular;  Laterality: N/A;  . No previous surgery         Family History  Problem Relation Age of Onset  . Heart disease  Other        No family history    Social History   Tobacco Use  . Smoking status: Former Smoker    Packs/day: 0.20    Types: Cigarettes    Quit date: 08/18/2014    Years since quitting: 5.1  . Smokeless tobacco: Never Used  Substance Use Topics  . Alcohol use: No    Comment: 3 40 ounce beer per week  . Drug use: No    Home Medications Prior to Admission medications   Medication Sig Start Date End Date Taking? Authorizing Provider  aspirin 81 MG chewable tablet Chew 1 tablet (81 mg total) by mouth daily. 12/31/18  Yes Bufford Lope, DO  atorvastatin (LIPITOR) 80 MG tablet Take 1 tablet (80 mg total) by mouth daily at 6 PM. 12/31/18  Yes Orson Eva J, DO  lisinopril (ZESTRIL) 10 MG tablet Take 1 tablet (10 mg total) by mouth daily. 12/31/18  Yes Orson Eva J, DO  metoprolol tartrate (LOPRESSOR) 25 MG tablet Take 1 tablet (25 mg total) by mouth 2 (two) times daily. 06/19/19  Yes Patriciaann Clan, DO  nitroGLYCERIN (NITROSTAT) 0.4 MG SL tablet Place 1 tablet (0.4 mg total) under the tongue every 5 (five) minutes x 3 doses as needed for chest pain. 03/20/14  Yes Brett Canales, PA-C  polyethylene glycol (MIRALAX / GLYCOLAX) 17 g packet Take 17 g by mouth daily. Patient taking differently: Take 17 g  by mouth daily as needed for mild constipation.  08/30/19  Yes Rory Percy, DO  cephALEXin (KEFLEX) 500 MG capsule Take 2 capsules (1,000 mg total) by mouth 2 (two) times daily. 09/29/19   Charlesetta Shanks, MD  clotrimazole (LOTRIMIN) 1 % cream Apply to affected area 2 times daily 7-14 days 09/29/19   Charlesetta Shanks, MD    Allergies    Patient has no known allergies.  Review of Systems   Review of Systems 10 Systems reviewed and are negative for acute change except as noted in the HPI. Physical Exam Updated Vital Signs BP (!) 171/77 (BP Location: Right Arm)   Pulse (!) 103   Temp 97.7 F (36.5 C) (Axillary)   Resp 20   SpO2 100%   Physical Exam Constitutional:      Comments: Patient is  alert.  Well in appearance.  Nontoxic.  No respiratory distress.  Well-nourished well-developed.  Pulmonary:     Effort: Pulmonary effort is normal.  Abdominal:     General: There is no distension.     Palpations: Abdomen is soft.     Tenderness: There is no abdominal tenderness. There is no guarding.  Genitourinary:    Comments: Penis is uncircumcised.  Retracted foreskin shows some minor balanitis.  Urethra has no drainage.  Palpation, patient does have firm, tenderness or calcified quality of the glands and distal portion of the penis.  Foreskin retracts easily.  Testicle smooth and nontender.  No scrotal edema. Musculoskeletal:        General: No swelling or tenderness. Normal range of motion.  Skin:    General: Skin is warm and dry.  Neurological:     Mental Status: He is oriented to person, place, and time.     Coordination: Coordination normal.  Psychiatric:        Mood and Affect: Mood normal.           ED Results / Procedures / Treatments   Labs (all labs ordered are listed, but only abnormal results are displayed) Labs Reviewed  URINALYSIS, ROUTINE W REFLEX MICROSCOPIC - Abnormal; Notable for the following components:      Result Value   APPearance CLOUDY (*)    Hgb urine dipstick MODERATE (*)    Protein, ur 100 (*)    Leukocytes,Ua SMALL (*)    RBC / HPF >50 (*)    Bacteria, UA FEW (*)    All other components within normal limits  BASIC METABOLIC PANEL - Abnormal; Notable for the following components:   Potassium 3.3 (*)    Glucose, Bld 116 (*)    BUN 6 (*)    Calcium 8.8 (*)    All other components within normal limits  CBC - Abnormal; Notable for the following components:   RBC 4.13 (*)    Hemoglobin 11.5 (*)    HCT 37.2 (*)    All other components within normal limits  URINE CULTURE    EKG None  Radiology DG Pelvis Portable  Result Date: 09/29/2019 CLINICAL DATA:  Penile pain. EXAM: PORTABLE PELVIS 1-2 VIEWS COMPARISON:  CT scan of the pelvis  dated 08/27/2019 and radiograph dated 12/05/2004 FINDINGS: There are small chronic calcifications in the bases of the corpus cavernosa bilaterally, present on the radiograph from 2006, and unchanged. No other visible penile calcifications. There are minimal arthritic changes of both hips. No other significant abnormalities. 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 These are not significant. Minimal arthritic  changes of both hips. No other significant abnormality of the pelvis. Electronically Signed   By: Lorriane Shire M.D.   On: 09/29/2019 08:38    Procedures Procedures (including critical care time)  Medications Ordered in ED Medications - No data to display  ED Course  I have reviewed the triage vital signs and the nursing notes.  Pertinent labs & imaging results that were available during my care of the patient were reviewed by me and considered in my medical decision making (see chart for details).    MDM Rules/Calculators/A&P                      Consult: Reviewed with [place] Reader pace of urology.  Advised to give the patient contact information for ED follow-up and they will follow up on him.  This appears to be a chronic problem.  We will treat for minor balanitis with Diflucan.  No difficulty urinating.  Unclear at this time if calcification or fibrotic disorder present.  Patient's CT done on last evaluation showed intra-abdominal lymphadenopathy with concern for metastatic disease.  Possibly this is some penile cancer.  Urinalysis is suggestive of UTI.  Last urine culture showed mixed flora.  Will treat with Keflex empirically and reculture.  No signs of pyelonephritis or general constitutional symptoms.  Patient is stable for outpatient follow-up.  Return precautions reviewed.  Consultation has been placed with urology.  They are aware of the patient and plan for follow-up. Final Clinical Impression(s) / ED  Diagnoses Final diagnoses:  Penis disorder  Acute cystitis with hematuria    Rx / DC Orders ED Discharge Orders         Ordered    clotrimazole (LOTRIMIN) 1 % cream     09/29/19 0957    cephALEXin (KEFLEX) 500 MG capsule  2 times daily     09/29/19 0957           Charlesetta Shanks, MD 09/29/19 [id number]    Charlesetta Shanks, MD 09/29/19 1001

## 2019-09-29 NOTE — Discharge Instructions (Signed)
1.  Call [facility] to schedule a follow-up appointment within the week. 2.  Start Keflex and Chlortrimazole cream as prescribed. 3.  Return to the emergency department if you develop fevers, abdominal pain, difficulty or inability to urinate, worsening pain or swelling of the penis or other concerns.

## 2019-10-01 LAB — URINE CULTURE: Culture: >=100000 — AB

## 2019-10-02 ENCOUNTER — Telehealth: Payer: Self-pay

## 2019-10-02 NOTE — Telephone Encounter (Signed)
Post ED Visit - Positive Culture Follow-up  Culture report reviewed by antimicrobial stewardship pharmacist: [place] Team []  Elenor Quinones, Pharm.D. []  Heide Guile, Pharm.D., BCPS AQ-ID []  Parks Neptune, Pharm.D., BCPS []  Alycia Rossetti, Pharm.D., BCPS []  [place], [place].D., BCPS, AAHIVP []  Legrand Como, Pharm.D., BCPS, AAHIVP [x]  Salome Arnt, PharmD, BCPS []  Johnnette Gourd, PharmD, BCPS []  Hughes Better, PharmD, BCPS []  Leeroy Cha, PharmD []  Laqueta Linden, PharmD, BCPS []  Albertina Parr, PharmD  [place] Team []  Leodis Sias, PharmD []  Lindell Spar, PharmD []  Royetta Asal, PharmD []  Graylin Shiver, Rph []  Rema Fendt) [place] Mac, PharmD []  Arlyn Dunning, PharmD []  Netta Cedars, PharmD []  Dia Sitter, PharmD []  Leone Haven, PharmD []  Gretta Arab, PharmD []  Theodis Shove, PharmD []  Peggyann Juba, PharmD []  Reuel Boom, PharmD   Positive urine culture Treated with Cephalexin, organism sensitive to the same and no further patient follow-up is required at this time.  [name] 10/02/2019, 8:47 AM

## 2019-10-14 ENCOUNTER — Other Ambulatory Visit: Payer: Self-pay | Admitting: Urology

## 2019-10-14 DIAGNOSIS — R102 Pelvic and perineal pain: Secondary | ICD-10-CM

## 2019-10-27 ENCOUNTER — Ambulatory Visit (HOSPITAL_COMMUNITY): Admission: RE | Admit: 2019-10-27 | Payer: Medicare Other | Source: Ambulatory Visit

## 2019-11-05 ENCOUNTER — Other Ambulatory Visit: Payer: Self-pay

## 2019-11-05 ENCOUNTER — Encounter (HOSPITAL_COMMUNITY): Payer: Self-pay

## 2019-11-05 ENCOUNTER — Ambulatory Visit (HOSPITAL_COMMUNITY)
Admission: RE | Admit: 2019-11-05 | Discharge: 2019-11-05 | Disposition: A | Discharge status: Home / Self Care | Payer: Medicare Other | Source: Ambulatory Visit | Attending: Urology | Admitting: Urology

## 2019-11-05 DIAGNOSIS — R102 Pelvic and perineal pain: Secondary | ICD-10-CM

## 2019-11-05 NOTE — Progress Notes (Signed)
Pt came for MRI of the pelvis. Pt was in too much pain to preform MRI pelvis attention to the penis. Pt constantly moving around. Pt pulled out twice and attempted to make comfortable. Pt states its the constant pain in his penis that's not allowing him to hold still. Calling MD office to notify of pt's inability to hold still after multiple attempts for his scan. Pt does seem to have some slight confusion. Pt caregivers states he has been falling lately.

## 2019-11-05 NOTE — Progress Notes (Signed)
Triage RN notified of previous not by Ty Hilts 11/05/19 @1 :43pm

## 2019-11-06 ENCOUNTER — Other Ambulatory Visit (HOSPITAL_COMMUNITY): Payer: Self-pay | Admitting: Family Medicine

## 2019-11-06 ENCOUNTER — Telehealth: Payer: Self-pay | Admitting: Family Medicine

## 2019-11-06 DIAGNOSIS — R591 Generalized enlarged lymph nodes: Secondary | ICD-10-CM

## 2019-11-06 NOTE — Telephone Encounter (Signed)
Noted patient had not heard from IR yet after referral in 08/2019. Spoke with Anderson Malta from [facility] IR team who will reaching out to the patient/daughter promptly. He additionally has an appt with myself of 3/17, will make sure he has appointments scheduled as needed.   Patriciaann Clan, DO

## 2019-11-07 ENCOUNTER — Observation Stay (HOSPITAL_COMMUNITY): Payer: Medicare Other

## 2019-11-07 ENCOUNTER — Encounter (HOSPITAL_COMMUNITY): Payer: Self-pay | Admitting: Emergency Medicine

## 2019-11-07 ENCOUNTER — Other Ambulatory Visit: Payer: Self-pay

## 2019-11-07 ENCOUNTER — Inpatient Hospital Stay (HOSPITAL_COMMUNITY)
Admission: EM | Admit: 2019-11-07 | Discharge: 2019-11-11 | DRG: 716 | Disposition: A | Discharge status: Home / Self Care | Payer: Medicare Other | Attending: Family Medicine | Admitting: Family Medicine

## 2019-11-07 DIAGNOSIS — I10 Essential (primary) hypertension: Secondary | ICD-10-CM | POA: Diagnosis present

## 2019-11-07 DIAGNOSIS — Z7982 Long term (current) use of aspirin: Secondary | ICD-10-CM

## 2019-11-07 DIAGNOSIS — E785 Hyperlipidemia, unspecified: Secondary | ICD-10-CM | POA: Diagnosis present

## 2019-11-07 DIAGNOSIS — R591 Generalized enlarged lymph nodes: Secondary | ICD-10-CM

## 2019-11-07 DIAGNOSIS — R590 Localized enlarged lymph nodes: Secondary | ICD-10-CM

## 2019-11-07 DIAGNOSIS — C609 Malignant neoplasm of penis, unspecified: Principal | ICD-10-CM | POA: Diagnosis present

## 2019-11-07 DIAGNOSIS — I69322 Dysarthria following cerebral infarction: Secondary | ICD-10-CM

## 2019-11-07 DIAGNOSIS — R4189 Other symptoms and signs involving cognitive functions and awareness: Secondary | ICD-10-CM | POA: Diagnosis present

## 2019-11-07 DIAGNOSIS — N486 Induration penis plastica: Secondary | ICD-10-CM | POA: Diagnosis not present

## 2019-11-07 DIAGNOSIS — G3184 Mild cognitive impairment, so stated: Secondary | ICD-10-CM | POA: Diagnosis present

## 2019-11-07 DIAGNOSIS — Z720 Tobacco use: Secondary | ICD-10-CM

## 2019-11-07 DIAGNOSIS — N368 Other specified disorders of urethra: Secondary | ICD-10-CM | POA: Diagnosis present

## 2019-11-07 DIAGNOSIS — M6740 Ganglion, unspecified site: Secondary | ICD-10-CM | POA: Diagnosis present

## 2019-11-07 DIAGNOSIS — D649 Anemia, unspecified: Secondary | ICD-10-CM | POA: Diagnosis present

## 2019-11-07 DIAGNOSIS — Z87440 Personal history of urinary (tract) infections: Secondary | ICD-10-CM

## 2019-11-07 DIAGNOSIS — Z20822 Contact with and (suspected) exposure to covid-19: Secondary | ICD-10-CM | POA: Diagnosis present

## 2019-11-07 DIAGNOSIS — K5900 Constipation, unspecified: Secondary | ICD-10-CM | POA: Diagnosis present

## 2019-11-07 DIAGNOSIS — Z79899 Other long term (current) drug therapy: Secondary | ICD-10-CM

## 2019-11-07 DIAGNOSIS — I252 Old myocardial infarction: Secondary | ICD-10-CM

## 2019-11-07 DIAGNOSIS — N4889 Other specified disorders of penis: Secondary | ICD-10-CM

## 2019-11-07 HISTORY — DX: Generalized enlarged lymph nodes: R59.1

## 2019-11-07 HISTORY — DX: Reserved for concepts with insufficient information to code with codable children: IMO0002

## 2019-11-07 HISTORY — DX: Other specified disorders of penis: N48.89

## 2019-11-07 LAB — BASIC METABOLIC PANEL
Anion gap: 13 (ref 5–15)
BUN: 10 mg/dL (ref 8–23)
CO2: 23 mmol/L (ref 22–32)
Calcium: 9.5 mg/dL (ref 8.9–10.3)
Chloride: 106 mmol/L (ref 98–111)
Creatinine, Ser: 1.11 mg/dL (ref 0.61–1.24)
GFR calc Af Amer: >60 mL/min (ref >60)
GFR calc non Af Amer: >60 mL/min (ref >60)
Glucose, Bld: 109 mg/dL — ABNORMAL HIGH (ref 70–99)
Potassium: 5.1 mmol/L (ref 3.5–5.1)
Sodium: 142 mmol/L (ref 135–145)

## 2019-11-07 LAB — URINALYSIS, ROUTINE W REFLEX MICROSCOPIC
Bilirubin Urine: NEGATIVE
Glucose, UA: NEGATIVE mg/dL
Ketones, ur: NEGATIVE mg/dL
Nitrite: NEGATIVE
Protein, ur: NEGATIVE mg/dL
Specific Gravity, Urine: 1.017 (ref 1.005–1.030)
pH: 6 (ref 5.0–8.0)

## 2019-11-07 LAB — CBC
HCT: 41.2 % (ref 39.0–52.0)
Hemoglobin: 12.6 g/dL — ABNORMAL LOW (ref 13.0–17.0)
MCH: 27.9 pg (ref 26.0–34.0)
MCHC: 30.6 g/dL (ref 30.0–36.0)
MCV: 91.4 fL (ref 80.0–100.0)
Platelets: 361 10*3/uL (ref 150–400)
RBC: 4.51 MIL/uL (ref 4.22–5.81)
RDW: 14.8 % (ref 11.5–15.5)
WBC: 9 10*3/uL (ref 4.0–10.5)
nRBC: 0 % (ref 0.0–0.2)

## 2019-11-07 LAB — RESPIRATORY PANEL BY RT PCR (FLU A&B, COVID)
Influenza A by PCR: NEGATIVE
Influenza B by PCR: NEGATIVE
SARS Coronavirus 2 by RT PCR: NEGATIVE

## 2019-11-07 MED ORDER — METOPROLOL TARTRATE 25 MG PO TABS
25.0000 mg | ORAL_TABLET | Freq: Two times a day (BID) | ORAL | Status: DC
  Administered 2019-11-07 – 2019-11-11 (×9): 25 mg via ORAL
  Filled 2019-11-07 (×9): qty 1

## 2019-11-07 MED ORDER — FENTANYL CITRATE (PF) 100 MCG/2ML IJ SOLN
12.5000 ug | Freq: Once | INTRAMUSCULAR | Status: AC
  Administered 2019-11-07: 12.5 ug via INTRAVENOUS
  Filled 2019-11-07: qty 2

## 2019-11-07 MED ORDER — OXYCODONE HCL 5 MG PO TABS
5.0000 mg | ORAL_TABLET | Freq: Four times a day (QID) | ORAL | Status: DC | PRN
  Administered 2019-11-07 – 2019-11-08 (×2): 5 mg via ORAL
  Filled 2019-11-07 (×2): qty 1

## 2019-11-07 MED ORDER — LISINOPRIL 10 MG PO TABS
10.0000 mg | ORAL_TABLET | Freq: Every day | ORAL | Status: DC
  Administered 2019-11-07 – 2019-11-11 (×5): 10 mg via ORAL
  Filled 2019-11-07 (×5): qty 1

## 2019-11-07 MED ORDER — ACETAMINOPHEN 325 MG PO TABS
650.0000 mg | ORAL_TABLET | Freq: Four times a day (QID) | ORAL | Status: DC | PRN
  Administered 2019-11-07: 650 mg via ORAL
  Filled 2019-11-07 (×2): qty 2

## 2019-11-07 MED ORDER — ACETAMINOPHEN 650 MG RE SUPP: 650.0000 mg | Freq: Four times a day (QID) | RECTAL | Status: DC | PRN

## 2019-11-07 MED ORDER — ENOXAPARIN SODIUM 40 MG/0.4ML ~~LOC~~ SOLN
40.0000 mg | SUBCUTANEOUS | Status: DC
  Administered 2019-11-07 – 2019-11-09 (×3): 40 mg via SUBCUTANEOUS
  Filled 2019-11-07 (×3): qty 0.4

## 2019-11-07 MED ORDER — ADULT MULTIVITAMIN W/MINERALS CH
1.0000 | ORAL_TABLET | Freq: Every day | ORAL | Status: DC
  Administered 2019-11-07 – 2019-11-11 (×5): 1 via ORAL
  Filled 2019-11-07 (×5): qty 1

## 2019-11-07 MED ORDER — ASPIRIN 81 MG PO CHEW
81.0000 mg | CHEWABLE_TABLET | Freq: Every day | ORAL | Status: DC
  Administered 2019-11-07 – 2019-11-11 (×5): 81 mg via ORAL
  Filled 2019-11-07 (×5): qty 1

## 2019-11-07 NOTE — ED Triage Notes (Signed)
Pt endorses penis pain for a week. Has been seen for this and sent to a urologist.

## 2019-11-07 NOTE — H&P (Signed)
[facility] Admission History and Physical Service Pager: [phone]  Patient name: Halo Wiltfong Medical record number: [id number] Date of birth: 1945-09-19 Age: 74 y.o. Gender: male  Primary Care Provider: Patriciaann Clan, DO Consultants: Urology Code Status: full Preferred Emergency Contact: [name] (daughter) [phone]  Chief Complaint: Penile pain  Assessment and Plan: Buddie Sulcer is a 74 y.o. male presenting with chronic penile pain. PMH is significant for hypertension, HLD, mild cognitive impairment.  Penile pain thought to be malignancy related-admitted 12/30 through 1/1 for UTI and was found to have retroperitoneal lymphadenopathy on abdominal CT which were concerning for metastatic disease.  Chest CT negative.  IR recommended lymph node biopsy to be performed outpatient.  This was not completed due to poor follow-up for multiple reasons.  MRI was attempted as outpatient but patient was not able to lay still for imaging due to pain. Dr. Claudia Desanctis from urology was consulted from ED and per report recommends MRI and IR biopsy, planning to see patient today or tomorrow. Patient is currently complaining of 10/10 pain worse in glans.  -Admit to MedSurg, attending Dr. McDiarmid - f/u urinalysis -MRI of pelvis with and without contrast -Fentanyl one-time dose for pain control with imaging -Tylenol and oxycodone as needed for pain -Follow-up with urology today or tomorrow -May need IR consult for biopsy - strict I/O  Cognitive impairment-A&O x4 -Delirium precautions  HTN-hypertensive since admission (165/93-171/82) and had not taken morning blood pressure medicine.  Will hopefully normalize once back on home medication and pain is better controlled.  Home med: Metoprolol, lisinopril.  Patient states that he needs refill of his metoprolol and that is the only medication he typically takes. -Continue metoprolol -Vitals per floor protocol  Normocytic  anemia-hemoglobin 12.6 on admission.  Denies blood in urine or stool. -CBC a.m.  HLD-last lipid profile in chart from 2016 -Recommend lipid profile outpatient  FEN/GI: Heart healthy diet Prophylaxis: Lovenox  Disposition: Pending urology work-up  History of Present Illness:  Ryelin Chaparro is a 74 y.o. male presenting with continued penile pain which is not controlled at home.  Comes from his home in a hotel by bus today. Endorses hardness of penis shaft like there is a bone in it as well as swelling of surrounding tissues at the base.  Denies any difficulty with urination or dysuria and has no discharge from the urethra.  Pain is localized to the glans primarily.  Rates as 8 or 9 out of 10.  Endorses some urinary incontinence.  Has not tried to take anything to help the pain thus far.  Has not taken his medications today. Attempted MRI at [facility] but was unable to sit still due to the pain. Denies cough, fever, other complaints.  Denies smoking or drugs or alcohol.    Review Of Systems: Per HPI with the following additions:   Review of Systems  Constitutional: Negative for fever and weight loss.  Respiratory: Negative for cough.   Gastrointestinal: Negative for abdominal pain, constipation, diarrhea, nausea and vomiting.  Genitourinary: Positive for urgency. Negative for dysuria and flank pain.  Musculoskeletal: Negative for joint pain.  Skin: Negative for rash.  Neurological: Negative for weakness.    Patient Active Problem List   Diagnosis Date Noted  . Retroperitoneal lymphadenopathy 11/07/2019  . Healthcare maintenance 09/01/2019  . Complicated UTI (urinary tract infection) 08/28/2019  . Lymphadenopathy   . Acute UTI 08/27/2019  . Fall at home 02/14/2019  . Premature atrial beats 09/17/2018  . 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 Mild cognitive impairment 10/21/2014  . Dysarthria due to cerebrovascular accident   . 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 Essential hypertension 09/11/2014  . Hyperlipidemia 03/18/2014    Past Medical History: Past Medical History:  Diagnosis Date  . CVA (cerebral infarction)   . Hyperlipemia   . Hypertension   . NSTEMI (non-ST elevated myocardial infarction) ([place])   . Shortness of breath dyspnea   . Sinus tachycardia 11/16/2011    Past Surgical History: Past Surgical History:  Procedure Laterality Date  . LEFT HEART CATHETERIZATION WITH CORONARY ANGIOGRAM N/A 03/18/2014   Procedure: LEFT HEART CATHETERIZATION WITH CORONARY ANGIOGRAM;  Surgeon: Sinclair Grooms, MD;  Location: [facility] CATH LAB;  Service: Cardiovascular;  Laterality: N/A;  . No previous surgery      Social History: Social History   Tobacco Use  . Smoking status: Former Smoker    Packs/day: 0.20    Types: Cigarettes    Quit date: 08/18/2014    Years since quitting: 5.2  . Smokeless tobacco: Never Used  Substance Use Topics  . Alcohol use: No    Comment: 3 40 ounce beer per week  . Drug use: No   Additional social history: lives in hotel independently.  Please also refer to relevant sections of EMR.  Family History: Family History  Problem Relation Age of Onset  . Heart disease Other        No family history   Allergies and Medications: No Known Allergies No current facility-administered medications on file prior to encounter.   Current Outpatient Medications on File Prior to Encounter  Medication Sig Dispense Refill  . aspirin 81 MG chewable tablet Chew 1 tablet (81 mg total) by mouth daily. 90 tablet 3  . atorvastatin (LIPITOR) 80 MG tablet Take 1 tablet (80 mg total) by mouth daily at 6 PM. 90 tablet 3  . cephALEXin (KEFLEX) 500 MG capsule Take 2 capsules (1,000 mg total) by mouth 2 (two) times daily. 28 capsule 0  . clotrimazole (LOTRIMIN) 1 % cream Apply to affected area 2 times daily 7-14 days 15 g 1  . lisinopril (ZESTRIL) 10 MG tablet Take 1 tablet (10 mg  total) by mouth daily. 90 tablet 3  . metoprolol tartrate (LOPRESSOR) 25 MG tablet Take 1 tablet (25 mg total) by mouth 2 (two) times daily. 180 tablet 1  . nitroGLYCERIN (NITROSTAT) 0.4 MG SL tablet Place 1 tablet (0.4 mg total) under the tongue every 5 (five) minutes x 3 doses as needed for chest pain. 25 tablet 12  . polyethylene glycol (MIRALAX / GLYCOLAX) 17 g packet Take 17 g by mouth daily. (Patient taking differently: Take 17 g by mouth daily as needed for mild constipation. ) 14 each 0    Objective: BP (!) 170/80   Pulse 81   Temp 97.6 F (36.4 C) (Oral)   Resp 16   Ht 5\' 6"  (1.676 m)   Wt 59 kg   SpO2 100%   BMI 20.98 kg/m   Exam: General: well appearing, frail man  Eyes: Open and tracking appropriately ENTM: Moist mucous membranes, poor dentition with subglossal deformity Neck: Soft, no thyromegaly or tenderness Cardiovascular: Regular rate and rhythm, no murmur, distal pulses intact, negative lower extremity edema Respiratory: Clear bilaterally, no increased work of breathing Gastrointestinal: Abdomen scaphoid, nontender, normal bowel sounds MSK: Lymphadenitis inguinal right worse than left with not erythematous edema Derm: No rashes or lesions GU: Uncircumcised  penis, firm, tender to palpation of glans, clear urethral discharge Neuro: Alert and oriented x4, no obvious deficits Psych: Normal mood and affect  Labs and Imaging: CBC BMET  Recent Labs  Lab 11/07/19 1035  WBC 9.0  HGB 12.6*  HCT 41.2  PLT 361   Recent Labs  Lab 11/07/19 1035  NA 142  K 5.1  CL 106  CO2 23  BUN 10  CREATININE 1.11  GLUCOSE 109*  CALCIUM 9.5      No results found.   Richarda Osmond, DO 11/07/2019, 1:26 PM PGY-2, [place] Intern pager: [phone], text pages welcome

## 2019-11-07 NOTE — ED Notes (Signed)
Attempted to call report

## 2019-11-07 NOTE — ED Provider Notes (Signed)
[facility] EMERGENCY DEPARTMENT Provider Note   CSN: [id number] Arrival date & time: 11/07/19  1016     History Chief Complaint  Patient presents with  . Penis Pain    Norman Mendez is a 74 y.o. male.  HPI Patient presents with penile pain.  Has been having for a few months now.  Has been seen by urology and PCP.  Also seen in the ER.  Has seen Dr. Claudia Desanctis from urology and discussed with her.  Has tenderness and swelling of the head of the penis and along the shaft.  Also known retroperitoneal lymphadenopathy presumed to be malignancy.  However has not been able to get the IR guided sampling of the nodes or was not able to lay still enough to get the MRI of the pelvis.  Continued pain.  Unrelieved at home.  Still able to urinate.  Reviewing records does have a history of mild cognitive impairment.    Past Medical History:  Diagnosis Date  . CVA (cerebral infarction)   . Hyperlipemia   . Hypertension   . NSTEMI (non-ST elevated myocardial infarction) ([place])   . Shortness of breath dyspnea   . Sinus tachycardia 11/16/2011    Patient Active Problem List   Diagnosis Date Noted  . Healthcare maintenance 09/01/2019  . Complicated UTI (urinary tract infection) 08/28/2019  . Lymphadenopathy   . Acute UTI 08/27/2019  . Fall at home 02/14/2019  . Premature atrial beats 09/17/2018  . 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 Mild cognitive impairment 10/21/2014  . Dysarthria due to cerebrovascular accident   . Cerebral thrombosis with cerebral infarction ([place]) 10/20/2014  . Essential hypertension 09/11/2014  . Hyperlipidemia 03/18/2014    Past Surgical History:  Procedure Laterality Date  . LEFT HEART CATHETERIZATION WITH CORONARY ANGIOGRAM N/A 03/18/2014   Procedure: LEFT HEART CATHETERIZATION WITH CORONARY ANGIOGRAM;  Surgeon: Sinclair Grooms, MD;  Location: [facility] CATH LAB;  Service: Cardiovascular;  Laterality: N/A;  . No previous  surgery         Family History  Problem Relation Age of Onset  . Heart disease Other        No family history    Social History   Tobacco Use  . Smoking status: Former Smoker    Packs/day: 0.20    Types: Cigarettes    Quit date: 08/18/2014    Years since quitting: 5.2  . Smokeless tobacco: Never Used  Substance Use Topics  . Alcohol use: No    Comment: 3 40 ounce beer per week  . Drug use: No    Home Medications Prior to Admission medications   Medication Sig Start Date End Date Taking? Authorizing Provider  aspirin 81 MG chewable tablet Chew 1 tablet (81 mg total) by mouth daily. 12/31/18   Bufford Lope, DO  atorvastatin (LIPITOR) 80 MG tablet Take 1 tablet (80 mg total) by mouth daily at 6 PM. 12/31/18   Bufford Lope, DO  cephALEXin (KEFLEX) 500 MG capsule Take 2 capsules (1,000 mg total) by mouth 2 (two) times daily. 09/29/19   Charlesetta Shanks, MD  clotrimazole (LOTRIMIN) 1 % cream Apply to affected area 2 times daily 7-14 days 09/29/19   Charlesetta Shanks, MD  lisinopril (ZESTRIL) 10 MG tablet Take 1 tablet (10 mg total) by mouth daily. 12/31/18   Bufford Lope, DO  metoprolol tartrate (LOPRESSOR) 25 MG tablet Take 1 tablet (25 mg total) by mouth 2 (two) times daily. 06/19/19  Patriciaann Clan, DO  nitroGLYCERIN (NITROSTAT) 0.4 MG SL tablet Place 1 tablet (0.4 mg total) under the tongue every 5 (five) minutes x 3 doses as needed for chest pain. 03/20/14   Brett Canales, PA-C  polyethylene glycol (MIRALAX / GLYCOLAX) 17 g packet Take 17 g by mouth daily. Patient taking differently: Take 17 g by mouth daily as needed for mild constipation.  08/30/19   Rory Percy, DO    Allergies    Patient has no known allergies.  Review of Systems   Review of Systems  Constitutional: Positive for unexpected weight change. Negative for appetite change.  HENT: Negative for congestion.   Cardiovascular: Negative for chest pain.  Gastrointestinal: Negative for abdominal pain.  Genitourinary:  Positive for penile pain. Negative for difficulty urinating.  Musculoskeletal: Negative for back pain.  Skin: Negative for wound.  Neurological: Negative for weakness.  Hematological: Positive for adenopathy.  Psychiatric/Behavioral: Negative for confusion.    Physical Exam Updated Vital Signs BP (!) 165/93 (BP Location: Right Arm)   Pulse 92   Temp 97.6 F (36.4 C) (Oral)   Resp 16   Ht 5\' 6"  (1.676 m)   Wt 59 kg   SpO2 100%   BMI 20.98 kg/m   Physical Exam Vitals and nursing note reviewed.  HENT:     Head: Normocephalic.  Pulmonary:     Breath sounds: No wheezing or rhonchi.  Abdominal:     Tenderness: There is no abdominal tenderness.  Genitourinary:    Comments: Tenderness and firmness of the head of the penis.  Mild penile discharge.  Also tenderness approximately 2 cm long on the right side proximal shaft of the penis.  Patient is uncircumcised. Musculoskeletal:     Cervical back: Neck supple.  Skin:    General: Skin is warm.     Capillary Refill: Capillary refill takes less than 2 seconds.  Neurological:     Mental Status: He is alert. Mental status is at baseline.     ED Results / Procedures / Treatments   Labs (all labs ordered are listed, but only abnormal results are displayed) Labs Reviewed  CBC  BASIC METABOLIC PANEL  URINALYSIS, ROUTINE W REFLEX MICROSCOPIC    EKG None  Radiology No results found.  Procedures Procedures (including critical care time)  Medications Ordered in ED Medications - No data to display  ED Course  I have reviewed the triage vital signs and the nursing notes.  Pertinent labs & imaging results that were available during my care of the patient were reviewed by me and considered in my medical decision making (see chart for details).    MDM Rules/Calculators/A&P                      Patient presents with penile pain.  Has no lymphadenopathy and potential penile mass/tumor.  Has been unable to get the work-up done  as an outpatient due to what appears to be several different issues but also cognitive impairment may be involved in this.  Discussed with Dr. Claudia Desanctis from urology.  We feel the patient may benefit from admission to the hospital to get expedited work-up for this since he may need sedation for the MRI and IR sampling that has been able to get done as an outpatient.  Will discuss with family medicine, the patient's PCP, Dr. Claudia Desanctis will round on the patient either today or tomorrow if he is admitted. Final Clinical Impression(s) / ED Diagnoses Final diagnoses:  Penile pain  Lymphadenopathy    Rx / DC Orders ED Discharge Orders    None       Davonna Belling, MD 11/07/19 1145

## 2019-11-08 DIAGNOSIS — G3184 Mild cognitive impairment, so stated: Secondary | ICD-10-CM | POA: Diagnosis present

## 2019-11-08 DIAGNOSIS — Z20822 Contact with and (suspected) exposure to covid-19: Secondary | ICD-10-CM | POA: Diagnosis present

## 2019-11-08 DIAGNOSIS — R590 Localized enlarged lymph nodes: Secondary | ICD-10-CM | POA: Diagnosis present

## 2019-11-08 DIAGNOSIS — I252 Old myocardial infarction: Secondary | ICD-10-CM | POA: Diagnosis not present

## 2019-11-08 DIAGNOSIS — E785 Hyperlipidemia, unspecified: Secondary | ICD-10-CM | POA: Diagnosis present

## 2019-11-08 DIAGNOSIS — M6740 Ganglion, unspecified site: Secondary | ICD-10-CM | POA: Diagnosis present

## 2019-11-08 DIAGNOSIS — Z87440 Personal history of urinary (tract) infections: Secondary | ICD-10-CM | POA: Diagnosis not present

## 2019-11-08 DIAGNOSIS — Z79899 Other long term (current) drug therapy: Secondary | ICD-10-CM | POA: Diagnosis not present

## 2019-11-08 DIAGNOSIS — I10 Essential (primary) hypertension: Secondary | ICD-10-CM | POA: Diagnosis present

## 2019-11-08 DIAGNOSIS — K5900 Constipation, unspecified: Secondary | ICD-10-CM | POA: Diagnosis present

## 2019-11-08 DIAGNOSIS — R591 Generalized enlarged lymph nodes: Secondary | ICD-10-CM | POA: Diagnosis not present

## 2019-11-08 DIAGNOSIS — N368 Other specified disorders of urethra: Secondary | ICD-10-CM | POA: Diagnosis present

## 2019-11-08 DIAGNOSIS — I69322 Dysarthria following cerebral infarction: Secondary | ICD-10-CM | POA: Diagnosis not present

## 2019-11-08 DIAGNOSIS — Z7982 Long term (current) use of aspirin: Secondary | ICD-10-CM | POA: Diagnosis not present

## 2019-11-08 DIAGNOSIS — D649 Anemia, unspecified: Secondary | ICD-10-CM | POA: Diagnosis present

## 2019-11-08 DIAGNOSIS — C609 Malignant neoplasm of penis, unspecified: Secondary | ICD-10-CM | POA: Diagnosis present

## 2019-11-08 DIAGNOSIS — Z720 Tobacco use: Secondary | ICD-10-CM | POA: Diagnosis not present

## 2019-11-08 DIAGNOSIS — R4189 Other symptoms and signs involving cognitive functions and awareness: Secondary | ICD-10-CM | POA: Diagnosis not present

## 2019-11-08 DIAGNOSIS — N4889 Other specified disorders of penis: Secondary | ICD-10-CM | POA: Diagnosis present

## 2019-11-08 LAB — BASIC METABOLIC PANEL
Anion gap: 9 (ref 5–15)
BUN: 14 mg/dL (ref 8–23)
CO2: 25 mmol/L (ref 22–32)
Calcium: 8.8 mg/dL — ABNORMAL LOW (ref 8.9–10.3)
Chloride: 105 mmol/L (ref 98–111)
Creatinine, Ser: 1.03 mg/dL (ref 0.61–1.24)
GFR calc Af Amer: >60 mL/min (ref >60)
GFR calc non Af Amer: >60 mL/min (ref >60)
Glucose, Bld: 127 mg/dL — ABNORMAL HIGH (ref 70–99)
Potassium: 3.7 mmol/L (ref 3.5–5.1)
Sodium: 139 mmol/L (ref 135–145)

## 2019-11-08 LAB — CBC
HCT: 36 % — ABNORMAL LOW (ref 39.0–52.0)
Hemoglobin: 11.4 g/dL — ABNORMAL LOW (ref 13.0–17.0)
MCH: 28.1 pg (ref 26.0–34.0)
MCHC: 31.7 g/dL (ref 30.0–36.0)
MCV: 88.7 fL (ref 80.0–100.0)
Platelets: 322 10*3/uL (ref 150–400)
RBC: 4.06 MIL/uL — ABNORMAL LOW (ref 4.22–5.81)
RDW: 14.6 % (ref 11.5–15.5)
WBC: 8.3 10*3/uL (ref 4.0–10.5)
nRBC: 0 % (ref 0.0–0.2)

## 2019-11-08 LAB — RPR: RPR Ser Ql: NONREACTIVE

## 2019-11-08 MED ORDER — GADOBUTROL 1 MMOL/ML IV SOLN
7.5000 mL | Freq: Once | INTRAVENOUS | Status: AC | PRN
  Administered 2019-11-08: 7.5 mL via INTRAVENOUS

## 2019-11-08 MED ORDER — OXYCODONE HCL 5 MG PO TABS
2.5000 mg | ORAL_TABLET | Freq: Four times a day (QID) | ORAL | Status: DC
  Administered 2019-11-08 – 2019-11-09 (×4): 2.5 mg via ORAL
  Filled 2019-11-08 (×4): qty 1

## 2019-11-08 MED ORDER — OXYCODONE HCL 5 MG PO TABS
2.5000 mg | ORAL_TABLET | Freq: Four times a day (QID) | ORAL | Status: AC | PRN
  Administered 2019-11-09 – 2019-11-10 (×2): 2.5 mg via ORAL
  Filled 2019-11-08 (×2): qty 1

## 2019-11-08 MED ORDER — ACETAMINOPHEN 325 MG PO TABS
650.0000 mg | ORAL_TABLET | Freq: Four times a day (QID) | ORAL | Status: DC
  Administered 2019-11-08 – 2019-11-11 (×10): 650 mg via ORAL
  Filled 2019-11-08 (×10): qty 2

## 2019-11-08 NOTE — Progress Notes (Signed)
FPTS Interim Progress Note  Spoke with Dr. Reesa Chew regarding possible inguinal lymph node biopsy.  He noted that this could not be done on weekend as could not get pathology sent.  Stated that he could have this completed Monday or Tuesday as inpatient pending schedule if he is still here.  Also reported that if patient needs to be discharged, could schedule outpatient right inguinal adenopathy US guided biopsy next week.    Advised that if patient is still here on Monday, would call IR again to schedule this.  For now, patient remains inpatient to monitor urine output closely given penile mass.  Most likely, it would be the most beneficial for patient to remain inpatient until Monday for this biopsy, given outpatient workup has been very difficult to complete.   [place], DO 11/08/2019, 2:17 PM PGY-2, [facility] Service pager [phone]

## 2019-11-08 NOTE — Progress Notes (Signed)
FPTS Interim Progress Note  Spoke with medical oncologist on-call, Dr. Burr Medico regarding this patient.  She assessed his chart well over the phone.  Notes that patient has already had chest imaging, which she personally reviewed.  Noted that IR would need to biopsy and once we have a tissue diagnosis, if the mass is malignant, can refer to oncology as an outpatient.  She noted that Dr. Alen Blew would likely be the oncologist for the patient given likely GU malignancy.  She stated that she would not formally consult on the patient at this time, but would be available if needed.  Appreciate her recommendations.  [place], DO 11/08/2019, 2:06 PM PGY-2, [facility] Service pager [phone]

## 2019-11-08 NOTE — Consult Note (Addendum)
I have been asked to see the patient by Dr. Sherren Mocha McDiarmid, for evaluation and management of penile pain and possible malignancy.  History of present illness: 74 year old man who presented to the ER with penile pain admitted for expedited work-up of inguinal lymphadenopathy and penile mass.  Patient was scheduled for an MRI as an outpatient however was unable to lay still secondary to pain.  He then presented back to the [facility] ER yesterday.  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  Patient denies recent weight loss and states he is able to empty his bladder.  Patient was seen as an outpatient at [facility] on 10/07/19.  At that time, his PCP was contacted in attempts to schedule IR biopsy.  It is unclear if patient had be scheduled for these previously.  Imaging from December 2020 concerning for metastatic disease.    Review of systems: A 12 point comprehensive review of systems was obtained and is negative unless otherwise stated in the history of present illness.  Patient Active Problem List   Diagnosis Date Noted  . 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 Retroperitoneal lymphadenopathy 11/07/2019  . Penile mass 11/07/2019  . Mild cognitive impairment 10/21/2014  . Essential hypertension 09/11/2014  . Hyperlipidemia 03/18/2014    No current facility-administered medications on file prior to encounter.   Current Outpatient Medications on File Prior to Encounter  Medication Sig Dispense Refill  . aspirin 81 MG chewable tablet Chew 1 tablet (81 mg total) by mouth daily. 90 tablet 3  . metoprolol tartrate (LOPRESSOR) 25 MG tablet Take 1 tablet (25 mg total) by mouth 2 (two) times daily. 180 tablet 1  . lisinopril (ZESTRIL) 10 MG tablet Take 1 tablet (10 mg total) by mouth daily. (Patient not taking: Reported on 11/08/2019) 90 tablet 3    Past Medical History:  Diagnosis Date  . Acromioclavicular joint arthropathy and ganglion cyst 06/03/2016   See  note 06/02/2016.  Marland Kitchen Acute UTI 08/27/2019  . Cerebral thrombosis with cerebral infarction ([place]) 10/20/2014  . Complicated UTI (urinary tract infection) 08/28/2019  . CVA (cerebral infarction)   . Dysarthria due to cerebrovascular accident   . Hyperlipemia   . Hypertension   . Lymphadenopathy   . NSTEMI (non-ST elevated myocardial infarction) ([place])   . Penile pain 11/07/2019  . Shortness of breath dyspnea   . Sinus tachycardia 11/16/2011    Past Surgical History:  Procedure Laterality Date  . LEFT HEART CATHETERIZATION WITH CORONARY ANGIOGRAM N/A 03/18/2014   Procedure: LEFT HEART CATHETERIZATION WITH CORONARY ANGIOGRAM;  Surgeon: Sinclair Grooms, MD;  Location: [facility] CATH LAB;  Service: Cardiovascular;  Laterality: N/A;  . No previous surgery      Social History   Tobacco Use  . Smoking status: Former Smoker    Packs/day: 0.20    Types: Cigarettes    Quit date: 08/18/2014    Years since quitting: 5.2  . Smokeless tobacco: Never Used  Substance Use Topics  . Alcohol use: No    Comment: 3 40 ounce beer per week  . Drug use: No    Family History  Problem Relation Age of Onset  . Heart disease Other        No family history    PE: Vitals:   11/07/19 2042 11/08/19 0504 11/08/19 0756 11/08/19 0954  BP: 124/74 140/78 139/90 131/74  Pulse: 76 72 77 77  Resp: 17 17 15 16   Temp: 98 F (36.7 C) 98.1 F (  36.7 C) 98.2 F (36.8 C) 98.7 F (37.1 C)  TempSrc: Oral Oral Oral Oral  SpO2: 98% 100% 100% 100%  Weight:      Height:       Patient appears to be in no acute distress  patient is alert and oriented x3 Atraumatic normocephalic head No cervical or supraclavicular lymphadenopathy appreciated No increased work of breathing, no audible wheezes/rhonchi Regular sinus rhythm/rate Abdomen is soft, nontender, nondistended GU: Uncircumcised phallus, distal penile shaft is very firm and enlarged which is new since his last physical exam that was done at [facility] urology as an  outpatient, proximal calcified firmness of penile shaft unchanged from prior exam, foreskin retracts easily; bilateral palpable inguinal lymph nodes (RIGHT > LEFT) Lower extremities are symmetric without appreciable edema Grossly neurologically intact No identifiable skin lesions  Recent Labs    11/07/19 1035 11/08/19 0238  WBC 9.0 8.3  HGB 12.6* 11.4*  HCT 41.2 36.0*   Recent Labs    11/07/19 1035 11/08/19 0238  NA 142 139  K 5.1 3.7  CL 106 105  CO2 23 25  GLUCOSE 109* 127*  BUN 10 14  CREATININE 1.11 1.03  CALCIUM 9.5 8.8*   No results for input(s): LABPT, INR in the last 72 hours. No results for input(s): LABURIN in the last 72 hours. Results for orders placed or performed during the hospital encounter of 11/07/19  Respiratory Panel by RT PCR (Flu A&B, Covid) - Nasopharyngeal Swab     Status: None   Collection Time: 11/07/19 12:39 PM   Specimen: Nasopharyngeal Swab  Result Value Ref Range Status   SARS Coronavirus 2 by RT PCR NEGATIVE NEGATIVE Final    Comment: (NOTE) SARS-CoV-2 target nucleic acids are NOT DETECTED. The SARS-CoV-2 RNA is generally detectable in upper respiratoy specimens during the acute phase of infection. The lowest concentration of SARS-CoV-2 viral copies this assay can detect is 131 copies/mL. A negative result does not preclude SARS-Cov-2 infection and should not be used as the sole basis for treatment or other patient management decisions. A negative result may occur with  improper specimen collection/handling, submission of specimen other than nasopharyngeal swab, presence of viral mutation(s) within the areas targeted by this assay, and inadequate number of viral copies (<131 copies/mL). A negative result must be combined with clinical observations, patient history, and epidemiological information. The expected result is Negative. Fact Sheet for Patients:  PinkCheek.be Fact Sheet for Healthcare Providers:   GravelBags.it This test is not yet ap proved or cleared by the [place] FDA and  has been authorized for detection and/or diagnosis of SARS-CoV-2 by FDA under an Emergency Use Authorization (EUA). This EUA will remain  in effect (meaning this test can be used) for the duration of the COVID-19 declaration under Section 564(b)(1) of the Act, 21 U.S.C. section 360bbb-3(b)(1), unless the authorization is terminated or revoked sooner.    Influenza A by PCR NEGATIVE NEGATIVE Final   Influenza B by PCR NEGATIVE NEGATIVE Final    Comment: (NOTE) The Xpert Xpress SARS-CoV-2/FLU/RSV assay is intended as an aid in  the diagnosis of influenza from Nasopharyngeal swab specimens and  should not be used as a sole basis for treatment. Nasal washings and  aspirates are unacceptable for Xpert Xpress SARS-CoV-2/FLU/RSV  testing. Fact Sheet for Patients: PinkCheek.be Fact Sheet for Healthcare Providers: GravelBags.it This test is not yet approved or cleared by the [place] FDA and  has been authorized for detection and/or diagnosis of SARS-CoV-2 by  FDA  under an Emergency Use Authorization (EUA). This EUA will remain  in effect (meaning this test can be used) for the duration of the  Covid-19 declaration under Section 564(b)(1) of the Act, 21  U.S.C. section 360bbb-3(b)(1), unless the authorization is  terminated or revoked. Performed at [facility], [place] [address] [place], [place] [address]     Imaging: MRI Pelvis 11/08/19 IMPRESSION: Mass involving the distal penile shaft, as described above, worrisome for primary penile cancer.  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  Nodal metastases involving the right inguinal region, bilateral external iliac nodes, and bilateral common iliac nodes. These are mildly progressive from prior  CT.  A/P: 74 year old man with 1 month history of penile pain and palpable inguinal lymph nodes now with concern for metastatic penile cancer.  -Recommend IR biopsy of inguinal lymph nodes for tissue diagnosis -Oncology consultation for presumed metastatic penile cancer as primary mode of treatment -Close monitoring of urine output with bladder scan if necessary to ensure patient is able to void with the distal penile shaft mass   Thank you for involving me in this patient's care, I will continue to follow along. Please page with any further questions or concerns. Hai Grabe D Dareld Mcauliffe

## 2019-11-08 NOTE — Progress Notes (Signed)
Family Medicine Teaching Service Daily Progress Note Intern Pager: [phone]  Patient name: Norman Mendez Medical record number: [id number] Date of birth: [birth year] Age: 74 y.o. Gender: male  Primary Care Provider: Patriciaann Clan, DO Consultants: Urology Code Status: Full  Pt Overview and Major Events to Date:  3/12-patient admitted for penile pain  Assessment and Plan:  Norman Mendez is a 74 y.o. male presenting with chronic penile pain. PMH is significant for hypertension, HLD, mild cognitive impairment.  Penile pain Patient admitted in December for UTI found to have retroperitoneal lymphadenopathy concerning for metastatic disease.  After discharge work-up was initiated and patient was scheduled for MRI which was attempted but was unable to be completed due to pain.  MRI was performed overnight.  MRI showed mass involving the distal penile shaft worrisome for primary penile cancer.  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  Nodal metastasis involving the right inguinal region, bilateral external iliac nodes, and bilateral common iliac nodes.  These are mildly progressive from prior CT. -Follow-up on MRI -Urology consulted, will appreciate recommendations -Continue Tylenol as needed for mild to moderate pain -Scheduled oxycodone 2.5 mg every 6 hours -Continue oxycodone 5 mg as needed for severe pain -Consider IR consult for biopsy -Strict I's and O's -Vitals per floor routine  Cognitive impairment On evaluation patient is alert and oriented x3 -Delirium precautions  Hypertension Patient blood pressures over the last 24 hours have ranged from 124/74-171/82.  His home medications include metoprolol and lisinopril. -Continue metoprolol -Monitor blood pressures  Normocytic anemia Hemoglobin on admission was 12.6.  Hemoglobin this morning was 11.4.  Patient denies any blood in urine or stool. -Morning CBC  Hyperlipidemia Patient has  no recent lipid panel.  Most recent lipid panel was from 2016. -Lipid panel outpatient  FEN/GI: Heart healthy diet PPx: Lovenox  Disposition: Pending further work-up from urology  Subjective:  Patient reports he slept well last night.  He is in pain at this time and rates it at a 9 out of 10 on the pain scale.  He has had no pain medicine since approximately midnight where he received a dose of fentanyl to help with his MRI.  Objective: Temp:  [97.6 F (36.4 C)-98.1 F (36.7 C)] 98.1 F (36.7 C) (03/13 0504) Pulse Rate:  [72-92] 72 (03/13 0504) Resp:  [16-17] 17 (03/13 0504) BP: (124-171)/(74-93) 140/78 (03/13 0504) SpO2:  [98 %-100 %] 100 % (03/13 0504) Weight:  [59 kg] 59 kg (03/12 1018) Physical Exam: General: Sleeping comfortably when I enter the room, easy to arouse Cardiovascular: Regular rate and rhythm no murmurs appreciated Respiratory: Clear to auscultation bilaterally, normal work of breathing Abdomen: Soft, nontender, positive bowel sounds Extremities: No edema noted GU: Unable to perform due to chaperone availability, per H&P yesterday afternoon "uncircumcised penis, firm, tender to palpation of glans, clear urethral discharge"  Laboratory: Recent Labs  Lab 11/07/19 1035 11/08/19 0238  WBC 9.0 8.3  HGB 12.6* 11.4*  HCT 41.2 36.0*  PLT 361 322   Recent Labs  Lab 11/07/19 1035 11/08/19 0238  NA 142 139  K 5.1 3.7  CL 106 105  CO2 23 25  BUN 10 14  CREATININE 1.11 1.03  CALCIUM 9.5 8.8*  GLUCOSE 109* 127*  UA -Small hemoglobin -Negative ketones -Moderate leukocytes -Nitrite negative -Few bacteria present -Squamous epithelium 0-5 -WBC 21-50  Imaging/Diagnostic Tests: No results found.   Gifford Shave, MD 11/08/2019, 6:08 AM PGY-1, [place] Intern pager: [phone], text  pages welcome

## 2019-11-08 NOTE — Progress Notes (Signed)
Bladder scan showed 163ml

## 2019-11-08 NOTE — Consult Note (Signed)
[place]  [phone]   HEMATOLOGY ONCOLOGY INPATIENT CONSULTATION   Philippe Poston  DOB: September 06, 1945  MR#: [id number]  CSN#: [id number]    Requesting Physician: Family medicine Dr. 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  Patient was recently hospitalized on 08/27/2019 for UTI about 3 months ago, CT chest abdomen pelvis with contrast showed multiple pathologically enlarged retroperitoneal pelvic lymph nodes, suspicious for malignancy.  He was seen by urology, pelvic MRI was ordered, but the patient was not able to tolerate pelvic MRI with and without contrast was done today after admission, which showed mass involving penile shift, nodal metastasis in right inguinal, bilateral external iliac nodes. Pt was seen by urologist Dr. Claudia Desanctis who recommend IR biopsy of right inguinal node.   MEDICAL HISTORY:  Past Medical History:  Diagnosis Date  . 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  Marland Kitchen Acute UTI 08/27/2019  . 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 Complicated UTI (urinary tract infection) 08/28/2019  . CVA (cerebral infarction)   . Dysarthria due to cerebrovascular accident   . Hyperlipemia   . Hypertension   . Lymphadenopathy   . NSTEMI (non-ST elevated myocardial infarction) ([place])   . Penile pain 11/07/2019  . Shortness of breath dyspnea   . Sinus tachycardia 11/16/2011    SURGICAL HISTORY: Past Surgical History:  Procedure Laterality Date  . LEFT HEART CATHETERIZATION WITH CORONARY ANGIOGRAM N/A 03/18/2014     Procedure: LEFT HEART CATHETERIZATION WITH CORONARY ANGIOGRAM;  Surgeon: Sinclair Grooms, MD;  Location: [facility] CATH LAB;  Service: Cardiovascular;  Laterality: N/A;  . No previous surgery      SOCIAL HISTORY: Social History   Socioeconomic History  . Marital status: Single    Spouse name: Not on file  . Number of children: 4  . Years of education: Not on file  . Highest education level: Not on file  Occupational History    Comment: Retired housekeeper  Tobacco Use  . Smoking status: Former Smoker    Packs/day: 0.20    Types: Cigarettes    Quit date: 08/18/2014    Years since quitting: 5.2  . Smokeless tobacco: Never Used  Substance and Sexual Activity  . Alcohol use: No    Comment: 3 40 ounce beer per week  . Drug use: No  . Sexual activity: Not on file  Other Topics Concern  . Not on file  Social History Narrative  . Not on file   Social Determinants of Health   Financial Resource Strain:   . Difficulty of Paying Living Expenses:   Food Insecurity:   . Worried About Charity fundraiser in the Last Year:   . Arboriculturist in the Last Year:   Transportation Needs:   . Film/video editor (Medical):   Marland Kitchen Lack of Transportation (Non-Medical):   Physical Activity:   . Days of Exercise per Week:   . Minutes of Exercise per Session:   Stress:   . Feeling of Stress :   Social Connections:   . Frequency of Communication with Friends and Family:   .  Frequency of Social Gatherings with Friends and Family:   . Attends Religious Services:   . Active Member of Clubs or Organizations:   . Attends Archivist Meetings:   Marland Kitchen Marital Status:   Intimate Partner Violence:   . Fear of Current or Ex-Partner:   . Emotionally Abused:   Marland Kitchen Physically Abused:   . Sexually Abused:     FAMILY HISTORY: Family History  Problem Relation Age of Onset  . Heart disease Other        No family history    ALLERGIES:  has No Known Allergies.  MEDICATIONS:  Current  Facility-Administered Medications  Medication Dose Route Frequency Provider Last Rate Last Admin  . acetaminophen (TYLENOL) tablet 650 mg  650 mg Oral Q6H McDiarmid, Blane Ohara, MD   650 mg at 11/08/19 2038  . aspirin chewable tablet 81 mg  81 mg Oral Daily Anderson, Chelsey L, DO   81 mg at 11/08/19 1013  . enoxaparin (LOVENOX) injection 40 mg  40 mg Subcutaneous Q24H Anderson, Chelsey L, DO   40 mg at 11/08/19 1433  . lisinopril (ZESTRIL) tablet 10 mg  10 mg Oral Daily Anderson, Chelsey L, DO   10 mg at 11/08/19 1013  . metoprolol tartrate (LOPRESSOR) tablet 25 mg  25 mg Oral BID Ouida Sills, Chelsey L, DO   25 mg at 11/08/19 2038  . multivitamin with minerals tablet 1 tablet  1 tablet Oral Daily Anderson, Chelsey L, DO   1 tablet at 11/08/19 1018  . oxyCODONE (Oxy IR/ROXICODONE) immediate release tablet 2.5 mg  2.5 mg Oral Q6H Gifford Shave, MD   2.5 mg at 11/08/19 1814  . oxyCODONE (Oxy IR/ROXICODONE) immediate release tablet 2.5 mg  2.5 mg Oral Q6H PRN McDiarmid, Blane Ohara, MD        REVIEW OF SYSTEMS:   Constitutional: Denies fevers, chills or abnormal night sweats, no recent weight loss  Eyes: Denies blurriness of vision, double vision or watery eyes Ears, nose, mouth, throat, and face: Denies mucositis or sore throat Respiratory: Denies cough, dyspnea or wheezes Cardiovascular: Denies palpitation, chest discomfort or lower extremity swelling Gastrointestinal:  Denies nausea, heartburn or change in bowel habits Skin: Denies abnormal skin rashes GU: penile pain  Neurological:Denies numbness, tingling or new weaknesses Behavioral/Psych: Mood is stable, no new changes  All other systems were reviewed with the patient and are negative.  PHYSICAL EXAMINATION: ECOG PERFORMANCE STATUS: 2 - Symptomatic, <50% confined to bed  Vitals:   11/08/19 1351 11/08/19 2143  BP: (!) 141/82 (!) 146/77  Pulse: 76 74  Resp: 17 17  Temp: 98 F (36.7 C) 97.8 F (36.6 C)  SpO2: 98% 98%   Filed Weights    11/07/19 1018  Weight: 130 lb (59 kg)    GENERAL:alert, no distress and comfortable SKIN: skin color, texture, turgor are normal, no rashes or significant lesions LYMPH:  no palpable lymphadenopathy in the cervical, axillary, (+) bilateral multiple palpable inguinal lymph nodes, R>L LUNGS: clear to auscultation and percussion with normal breathing effort HEART: regular rate & rhythm and no murmurs and no lower extremity edema ABDOMEN:abdomen soft, non-tender and normal bowel sounds, rectal exam was negative for mass or blood GU: palpable firm mass in proximal penile shaft, no skin lesions    LABORATORY DATA:  I have reviewed the data as listed Lab Results  Component Value Date   WBC 8.3 11/08/2019   HGB 11.4 (L) 11/08/2019   HCT 36.0 (L) 11/08/2019   MCV 88.7 11/08/2019  PLT 322 11/08/2019   Recent Labs    08/27/19 1655 08/28/19 1143 09/29/19 0807 11/07/19 1035 11/08/19 0238  NA 137   < > 142 142 139  K 3.6   < > 3.3* 5.1 3.7  CL 101   < > 106 106 105  CO2 22   < > 25 23 25   GLUCOSE 151*   < > 116* 109* 127*  BUN 13   < > 6* 10 14  CREATININE 1.21   < > 0.95 1.11 1.03  CALCIUM 9.4   < > 8.8* 9.5 8.8*  GFRNONAA 59*   < > >60 >60 >60  GFRAA >60   < > >60 >60 >60  PROT 8.8*  --   --   --   --   ALBUMIN 3.5  --   --   --   --   AST 20  --   --   --   --   ALT 22  --   --   --   --   ALKPHOS 97  --   --   --   --   BILITOT 1.5*  --   --   --   --    < > = values in this interval not displayed.    RADIOGRAPHIC STUDIES: I have personally reviewed the radiological images as listed and agreed with the findings in the report. MR PELVIS W WO CONTRAST  Result Date: 11/08/2019 CLINICAL DATA:  Penile pain, known pelvic/retroperitoneal lymphadenopathy, evaluate for primary EXAM: MRI PELVIS WITHOUT AND WITH CONTRAST TECHNIQUE: Multiplanar multisequence MR imaging of the pelvis was performed both before and after administration of intravenous contrast. CONTRAST:  7.42mL GADAVIST  GADOBUTROL 1 MMOL/ML IV SOLN COMPARISON:  CT abdomen/pelvis dated 08/27/2019 FINDINGS: Urinary Tract:  Bladder is within normal limits. Dilated penile urethra, extending to the level of the mid/distal penile shaft, with abrupt cutoff (series 11/image 23). Bowel:  Visualized bowel is unremarkable. Vascular/Lymphatic: No evidence of aneurysm. Pelvic/retroperitoneal lymphadenopathy, including: --Right inguinal nodes measuring up to 2.1 cm short axis (series 12/image 21), previously 1.8 cm --Bilateral external iliac nodes, measuring 2.3 cm on the right and 1.6 cm on the left (series 12/image 15), previously 1.6 cm on the right --1.3 cm short axis right common iliac node (series 10/image 10), grossly unchanged --1.1 cm short axis left common iliac node (series 10/image 5), previously 5 mm Reproductive: 1.0 x 0.8 x 4.9 cm mass along the distal penile shaft extending to the fossa navicularis (series 12/image 17; series 14/image 20). This may involve the right glans penis and likely involves the corpus spongiosum. This mass is also conspicuous following contrast enhancement (series 27/image 52). This is worrisome for primary penile cancer and results in obstruction and dilatation of the proximal/mid penile urethra, as described above. Additional multifocal lesions within the corpus cavernosum bilaterally, including a 1.8 x 4.1 cm lesion in the right proximal penile shaft (series 2/image 31), a 1.2 x 2.0 cm lesion in the left mid penile shaft (series 12/image 30) and an adjacent 1.4 x 1.4 cm lesion in the left mid penile shaft (series 12/image 28). These are all most conspicuous on diffusion imaging/ADC (series 18), where additional lesions are evident, worrisome for penile metastases. Bilateral testes are within normal limits. Other:  No pelvic ascites. Musculoskeletal: Degenerative changes of the lower lumbar spine. IMPRESSION: Mass involving the distal penile shaft, as described above, worrisome for primary penile cancer.  Suspected multifocal penile metastases involving the proximal/mid penile  shaft, as above. Nodal metastases involving the right inguinal region, bilateral external iliac nodes, and bilateral common iliac nodes. These are mildly progressive from prior CT. Electronically Signed   By: Julian Hy M.D.   On: 11/08/2019 06:26    ASSESSMENT & PLAN:  74 yo male with   1. 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 HTN 3.  Cognitive impairment  Recommendations: -I have reviewed his CT and MRI scan images in person, and agree with radiologist interpretation.  This is highly suspicious for penile cancer with nodal metastasis -I agree with IR ultrasound-guided right inguinal lymph node biopsy.  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 Will probably offer palliative RT, and will discuss chemo with pt and his daughter. Due to his impaired cognitive function, and poor social support,he is not a good candidate for chemo.  -I will call his daughter tomorrow.      Truitt Merle, MD 11/08/2019

## 2019-11-09 LAB — CBC
HCT: 37.2 % — ABNORMAL LOW (ref 39.0–52.0)
Hemoglobin: 11.8 g/dL — ABNORMAL LOW (ref 13.0–17.0)
MCH: 28.3 pg (ref 26.0–34.0)
MCHC: 31.7 g/dL (ref 30.0–36.0)
MCV: 89.2 fL (ref 80.0–100.0)
Platelets: 328 10*3/uL (ref 150–400)
RBC: 4.17 MIL/uL — ABNORMAL LOW (ref 4.22–5.81)
RDW: 14.6 % (ref 11.5–15.5)
WBC: 13.2 10*3/uL — ABNORMAL HIGH (ref 4.0–10.5)
nRBC: 0 % (ref 0.0–0.2)

## 2019-11-09 NOTE — Progress Notes (Signed)
Family Medicine Teaching Service Daily Progress Note Intern Pager: [phone]  Patient name: Norman Mendez Medical record number: [id number] Date of birth: 1946/01/10 Age: 74 y.o. Gender: male  Primary Care Provider: Patriciaann Clan, DO Consultants: Urology Code Status: Full  Pt Overview and Major Events to Date:  3/12-patient admitted for penile pain  Assessment and Plan:  Norman Mendez is a 74 y.o. male presenting with chronic penile pain. PMH is significant for hypertension, HLD, mild cognitive impairment.  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 Will precede with IR US-guided inguinal node biopsy while inpatient and follow up with urology/oncology outpatient per specialist recommendations. Patient does not complain of pain or urinary retention today. Has not used the PRN oxy for >24hrs. Will remove scheduled oxy today as he seems to be well controlled on scheduled tylenol and low dose oxy. - urology OP - oncology OP - f/u IR for biopsy - continue scheduled tylenol - PRN oxy 2.5mg  q6hr - strict I/Os - rapid HIV screen tomorrow am  Cognitive impairment On evaluation patient is alert and oriented x3 -Delirium precautions  Hypertension-well controlled on home medications. -Continue metoprolol, lisinopril -Monitor blood pressures  Normocytic anemia- stable at 11.4 yesterday with no signs of active bleeding. Will recheck CBC tomorrow -Morning CBC  Hyperlipidemia Patient has no recent lipid panel.  Most recent lipid panel was from 2016. -Lipid panel outpatient  FEN/GI: Heart healthy diet PPx: Lovenox  Disposition: Pending IR biopsy  Subjective:  Patient reports no complaints today. He has no difficulty with urinating and his pain is well controlled. Has no questions or concerns at this time.   Objective: Temp:  [97.8 F (36.6 C)-98 F (36.7 C)] 98 F (36.7 C) (03/14 0438) Pulse Rate:  [74-76] 75  (03/14 0438) Resp:  [16-17] 16 (03/14 0438) BP: (141-146)/(65-82) 145/65 (03/14 0438) SpO2:  [98 %-100 %] 100 % (03/14 0438) Physical Exam: General: resting comfortably in bed Cardiovascular: Regular rate and rhythm no murmurs appreciated Respiratory: Clear to auscultation bilaterally, normal work of breathing Abdomen: Soft, nontender, positive bowel sounds Extremities: No edema noted  Laboratory: Recent Labs  Lab 11/07/19 1035 11/08/19 0238  WBC 9.0 8.3  HGB 12.6* 11.4*  HCT 41.2 36.0*  PLT 361 322   Recent Labs  Lab 11/07/19 1035 11/08/19 0238  NA 142 139  K 5.1 3.7  CL 106 105  CO2 23 25  BUN 10 14  CREATININE 1.11 1.03  CALCIUM 9.5 8.8*  GLUCOSE 109* 127*  UA -Small hemoglobin -Negative ketones -Moderate leukocytes -Nitrite negative -Few bacteria present -Squamous epithelium 0-5 -WBC 21-50  Imaging/Diagnostic Tests: MR PELVIS W WO CONTRAST  Result Date: 11/08/2019 CLINICAL DATA:  Penile pain, known pelvic/retroperitoneal lymphadenopathy, evaluate for primary EXAM: MRI PELVIS WITHOUT AND WITH CONTRAST TECHNIQUE: Multiplanar multisequence MR imaging of the pelvis was performed both before and after administration of intravenous contrast. CONTRAST:  7.51mL GADAVIST GADOBUTROL 1 MMOL/ML IV SOLN COMPARISON:  CT abdomen/pelvis dated 08/27/2019 FINDINGS: Urinary Tract:  Bladder is within normal limits. Dilated penile urethra, extending to the level of the mid/distal penile shaft, with abrupt cutoff (series 11/image 23). Bowel:  Visualized bowel is unremarkable. Vascular/Lymphatic: No evidence of aneurysm. Pelvic/retroperitoneal lymphadenopathy, including: --Right inguinal nodes measuring up to 2.1 cm short axis (series 12/image 21), previously 1.8 cm --Bilateral external iliac nodes, measuring 2.3 cm on the right and 1.6 cm on the left (series 12/image 15), previously 1.6 cm on the right --1.3 cm short  axis right common iliac node (series 10/image 10), grossly unchanged --1.1  cm short axis left common iliac node (series 10/image 5), previously 5 mm Reproductive: 1.0 x 0.8 x 4.9 cm mass along the distal penile shaft extending to the fossa navicularis (series 12/image 17; series 14/image 20). This may involve the right glans penis and likely involves the corpus spongiosum. This mass is also conspicuous following contrast enhancement (series 27/image 52). This is worrisome for primary penile cancer and results in obstruction and dilatation of the proximal/mid penile urethra, as described above. Additional multifocal lesions within the corpus cavernosum bilaterally, including a 1.8 x 4.1 cm lesion in the right proximal penile shaft (series 2/image 31), a 1.2 x 2.0 cm lesion in the left mid penile shaft (series 12/image 30) and an adjacent 1.4 x 1.4 cm lesion in the left mid penile shaft (series 12/image 28). These are all most conspicuous on diffusion imaging/ADC (series 18), where additional lesions are evident, worrisome for penile metastases. Bilateral testes are within normal limits. Other:  No pelvic ascites. Musculoskeletal: Degenerative changes of the lower lumbar spine. IMPRESSION: Mass involving the distal penile shaft, as described above, worrisome for primary penile cancer. 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 Nodal metastases involving the right inguinal region, bilateral external iliac nodes, and bilateral common iliac nodes. These are mildly progressive from prior CT. Electronically Signed   By: Julian Hy M.D.   On: 11/08/2019 06:26     Richarda Osmond, DO 11/09/2019, 10:15 AM PGY-2, [place] Intern pager: [phone], text pages welcome

## 2019-11-10 LAB — CBC
HCT: 37 % — ABNORMAL LOW (ref 39.0–52.0)
Hemoglobin: 11.7 g/dL — ABNORMAL LOW (ref 13.0–17.0)
MCH: 28.3 pg (ref 26.0–34.0)
MCHC: 31.6 g/dL (ref 30.0–36.0)
MCV: 89.6 fL (ref 80.0–100.0)
Platelets: 333 10*3/uL (ref 150–400)
RBC: 4.13 MIL/uL — ABNORMAL LOW (ref 4.22–5.81)
RDW: 14.4 % (ref 11.5–15.5)
WBC: 9.1 10*3/uL (ref 4.0–10.5)
nRBC: 0 % (ref 0.0–0.2)

## 2019-11-10 LAB — HIV ANTIBODY (ROUTINE TESTING W REFLEX): HIV Screen 4th Generation wRfx: NONREACTIVE

## 2019-11-10 MED ORDER — CEFAZOLIN SODIUM-DEXTROSE 2-4 GM/100ML-% IV SOLN
INTRAVENOUS | Status: AC
  Filled 2019-11-10: qty 100

## 2019-11-10 MED ORDER — TRAMADOL HCL 50 MG PO TABS
25.0000 mg | ORAL_TABLET | Freq: Four times a day (QID) | ORAL | Status: DC | PRN
  Administered 2019-11-10 – 2019-11-11 (×2): 25 mg via ORAL
  Filled 2019-11-10 (×2): qty 1

## 2019-11-10 MED ORDER — SODIUM CHLORIDE 0.9 % IV SOLN
INTRAVENOUS | Status: AC
  Filled 2019-11-10: qty 2

## 2019-11-10 MED ORDER — ENOXAPARIN SODIUM 40 MG/0.4ML ~~LOC~~ SOLN: 40.0000 mg | SUBCUTANEOUS | Status: DC

## 2019-11-10 NOTE — Consult Note (Signed)
Chief Complaint: Patient was seen in consultation today for inguinal lymph node biopsy Chief Complaint  Patient presents with  . Penis Pain   at the request of Dr McDiarmid   Supervising Physician: Arne Cleveland  Patient Status: [facility] - In-pt  History of Present Illness: Norman Mendez is a 74 y.o. male   Admitted with penile pain Hx admission 07/2019 for UTI and imaging then did reveal RP LAN suspicious for cancer. OP bx was recommended- but noncompliant  Now admitted again with continued pain  Imaging 3/12:  IMPRESSION: Mass involving the distal penile shaft, as described above, worrisome for primary penile cancer. 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 Nodal metastases involving the right inguinal region, bilateral external iliac nodes, and bilateral common iliac nodes. These are mildly progressive from prior CT.  Request now for tissue diagnosis Dr Vernard Gambles has reviewed imaging and approves procedure  Scheduled now for right inguinal lymph node biopsy   Past Medical History:  Diagnosis Date  . 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  Marland Kitchen Acute UTI 08/27/2019  . 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 Complicated UTI (urinary tract infection) 08/28/2019  . CVA (cerebral infarction)   . Dysarthria due to cerebrovascular accident   . Hyperlipemia   . Hypertension   . Lymphadenopathy   . NSTEMI (non-ST elevated myocardial infarction) ([place])   . Penile pain 11/07/2019  . Shortness of breath dyspnea   . Sinus tachycardia 11/16/2011    Past Surgical History:  Procedure Laterality Date  . LEFT HEART CATHETERIZATION WITH CORONARY ANGIOGRAM N/A 03/18/2014   Procedure: LEFT HEART CATHETERIZATION WITH CORONARY ANGIOGRAM;  Surgeon: Sinclair Grooms, MD;  Location: [facility] CATH LAB;  Service: Cardiovascular;  Laterality: N/A;  . No previous surgery        Allergies: Patient has no known allergies.  Medications: Prior to Admission medications   Medication Sig Start Date End Date Taking? Authorizing Provider  aspirin 81 MG chewable tablet Chew 1 tablet (81 mg total) by mouth daily. 12/31/18  Yes Orson Eva J, DO  metoprolol tartrate (LOPRESSOR) 25 MG tablet Take 1 tablet (25 mg total) by mouth 2 (two) times daily. 06/19/19  Yes Beard, Samantha N, DO  lisinopril (ZESTRIL) 10 MG tablet Take 1 tablet (10 mg total) by mouth daily. Patient not taking: Reported on 11/08/2019 12/31/18   Bufford Lope, DO     Family History  Problem Relation Age of Onset  . Heart disease Other        No family history    Social History   Socioeconomic History  . Marital status: Single    Spouse name: Not on file  . Number of children: 4  . Years of education: Not on file  . Highest education level: Not on file  Occupational History    Comment: Retired housekeeper  Tobacco Use  . Smoking status: Former Smoker    Packs/day: 0.20    Types: Cigarettes    Quit date: 08/18/2014    Years since quitting: 5.2  . Smokeless tobacco: Never Used  Substance and Sexual Activity  . Alcohol use: No    Comment: 3 40 ounce beer per week  . Drug use: No  . Sexual activity: Not on file  Other Topics Concern  . Not on file  Social History Narrative  . Not on file   Social Determinants of Health   Financial Resource Strain:   .  Difficulty of Paying Living Expenses:   Food Insecurity:   . Worried About Charity fundraiser in the Last Year:   . Arboriculturist in the Last Year:   Transportation Needs:   . Film/video editor (Medical):   Marland Kitchen Lack of Transportation (Non-Medical):   Physical Activity:   . Days of Exercise per Week:   . Minutes of Exercise per Session:   Stress:   . Feeling of Stress :   Social Connections:   . Frequency of Communication with Friends and Family:   . Frequency of Social Gatherings with Friends and Family:   . Attends  Religious Services:   . Active Member of Clubs or Organizations:   . Attends Archivist Meetings:   Marland Kitchen Marital Status:      Review of Systems: A 12 point ROS discussed and pertinent positives are indicated in the HPI above.  All other systems are negative.  Review of Systems  Constitutional: Positive for activity change. Negative for appetite change, fatigue and fever.  Respiratory: Negative for cough and shortness of breath.   Cardiovascular: Negative for chest pain.  Gastrointestinal: Negative for anal bleeding.  Genitourinary: Positive for penile pain.  Psychiatric/Behavioral: Negative for behavioral problems and confusion.    Vital Signs: BP 134/85 (BP Location: Right Arm)   Pulse 84   Temp 98 F (36.7 C) (Oral)   Resp 16   Ht 5\' 6"  (1.676 m)   Wt 130 lb (59 kg)   SpO2 100%   BMI 20.98 kg/m   Physical Exam Vitals reviewed.  Cardiovascular:     Rate and Rhythm: Normal rate and regular rhythm.     Heart sounds: Normal heart sounds.  Pulmonary:     Breath sounds: Normal breath sounds.  Abdominal:     Palpations: Abdomen is soft.  Musculoskeletal:        General: Normal range of motion.  Skin:    General: Skin is dry.  Neurological:     Mental Status: He is alert. Mental status is at baseline.  Psychiatric:        Behavior: Behavior normal.        Thought Content: Thought content normal.        Judgment: Judgment normal.     Comments: spoke to daughter [name] via phone-- consents to procedure     Imaging: MR PELVIS W WO CONTRAST  Result Date: 11/08/2019 CLINICAL DATA:  Penile pain, known pelvic/retroperitoneal lymphadenopathy, evaluate for primary EXAM: MRI PELVIS WITHOUT AND WITH CONTRAST TECHNIQUE: Multiplanar multisequence MR imaging of the pelvis was performed both before and after administration of intravenous contrast. CONTRAST:  7.73mL GADAVIST GADOBUTROL 1 MMOL/ML IV SOLN COMPARISON:  CT abdomen/pelvis dated 08/27/2019 FINDINGS: Urinary Tract:   Bladder is within normal limits. Dilated penile urethra, extending to the level of the mid/distal penile shaft, with abrupt cutoff (series 11/image 23). Bowel:  Visualized bowel is unremarkable. Vascular/Lymphatic: No evidence of aneurysm. Pelvic/retroperitoneal lymphadenopathy, including: --Right inguinal nodes measuring up to 2.1 cm short axis (series 12/image 21), previously 1.8 cm --Bilateral external iliac nodes, measuring 2.3 cm on the right and 1.6 cm on the left (series 12/image 15), previously 1.6 cm on the right --1.3 cm short axis right common iliac node (series 10/image 10), grossly unchanged --1.1 cm short axis left common iliac node (series 10/image 5), previously 5 mm Reproductive: 1.0 x 0.8 x 4.9 cm mass along the distal penile shaft extending to the fossa navicularis (series 12/image 17; series  14/image 20). This may involve the right glans penis and likely involves the corpus spongiosum. This mass is also conspicuous following contrast enhancement (series 27/image 52). This is worrisome for primary penile cancer and results in obstruction and dilatation of the proximal/mid penile urethra, as described above. Additional multifocal lesions within the corpus cavernosum bilaterally, including a 1.8 x 4.1 cm lesion in the right proximal penile shaft (series 2/image 31), a 1.2 x 2.0 cm lesion in the left mid penile shaft (series 12/image 30) and an adjacent 1.4 x 1.4 cm lesion in the left mid penile shaft (series 12/image 28). These are all most conspicuous on diffusion imaging/ADC (series 18), where additional lesions are evident, worrisome for penile metastases. Bilateral testes are within normal limits. Other:  No pelvic ascites. Musculoskeletal: Degenerative changes of the lower lumbar spine. IMPRESSION: Mass involving the distal penile shaft, as described above, worrisome for primary penile cancer. 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 Nodal  metastases involving the right inguinal region, bilateral external iliac nodes, and bilateral common iliac nodes. These are mildly progressive from prior CT. Electronically Signed   By: Julian Hy M.D.   On: 11/08/2019 06:26    Labs:  CBC: Recent Labs    11/07/19 1035 11/08/19 0238 11/09/19 1104 11/10/19 0633  WBC 9.0 8.3 13.2* 9.1  HGB 12.6* 11.4* 11.8* 11.7*  HCT 41.2 36.0* 37.2* 37.0*  PLT 361 322 328 333    COAGS: No results for input(s): INR, APTT in the last 8760 hours.  BMP: Recent Labs    08/28/19 1143 09/29/19 0807 11/07/19 1035 11/08/19 0238  NA 139 142 142 139  K 3.8 3.3* 5.1 3.7  CL 106 106 106 105  CO2 23 25 23 25   GLUCOSE 104* 116* 109* 127*  BUN 10 6* 10 14  CALCIUM 8.6* 8.8* 9.5 8.8*  CREATININE 0.85 0.95 1.11 1.03  GFRNONAA >60 >60 >60 >60  GFRAA >60 >60 >60 >60    LIVER FUNCTION TESTS: Recent Labs    08/27/19 1655  BILITOT 1.5*  AST 20  ALT 22  ALKPHOS 97  PROT 8.8*  ALBUMIN 3.5    TUMOR MARKERS: No results for input(s): AFPTM, CEA, CA199, CHROMGRNA in the last 8760 hours.  Assessment and Plan:  Penile pain Penile mass and lymphadenopathy noted on MRI Now scheduled for Rt inguinal LN biopsy in IR 3/16 Risks and benefits of inguinal lymph node biopsy was discussed with the patient and Dtr [name]  (via phone) including, but not limited to bleeding, infection, damage to adjacent structures or low yield requiring additional tests.  All of the questions were answered and there is agreement to proceed. Consent signed and in chart.   Thank you for this interesting consult.  I greatly enjoyed meeting Norman Mendez and look forward to participating in their care.  A copy of this report was sent to the requesting provider on this date.  Electronically Signed: Lavonia Drafts, PA-C 11/10/2019, 11:41 AM   I spent a total of 40 Minutes    in face to face in clinical consultation, greater than 50% of which was counseling/coordinating  care for inguinal; LN bx

## 2019-11-10 NOTE — Progress Notes (Signed)
Family Medicine Teaching Service Daily Progress Note Intern Pager: [phone]  Patient name: Norman Mendez Medical record number: [id number] Date of birth: 08-05-1946 Age: 74 y.o. Gender: male  Primary Care Provider: Patriciaann Clan, DO Consultants: Urology Code Status: Full  Pt Overview and Major Events to Date:  3/12-patient admitted for penile pain  Assessment and Plan:  Norman Mendez is a 74 y.o. male presenting with chronic penile pain. PMH is significant for hypertension, HLD, mild cognitive impairment.  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 Will precede with IR US-guided inguinal node biopsy while inpatient and follow up with urology/oncology outpatient per specialist recommendations.  Patient has no issues with urinary retention today.  Patient does have issues with pain control.  Patient was on scheduled Tylenol at this time.  Also put in as needed tramadol every 6 hours. - urology OP - oncology OP - f/u IR for biopsy - continue scheduled tylenol - strict I/Os - rapid HIV screen tomorrow am -As needed tramadol 25 mg every 6 hours  Cognitive impairment On evaluation patient is alert and oriented x3 -Delirium precautions  Hypertension-well controlled on home medications. -Continue metoprolol, lisinopril -Monitor blood pressures  Normocytic anemia- stable at 11.4 yesterday with no signs of active bleeding. Will recheck CBC tomorrow -Morning CBC  Hyperlipidemia Patient has no recent lipid panel.  Most recent lipid panel was from 2016. -Lipid panel outpatient  FEN/GI: Heart healthy diet PPx: Lovenox  Disposition: Pending IR biopsy  Subjective:  Patient reports that his pain is not well controlled this morning.  He says it is a 9 out of 10 on the pain scale.  He has not received any as needed [place] since last night.  He has questions regarding when his biopsy will be performed.  Unsure at this  time.  Objective: Temp:  [98 F (36.7 C)-98.6 F (37 C)] 98 F (36.7 C) (03/15 0431) Pulse Rate:  [84-91] 84 (03/15 0431) Resp:  [16-18] 16 (03/15 0431) BP: (121-134)/(62-107) 134/85 (03/15 0431) SpO2:  [97 %-100 %] 100 % (03/15 0431) Physical Exam: General: Lying comfortably in bedside chair.  Complaining of pain Cardiovascular: Regular rate and rhythm, no murmurs appreciated Respiratory: Normal work of breathing, clear to auscultation bilaterally Abdomen: Soft, nontender, positive bowel sounds Extremities: No edema noted  Laboratory: Recent Labs  Lab 11/07/19 1035 11/08/19 0238 11/09/19 1104  WBC 9.0 8.3 13.2*  HGB 12.6* 11.4* 11.8*  HCT 41.2 36.0* 37.2*  PLT 361 322 328   Recent Labs  Lab 11/07/19 1035 11/08/19 0238  NA 142 139  K 5.1 3.7  CL 106 105  CO2 23 25  BUN 10 14  CREATININE 1.11 1.03  CALCIUM 9.5 8.8*  GLUCOSE 109* 127*  UA -Small hemoglobin -Negative ketones -Moderate leukocytes -Nitrite negative -Few bacteria present -Squamous epithelium 0-5 -WBC 21-50  Imaging/Diagnostic Tests: No results found.  Gifford Shave, MD 11/10/2019, 5:50 AM PGY-1, [place] Intern pager: [phone], text pages welcome

## 2019-11-10 NOTE — Plan of Care (Signed)

## 2019-11-11 ENCOUNTER — Inpatient Hospital Stay (HOSPITAL_COMMUNITY): Payer: Medicare Other

## 2019-11-11 ENCOUNTER — Other Ambulatory Visit: Payer: Self-pay | Admitting: Anatomic Pathology & Clinical Pathology

## 2019-11-11 ENCOUNTER — Encounter: Payer: Self-pay | Admitting: General Practice

## 2019-11-11 DIAGNOSIS — R4189 Other symptoms and signs involving cognitive functions and awareness: Secondary | ICD-10-CM

## 2019-11-11 LAB — PROTIME-INR
INR: 0.9 (ref 0.8–1.2)
Prothrombin Time: 12.5 seconds (ref 11.4–15.2)

## 2019-11-11 MED ORDER — FENTANYL CITRATE (PF) 100 MCG/2ML IJ SOLN
INTRAMUSCULAR | Status: AC | PRN
  Administered 2019-11-11 (×2): 25 ug via INTRAVENOUS

## 2019-11-11 MED ORDER — MIDAZOLAM HCL 2 MG/2ML IJ SOLN
INTRAMUSCULAR | Status: AC
  Filled 2019-11-11: qty 2

## 2019-11-11 MED ORDER — FENTANYL CITRATE (PF) 100 MCG/2ML IJ SOLN
INTRAMUSCULAR | Status: AC
  Filled 2019-11-11: qty 2

## 2019-11-11 MED ORDER — MIDAZOLAM HCL 2 MG/2ML IJ SOLN
INTRAMUSCULAR | Status: AC | PRN
  Administered 2019-11-11 (×2): 1 mg via INTRAVENOUS

## 2019-11-11 MED ORDER — TRAMADOL HCL 50 MG PO TABS: 50.0000 mg | ORAL_TABLET | Freq: Four times a day (QID) | ORAL | 0 refills | Status: AC | PRN

## 2019-11-11 MED ORDER — LIDOCAINE-EPINEPHRINE 1 %-1:100000 IJ SOLN
INTRAMUSCULAR | Status: AC
  Filled 2019-11-11: qty 1

## 2019-11-11 NOTE — Procedures (Signed)
Pre Procedure Dx: Inguinal lymphadenopathy Post Procedural Dx: Same  Technically successful US guided biopsy of dominant right inguinal lymph node  EBL: None  No immediate complications.   Ronny Bacon, MD Pager #: [phone]

## 2019-11-11 NOTE — Progress Notes (Signed)
   11/11/19 1122  MEWS Score  Level of Consciousness Responds to Voice (pt sleepy from procedure, easily arousable)  MEWS Score  MEWS Temp 0  MEWS Systolic 0  MEWS Pulse 1  MEWS RR 0  MEWS LOC 1  MEWS Score 2  MEWS Score Color Yellow  MEWS Assessment  Is this an acute change? Yes  MEWS guidelines implemented *See Row Information* Yellow  Provider Notification  Provider Name/Title Gifford Shave, MD  Date Provider Notified 11/11/19  Time Provider Notified 1122  Notification Type Page  Notification Reason Other (Comment) (per protocol)  Response No new orders  Date of Provider Response 11/11/19  Time of Provider Response 1125

## 2019-11-11 NOTE — Discharge Summary (Signed)
[facility] Discharge Summary  Patient name: Norman Mendez Medical record number: [id number] Date of birth: [birth year] Age: 74 y.o. 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  He says that he is to use the restroom.  Rates his pain 9 out of 10 on pain scale because he has not received any pain medicine overnight Cardiovascular: Regular rate and rhythm, no murmurs appreciated Respiratory: Clear to auscultation bilaterally, normal work of breathing Abdomen: Soft, nontender, positive bowel sounds  Extremities: No edema noted  Brief Hospital Course:  Patient has history of penile lesion that was concerning for cancer and was told he needed to follow-up for diagnosis. Patient presented to the ED after being unable to tolerate MRI due to penile pain.  Patient was seen by urology who recommended pain management so the patient could receive his MRI.  MRI showed mass involving the distal penile shaft worrisome for primary penile cancer.  Also suspected multifocal penile metastasis involving the proximal penile shaft.  There was nodal metastasis involving the right inguinal region, bilateral external iliac nodes, and bilateral common iliac nodes.  These are  mildly progressive from prior CT.  Lymph node biopsy was recommended.  Patient pain was managed initially with oxycodone 2.5 mg scheduled and then was transitioned ultimately to tramadol 30 mg every 6 hours as needed.  On 3/16 patient was taken to the OR for lymph node biopsy.  He was then stable for discharge with close follow-up with his oncologist as well as PCP.   Issues for Follow Up:  1. Patient was referred Chronic Case Management on discharge. 2. Ensure that biopsy results are sent to Dr. Burr Medico, oncologist that saw patient in the hospital.  She noted that she would set up outpatient f/u with him when these results are back. 3. Follow-up on core biopsy results  Significant Procedures:  3/16-lymph node biopsy performed  Significant Labs and Imaging:  Recent Labs  Lab 11/08/19 0238 11/09/19 1104 11/10/19 0633  WBC 8.3 13.2* 9.1  HGB 11.4* 11.8* 11.7*  HCT 36.0* 37.2* 37.0*  PLT 322 328 333   Recent Labs  Lab 11/07/19 1035 11/08/19 0238  NA 142 139  K 5.1 3.7  CL 106 105  CO2 23 25  GLUCOSE 109* 127*  BUN 10 14  CREATININE 1.11 1.03  CALCIUM 9.5 8.8*   PT-12.5 INR-0.9 HIV-negative RPR-nonreactive   Results/Tests Pending at Time of Discharge: Core biopsy results  Discharge Medications:  Allergies as of 11/11/2019   No Known Allergies     Medication List    TAKE these medications   aspirin 81 MG chewable tablet Chew 1 tablet (81 mg total) by mouth daily.   lisinopril 10 MG tablet Commonly known as: ZESTRIL Take 1 tablet (10 mg total) by mouth daily.   metoprolol tartrate 25 MG tablet Commonly  known as: LOPRESSOR Take 1 tablet (25 mg total) by mouth 2 (two) times daily.   traMADol 50 MG tablet Commonly known as: ULTRAM Take 1 tablet (50 mg total) by mouth every 6 (six) hours as needed for up to 10 days for severe pain.       Discharge Instructions: Please refer to Patient Instructions section of EMR for full details.  Patient was counseled important  signs and symptoms that should prompt return to medical care, changes in medications, dietary instructions, activity restrictions, and follow up appointments.   Follow-Up Appointments: Follow-up Information    Patriciaann Clan, DO Follow up on 11/19/2019.   Specialty: Family Medicine Why: At 2:30 PM Contact information: [id number] [place] [address] [phone]           Gifford Shave, MD 11/12/2019, 4:15 PM PGY-1, [place]

## 2019-11-11 NOTE — Discharge Instructions (Signed)
Thank you for allowing [place] to participate in your care!    You were admitted for a penile lesion.  You had a biopsy collected by interventional radiology.  Those results have not come back yet but when they do the oncologist will be in touch with you to schedule a follow-up appointment.  I am going to give you pain medications to help you until your appointment with oncology.  I have prescribed 50 mg tramadol every 6 hours as needed for pain for up to 10 days.  Please do not take these unless you absolutely have to.  I would also like for you to take Tylenol 650 mg as needed for pain.  I have scheduled a follow-up appointment for you on 11/19/2019 at 2:30 PM.  You will also be contacted by our chronic care management team to help with your medical management.  If you experience worsening of your admission symptoms, develop shortness of breath, life threatening emergency, suicidal or homicidal thoughts you must seek medical attention immediately by calling 911 or calling your MD immediately  if symptoms less severe.

## 2019-11-11 NOTE — Plan of Care (Signed)

## 2019-11-11 NOTE — Progress Notes (Signed)
AVS given and reviewed with pt's daughter, Georga Bora. Medications discussed. All questions answered to satisfaction. Pt's daughter verbalized understanding of information given. Pt to be escorted off the unit with all belongings via wheelchair by staff member.

## 2019-11-11 NOTE — Progress Notes (Signed)
[place] CSW Progress Notes  Email notification from inpatient CSW [facility] [place] - patient will need help w transportation to [facility] appts.  Grace Blight, Transportation Coordinator notified.  No appts in chart at this time - rides can be scheduled as needed when appointments are made.  Edwyna Shell, LCSW Clinical Social Worker Phone:  [phone]

## 2019-11-11 NOTE — Progress Notes (Signed)
Family Medicine Teaching Service Daily Progress Note Intern Pager: [phone]  Patient name: Norman Mendez Medical record number: [id number] Date of birth: 1946-05-17 Age: 74 y.o. Gender: male  Primary Care Provider: Patriciaann Clan, DO Consultants: Urology Code Status: Full  Pt Overview and Major Events to Date:  3/12-patient admitted for penile pain  Assessment and Plan:  Norman Mendez is a 74 y.o. male presenting with chronic penile pain. PMH is significant for hypertension, HLD, mild cognitive impairment.  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 Will precede with IR US-guided inguinal node biopsy while inpatient and follow up with urology/oncology outpatient per specialist recommendations.  Patient has no issues with urinary retention today.  Patient does have issues with pain control.  Patient has as needed tramadol ordered but did not use a dose this morning.  Last dose was last night at around 11 PM - urology OP - oncology OP - f/u IR for biopsy today - continue scheduled tylenol  - strict I/Os -Rapid HIV screen was nonreactive -As needed tramadol 25 mg every 6 hours  Cognitive impairment On evaluation patient is alert and oriented x3 -Delirium precautions  Hypertension- well controlled on home medications. -Continue metoprolol, lisinopril -Monitor blood pressures  Normocytic anemia- stable at 11.4 yesterday with no signs of active bleeding. Will recheck CBC tomorrow -Morning CBC  Hyperlipidemia Patient has no recent lipid panel.  Most recent lipid panel was from 2016. -Lipid panel outpatient  FEN/GI: Heart healthy diet PPx: Lovenox  Disposition: Today   Subjective:  Patient reports he is doing okay this morning but is having issues with pain.  He rates his pain a 9 out of 10 on the pain scale.  Objective: Temp:  [98 F (36.7 C)-98.3 F (36.8 C)] 98.3 F (36.8 C) (03/16 0534) Pulse  Rate:  [80-91] 91 (03/16 0534) Resp:  [16-18] 16 (03/16 0534) BP: (117-167)/(67-81) 141/72 (03/16 0534) SpO2:  [99 %-100 %] 100 % (03/16 0534) Physical Exam: General: Is sitting up in bed when we enter the room.  He says that he is to use the restroom.  Rates his pain 9 out of 10 on pain scale because he has not received any pain medicine overnight Cardiovascular: Regular rate and rhythm, no murmurs appreciated Respiratory: Clear to auscultation bilaterally, normal work of breathing Abdomen: Soft, nontender, positive bowel sounds  Extremities: No edema noted  Laboratory: Recent Labs  Lab 11/08/19 0238 11/09/19 1104 11/10/19 0633  WBC 8.3 13.2* 9.1  HGB 11.4* 11.8* 11.7*  HCT 36.0* 37.2* 37.0*  PLT 322 328 333   Recent Labs  Lab 11/07/19 1035 11/08/19 0238  NA 142 139  K 5.1 3.7  CL 106 105  CO2 23 25  BUN 10 14  CREATININE 1.11 1.03  CALCIUM 9.5 8.8*  GLUCOSE 109* 127*  UA -Small hemoglobin -Negative ketones -Moderate leukocytes -Nitrite negative -Few bacteria present -Squamous epithelium 0-5 -WBC 21-50  Imaging/Diagnostic Tests: No results found.  Gifford Shave, MD 11/11/2019, 5:55 AM PGY-1, [place] Intern pager: [phone], text pages welcome

## 2019-11-11 NOTE — TOC Initial Note (Signed)
Transition of Care [facility]) - Initial/Assessment Note    Patient Details  Name: Norman Mendez MRN: [id number] Date of Birth: 02-06-1946  Transition of Care [facility]) CM/SW Contact:    Alexander Mt, LCSW Phone Number: 11/11/2019, 2:39 PM  Clinical Narrative:                 CSW spoke with pt at bedside. Introduced self, role, reason for visit. Pt from home [facility]) where he states he has lived for "awhile". He has a daughter [name] and two sons Trilby Drummer and Kestutis but he couldn't remember Lisa's last name or his sons numbers. Pt uses a cane at home.   He denies any issues with obtaining medications and picks them up at [facility]. We discussed transportation and he states he takes the bus or his daughter can take him. He is accepting of resources. CSW also sent secure email to CSW's in [place] and [place] to see what assistance programs we can assist pt with for his f/u appointments.   Information systems manager and RN Steffanie Dunn will place in [employer].   Expected Discharge Plan: Home/Self Care Barriers to Discharge: Barriers Resolved   Patient Goals and CMS Choice Patient states their goals for this hospitalization and ongoing recovery are:: get home CMS Medicare.gov Compare Post Acute Care list provided to:: (n/a) Choice offered to / list presented to : Patient  Expected Discharge Plan and Services Expected Discharge Plan: Home/Self Care In-house Referral: Clinical Social Work Discharge Planning Services: CM Consult Post Acute Care Choice: NA Living arrangements for the past 2 months: Hotel/Motel Expected Discharge Date: 11/11/19      Prior Living Arrangements/Services Living arrangements for the past 2 months: Hotel/Motel Lives with:: Self Patient language and need for interpreter reviewed:: Yes(no needs) Do you feel safe going back to the place where you live?: Yes      Need for Family Participation in Patient Care: Yes (Comment)(assistance with  transport/daily cares) Care giver support system in place?: Yes (comment)(pt daughter) Current home services: DME Criminal Activity/Legal Involvement Pertinent to Current Situation/Hospitalization: No - Comment as needed  Activities of Daily Living Home Assistive Devices/Equipment: Cane (specify quad or straight) ADL Screening (condition at time of admission) Patient's cognitive ability adequate to safely complete daily activities?: Yes Is the patient deaf or have difficulty hearing?: No Does the patient have difficulty seeing, even when wearing glasses/contacts?: No Does the patient have difficulty concentrating, remembering, or making decisions?: Yes Patient able to express need for assistance with ADLs?: Yes Does the patient have difficulty dressing or bathing?: No Independently performs ADLs?: Yes (appropriate for developmental age) Does the patient have difficulty walking or climbing stairs?: Yes Weakness of Legs: Both Weakness of Arms/Hands: None  Permission Sought/Granted Permission sought to share information with : Family Supports Permission granted to share information with : Yes, Verbal Permission Granted  Share Information with NAME: [name]  Permission granted to share info w AGENCY: Family Medicine  Permission granted to share info w Relationship: daughter     Emotional Assessment Appearance:: Appears stated age Attitude/Demeanor/Rapport: Engaged Affect (typically observed): Blunt, Frustrated, Adaptable Orientation: : Oriented to Self, Oriented to Place, Oriented to  Time, Oriented to Situation, Fluctuating Orientation (Suspected and/or reported Sundowners) Alcohol / Substance Use: Not Applicable Psych Involvement: (n/a at this time)  Admission diagnosis:  Lymphadenopathy [R59.1] Penile pain [N48.89] Retroperitoneal lymphadenopathy [R59.0] Patient Active Problem List   Diagnosis Date Noted  . 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 Retroperitoneal lymphadenopathy  11/07/2019  . Penile mass 11/07/2019  . Cognitive impairment 10/21/2014  . Essential hypertension 09/11/2014  . Hyperlipidemia 03/18/2014   PCP:  Patriciaann Clan, DO Pharmacy:   [facility] [place], [place] - [address] [place] [address] Phone: [phone] Fax: [phone]   Readmission Risk Interventions Readmission Risk Prevention Plan 11/11/2019  Post Dischage Appt Complete  Medication Screening Complete  Transportation Screening Complete  Some recent data might be hidden

## 2019-11-12 ENCOUNTER — Ambulatory Visit: Payer: Medicare Other | Admitting: Family Medicine

## 2019-11-13 ENCOUNTER — Telehealth: Payer: Self-pay | Admitting: Oncology

## 2019-11-13 LAB — SURGICAL PATHOLOGY

## 2019-11-13 NOTE — Telephone Encounter (Signed)
Received a new referral from the hospital for new dx of penile cancer. I cld [name] daughter and scheduled him to see Dr. Alen Blew on 3/31 at 11am. She's been made aware to arrive 15 minutes early/

## 2019-11-14 ENCOUNTER — Telehealth: Payer: Self-pay | Admitting: Family Medicine

## 2019-11-14 NOTE — Telephone Encounter (Signed)
Called patient's daughter to discuss biopsy results, she is his point of contact as he does not have a direct number within his chart.  Reviewed her understanding of his recent hospital stay, reports she was aware that there was concerns that this was penile cancer.  Discussed results of lymph node biopsy including metastatic carcinoma with squamous differentiation.  She reports he has an oncology appointment scheduled for 3/31.  Reiterated the importance that he make it to this appt.  Answered all questions she had at this time.  Reminded her of his follow-up with myself on 3/24.  Patriciaann Clan, DO

## 2019-11-18 ENCOUNTER — Telehealth: Payer: Self-pay | Admitting: Family Medicine

## 2019-11-18 NOTE — Chronic Care Management (AMB) (Signed)
  Chronic Care Management   Note  11/18/2019 Name: Norman Mendez MRN: [id number] DOB: [birth year]  Norman Mendez is a 74 y.o. year old male who is a primary care patient of Patriciaann Clan, DO. I reached out to Brendolyn Patty by phone today in response to a referral sent by [name]'s PCP, Darrelyn Hillock DO     [name] was given information about Chronic Care Management services today including:  1. CCM service includes personalized support from designated clinical staff supervised by his physician, including individualized plan of care and coordination with other care providers 2. 24/7 contact phone numbers for assistance for urgent and routine care needs. 3. Service will only be billed when office clinical staff spend 20 minutes or more in a month to coordinate care. 4. Only one practitioner may furnish and bill the service in a calendar month. 5. The patient may stop CCM services at any time (effective at the end of the month) by phone call to the office staff. 6. The patient will be responsible for cost sharing (co-pay) of up to 20% of the service fee (after annual deductible is met).  Patient's daughter did not agree to enrollment in care management services and does not wish to consider at this time.  Follow up plan: The care management team is available to follow up with the patient after provider conversation with the patient regarding recommendation for care management engagement and subsequent re-referral to the care management team.   Glenna Durand, LPN Health Advisor, [name] ??[email or web address] ??[phone]

## 2019-11-19 ENCOUNTER — Ambulatory Visit (INDEPENDENT_AMBULATORY_CARE_PROVIDER_SITE_OTHER): Payer: Medicare Other | Admitting: Family Medicine

## 2019-11-19 ENCOUNTER — Other Ambulatory Visit: Payer: Self-pay

## 2019-11-19 ENCOUNTER — Encounter: Payer: Self-pay | Admitting: Family Medicine

## 2019-11-19 VITALS — BP 118/68 | HR 94 | Ht 66.0 in | Wt 141.0 lb

## 2019-11-19 DIAGNOSIS — I10 Essential (primary) hypertension: Secondary | ICD-10-CM

## 2019-11-19 DIAGNOSIS — R2689 Other abnormalities of gait and mobility: Secondary | ICD-10-CM

## 2019-11-19 DIAGNOSIS — E785 Hyperlipidemia, unspecified: Secondary | ICD-10-CM

## 2019-11-19 DIAGNOSIS — R2681 Unsteadiness on feet: Secondary | ICD-10-CM

## 2019-11-19 DIAGNOSIS — Z5900 Homelessness unspecified: Secondary | ICD-10-CM

## 2019-11-19 DIAGNOSIS — C609 Malignant neoplasm of penis, unspecified: Secondary | ICD-10-CM

## 2019-11-19 NOTE — Patient Instructions (Addendum)
It was wonderful to see you today.  It is very important that you follow-up with Dr. Burr Medico with oncology at the end of this month.  I will speak with our social worker, Neoma Laming, to help see if there is any housing assistance that we can provide.  Additionally I placed orders to get a walker and shower chair--it may take several weeks to see if this can be covered.  I recommend that he get a pillbox at the pharmacy so that you can place his medicines in there at beginning of every week but he is not doubling up on medications.  If he is unable to urinate, has severe abdominal pain, or intolerable penile pain despite medications please make sure he is seen right away.  In addition to the tramadol, I recommend using Tylenol 500 mg every 4-6 hours as needed.   Housing Resources: [employer]:   [email or web address]  [phone] [place], [place], [place] [address]  . [place] Housing Medical sales representative.[email or web address]   [phone] . [place]:  [phone]     [place], [place], [place]  [address]    [place]:  [phone]     [place], [place], [place] [address]   . Affordable Housing Management:  [phone]  [email or web address]  [address] [place] [address] [place],  [address] . Homeless: Museum/gallery curator [facility]) [phone]   [address]   . [employer]: [place] By appointment only - Call [phone]  . [employer] ([place])   [place] [phone] . [place]  [place] [phone] . [place]  [phone]

## 2019-11-19 NOTE — Progress Notes (Signed)
SUBJECTIVE:   CHIEF COMPLAINT / HPI: Hospital follow-up  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  Patient was known to have inguinal lymphadenopathy since hospitalization in 07/2019, attempted to schedule outpatient IR biopsy of these regions, however through difficulties in reaching the patient and scheduling through IR this was not completed.  MRI during hospitalization on 3/13 showing a mass within the distal penile shaft and multifocal penile metastases in the proximal/mid shaft, right inguinal region, bilateral external and common iliac nodes.  Core biopsy performed on 3/16 with metastatic poorly differentiated carcinoma with squamous differentiation and lymphovascular invasion.    Through discussion with his daughter on 3/19 pertaining biopsy results, states they already have an oncology appointment scheduled for 3/31.  CCM consulted to help with coordination, however patient's daughter declined services.  Today, he reports he still has significant penile pain.  Accompanied by his daughter.  Never received tramadol at the pharmacy, pharmacy stated they did not receive this order.  Urinating without concern.  He requests a walker to help him walk better and shower chair.  Still living in a hotel.  Brought his medications into the office today, it appears he has been taking at least 2 tablets of atorvastatin 80 mg daily and does not have lisinopril at home.  He has also been taking metoprolol 2 tablets in the morning rather than am/pm.     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  Oncology appointment scheduled for 3/31, to discuss treatment options at that time.  Unfortunately had pharmacy difficulties receiving his tramadol, will call [facility] to make sure this medication was received.  ED precautions discussed including urinary retention, severe abdominal/penile pain not relieved with analgesics.  Essential hypertension Appears well controlled on Coreg only, has not been taking lisinopril.  Will continue with this regimen as is, would rather his blood pressure be on the higher side due to mildly unsteady gait and current opioid use.  Hyperlipidemia Unfortunately has been taking 2-3 atorvastatin tablets daily due to having multiple bottles.  Will obtain CMP today to evaluate LFTs.  Additionally discussed with patient and daughter on starting a pillbox to ensure appropriate daily medications.   Homelessness Currently living in a hotel by himself. Provided with housing resources and referred to our social worker, Neoma Laming, for further assistance in obtaining more stable housing.  Need for assistance due to unsteady gait Chronic.  Place a DME order for rolling walker and shower chair.     Follow-up in 1 month or sooner if needed.  Daughter present for duration of visit, updated on plan of action.  Update: Spoke with [facility] and daughter, patient was able to pick up his tramadol.  Counseled on safe use of this medication.  Patriciaann Clan, [place]

## 2019-11-20 ENCOUNTER — Telehealth: Payer: Self-pay | Admitting: *Deleted

## 2019-11-20 LAB — COMPREHENSIVE METABOLIC PANEL
ALT: 30 IU/L (ref 0–44)
AST: 22 IU/L (ref 0–40)
Albumin/Globulin Ratio: 0.9 — ABNORMAL LOW (ref 1.2–2.2)
Albumin: 3.5 g/dL — ABNORMAL LOW (ref 3.7–4.7)
Alkaline Phosphatase: 128 IU/L — ABNORMAL HIGH (ref 39–117)
BUN/Creatinine Ratio: 9 — ABNORMAL LOW (ref 10–24)
BUN: 10 mg/dL (ref 8–27)
Bilirubin Total: 0.2 mg/dL (ref 0.0–1.2)
CO2: 25 mmol/L (ref 20–29)
Calcium: 9.2 mg/dL (ref 8.6–10.2)
Chloride: 106 mmol/L (ref 96–106)
Creatinine, Ser: 1.06 mg/dL (ref 0.76–1.27)
GFR calc Af Amer: 80 mL/min/{1.73_m2} (ref >59)
GFR calc non Af Amer: 69 mL/min/{1.73_m2} (ref >59)
Globulin, Total: 3.7 g/dL (ref 1.5–4.5)
Glucose: 98 mg/dL (ref 65–99)
Potassium: 4.4 mmol/L (ref 3.5–5.2)
Sodium: 141 mmol/L (ref 134–144)
Total Protein: 7.2 g/dL (ref 6.0–8.5)

## 2019-11-20 NOTE — Telephone Encounter (Signed)
Community message sent to [employer] and Darlina Guys @ [facility] to process DME order for Shower Chair and rolling walker.   Christen Bame, CMA

## 2019-11-21 ENCOUNTER — Ambulatory Visit: Payer: Self-pay | Admitting: Licensed Clinical Social Worker

## 2019-11-21 ENCOUNTER — Telehealth: Payer: Self-pay | Admitting: Family Medicine

## 2019-11-21 DIAGNOSIS — Z5900 Homelessness unspecified: Secondary | ICD-10-CM

## 2019-11-21 NOTE — Chronic Care Management (AMB) (Signed)
   Social Work Care Management  Referral Note  11/21/2019 Name: Norman Mendez MRN: [id number] DOB: May 22, 1946  Norman Mendez is a 74 y.o. year old male who sees Patriciaann Clan, DO for primary care.  LCSW was consulted by PCP to assistance patient with [employer] for housing.  Patient and daughter have declined ongoing CCM support.   Referral Priority: Routine (anticipate clinician appointment scheduling within 28 business days) Review of patient status, including review of consultants reports, relevant laboratory and other test results, as well as collaboration with appropriate care team members,  and the patient's provider was performed as part of comprehensive patient evaluation and provision of chronic care management services.    Recommendation: LCSW reviewed referral and based on the chart review determined no clinical needs are identified for this patient.   Patient's housing needs are best met by CCM care guides.   Patient may also benefit from transportation support to upcoming appointment at the cancer center.  Plan:  1. Patient is being referred to the Care Guide for assistance with community resources for housing.  2.  The Care Guide will contact Care Management team if clinical needs are identified  3.   Message sent to Lelon Frohlich, Social Worker at the [employer] to see if she is able to assist patient with transportation to [facility] appointment March 31st  4.   No further encounters scheduled with LCSW at this time  Casimer Lanius, [place] / [place]   [phone] 10:54 AM

## 2019-11-21 NOTE — Telephone Encounter (Signed)
Closing referral pending any other needs of patient.  [place], Care Guide

## 2019-11-21 NOTE — Telephone Encounter (Signed)
Email to pt's daughter  From: Jill Alexanders [facility])  Sent: Friday, November 21, 2019 2:48 PM To: '[email or web address]> Subject: Secure: Housing Resources for [place] [facility]  Good Afternoon Ms. Howze,  Thank you for speaking with me today in regards to housing resources for your father [name]. I have attached a few options that may be suitable for your father with his SSI monthly payment. Like I stated on the phone, the [employer] most likely has a lengthy waitlist so Id encourage you to reach out to them soon so that you can go ahead and get him on the list.    Please let me know if you have any further needs.  Take good care,  [email or web address]   HA:[id number]

## 2019-11-21 NOTE — Telephone Encounter (Signed)
Received and being processed now by [facility]. Christen Bame, CMA

## 2019-11-21 NOTE — Telephone Encounter (Signed)
   SF 11/21/2019   Name: Johnathyn Cassone   MRN: [id number]   DOB: [birth year]   AGE: 74 y.o.   GENDER: male   PCP Patriciaann Clan, DO.   Called pt's daughter [name] regarding [employer] Referral for housing. [name] stated that she does not know a whole. Someone found Mr. Depner sleeping under a trailer and found Lisa's number in his phone and that is when she cam to get him. [name] stated that they have tried many different resources for housing but not in his price range. Mr. Biter receives [phone] Social Security a month. He is currently living at [facility] and pays $600/month. Only has $300 left for living expenses. Patient's daughter is concerned about the safety of the hotel he is living in.  Patient's daughter noted:  Mr. Dargenio is not taking his medication properly, he was taking the wrong dosage of medication, she has to separate his medication to help get him back on track. She purchased a pill Environmental education officer. Daughter also mention a referral for a walker and a shower chair. Did not see in Epic where referral had been placed.   Care Guide will send patient's daughter resources via e-mail.   [place], Care Management Phone: [phone] Email: [email or web address]

## 2019-11-21 NOTE — Telephone Encounter (Signed)
° °  KNB 11/21/2019   Name: Norman Mendez    MRN: [id number]    DOB: 1946-02-24    AGE: 74 y.o.    GENDER: male    PCP Patriciaann Clan, DO.   Called pt's daughter regarding Community Resource Referral for Housing resources she was at work and not available for a discussion asked that we call back this afternoon.  follow up on: 11/21/2019  @1 :[place] [facility] [email or web address]   [id number]

## 2019-11-22 ENCOUNTER — Encounter (HOSPITAL_COMMUNITY): Payer: Self-pay | Admitting: Emergency Medicine

## 2019-11-22 ENCOUNTER — Emergency Department (HOSPITAL_COMMUNITY)
Admission: EM | Admit: 2019-11-22 | Discharge: 2019-11-22 | Disposition: A | Discharge status: Home / Self Care | Payer: Medicare Other | Attending: Emergency Medicine | Admitting: Emergency Medicine

## 2019-11-22 ENCOUNTER — Other Ambulatory Visit: Payer: Self-pay

## 2019-11-22 DIAGNOSIS — Z7982 Long term (current) use of aspirin: Secondary | ICD-10-CM | POA: Diagnosis not present

## 2019-11-22 DIAGNOSIS — I10 Essential (primary) hypertension: Secondary | ICD-10-CM | POA: Diagnosis not present

## 2019-11-22 DIAGNOSIS — C609 Malignant neoplasm of penis, unspecified: Secondary | ICD-10-CM | POA: Diagnosis not present

## 2019-11-22 DIAGNOSIS — Z87891 Personal history of nicotine dependence: Secondary | ICD-10-CM | POA: Diagnosis not present

## 2019-11-22 DIAGNOSIS — I252 Old myocardial infarction: Secondary | ICD-10-CM | POA: Diagnosis not present

## 2019-11-22 DIAGNOSIS — Z79899 Other long term (current) drug therapy: Secondary | ICD-10-CM | POA: Insufficient documentation

## 2019-11-22 DIAGNOSIS — N4889 Other specified disorders of penis: Secondary | ICD-10-CM | POA: Insufficient documentation

## 2019-11-22 MED ORDER — POLYETHYLENE GLYCOL 3350 17 G PO PACK: 17.0000 g | PACK | Freq: Every day | ORAL | 0 refills | Status: DC

## 2019-11-22 MED ORDER — NYSTATIN 100000 UNIT/GM EX CREA: TOPICAL_CREAM | CUTANEOUS | 0 refills | Status: DC

## 2019-11-22 MED ORDER — OXYCODONE-ACETAMINOPHEN 5-325 MG PO TABS
1.0000 | ORAL_TABLET | Freq: Once | ORAL | Status: AC
  Administered 2019-11-22: 1 via ORAL
  Filled 2019-11-22: qty 1

## 2019-11-22 MED ORDER — DOCUSATE SODIUM 100 MG PO CAPS: 100.0000 mg | ORAL_CAPSULE | Freq: Two times a day (BID) | ORAL | 0 refills | Status: DC

## 2019-11-22 MED ORDER — OXYCODONE-ACETAMINOPHEN 5-325 MG PO TABS: 1.0000 | ORAL_TABLET | Freq: Four times a day (QID) | ORAL | 0 refills | Status: DC | PRN

## 2019-11-22 NOTE — ED Triage Notes (Signed)
Pt to triage via PTAR from home.  C/o penile discharge and pain x 2 weeks.

## 2019-11-22 NOTE — Discharge Instructions (Addendum)
1.  You have an appointment on 3\31\2020 with your cancer specialist to discuss treatment options for your penile cancer. 2.  You have been given a prescription for Percocet to help with pain until a further plan is made for your treatment.  You may take 1 to 2 tablets every 6 hours for pain. 3.  Percocet can cause constipation.  To avoid constipation, take Colace (a stool softener) 2 times a day.  Take MiraLAX once a day.  If your stool is too loose, you can discontinue the MiraLAX and only take it if you have not had a bowel movement every 2 to 3 days. 4.  Your pain is due to your penile cancer.  You have developed an ulcer that has some whitish-yellow material on it.  This is most likely due to the cancer.  This does not appear likely to be a different type of infection.  However, yeast infections can develop in uncircumcised males.  So, you have been given an antifungal cream to apply to the tip of the penis.

## 2019-11-22 NOTE — ED Provider Notes (Addendum)
[facility] EMERGENCY DEPARTMENT Provider Note   CSN: [id number] Arrival date & time: 11/22/19  1417     History Chief Complaint  Patient presents with  . Penis Pain    Norman Mendez is a 74 y.o. male.  HPI Patient has persisting and worsening pain in his penis.  He reports he also now has some drainage and more pain at the tip of the penis.  He denies he is having trouble urinating.  No abdominal pain.  No fever no chills.  He has been given tramadol to take for pain but it is not helping.  He reports he is not having any difficulty walking or doing any of his other usual activities of daily living.  He is living independently.  I also spoke with the patient's daughter over the phone.  He reports that the patient has a girlfriend who helps him at home.  She checks in on him every other day.  She arranges pills for him.  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    Past Medical History:  Diagnosis Date  . 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  Marland Kitchen Acute UTI 08/27/2019  . 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 Complicated UTI (urinary tract infection) 08/28/2019  . CVA (cerebral infarction)   . Dysarthria due to cerebrovascular accident   . Hyperlipemia   . Hypertension   . Lymphadenopathy   . NSTEMI (non-ST elevated myocardial infarction) ([place])   . Penile pain 11/07/2019  . Shortness of breath dyspnea   . Sinus tachycardia 11/16/2011    Patient Active Problem List   Diagnosis Date Noted  . 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 Inguinal lymphadenopathy 11/07/2019  . Penile mass 11/07/2019  . Cognitive impairment 10/21/2014  . Essential hypertension 09/11/2014  . Hyperlipidemia 03/18/2014    Past Surgical History:  Procedure Laterality Date  . LEFT HEART CATHETERIZATION WITH CORONARY ANGIOGRAM N/A  03/18/2014   Procedure: LEFT HEART CATHETERIZATION WITH CORONARY ANGIOGRAM;  Surgeon: Sinclair Grooms, MD;  Location: [facility] CATH LAB;  Service: Cardiovascular;  Laterality: N/A;  . No previous surgery         Family History  Problem Relation Age of Onset  . Heart disease Other        No family history    Social History   Tobacco Use  . Smoking status: Former Smoker    Packs/day: 0.20    Types: Cigarettes    Quit date: 08/18/2014    Years since quitting: 5.2  . Smokeless tobacco: Never Used  Substance Use Topics  . Alcohol use: No    Comment: 3 40 ounce beer per week  . Drug use: No    Home Medications Prior to Admission medications   Medication Sig Start Date End Date Taking? Authorizing Provider  aspirin 81 MG chewable tablet Chew 1 tablet (81 mg total) by mouth daily. 12/31/18   Bufford Lope, DO  docusate sodium (COLACE) 100 MG capsule Take 1 capsule (100 mg total) by mouth every 12 (twelve) hours. 11/22/19   Charlesetta Shanks, MD  lisinopril (ZESTRIL) 10 MG tablet Take 1 tablet (10 mg total) by mouth daily. Patient not taking: Reported on 11/08/2019 12/31/18   Bufford Lope, DO  metoprolol tartrate (LOPRESSOR) 25 MG tablet Take 1 tablet (25 mg total) by mouth 2 (two) times daily. 06/19/19   Higinio Plan,  Samantha N, DO  nystatin cream (MYCOSTATIN) Apply to affected area 2 times daily 11/22/19   Charlesetta Shanks, MD  oxyCODONE-acetaminophen (PERCOCET) 5-325 MG tablet Take 1-2 tablets by mouth every 6 (six) hours as needed. 11/22/19   Charlesetta Shanks, MD  polyethylene glycol (MIRALAX / GLYCOLAX) 17 g packet Take 17 g by mouth daily. 11/22/19   Charlesetta Shanks, MD    Allergies    Patient has no known allergies.  Review of Systems   Review of Systems 10 Systems reviewed and are negative for acute change except as noted in the HPI.  Physical Exam Updated Vital Signs BP (!) 163/101 (BP Location: Right Arm)   Pulse 92   Temp 98.5 F (36.9 C) (Oral)   Resp 16   SpO2 100%   Physical  Exam Constitutional:      Appearance: Normal appearance.  Cardiovascular:     Rate and Rhythm: Normal rate and regular rhythm.  Pulmonary:     Effort: Pulmonary effort is normal.     Breath sounds: Normal breath sounds.  Abdominal:     General: There is no distension.     Palpations: Abdomen is soft.     Tenderness: There is no abdominal tenderness. There is no guarding.     Comments: Abdomen is nontender.  No suprapubic fullness or pain to palpation.  Genitourinary:    Comments: Patient has inguinal lymphadenopathy in the right inguinal area.  This is nontender.  Moderate enlargement.  Skin surfaces over the hairbearing area and inguinal regions are normal.  No rashes or lesions.  Patient has uncircumcised penis.  With partial retraction of the prepuce, the glans does have a ulcerative lesion with fibrinous overlying material that is approximately 1 cm in size.  Urethral meatus remains patent.  Patient has firm, nodular mass within the shaft of the penis and a nodule to the base of the penis.  The skin surfaces aside from the glands are normal.  No scrotal edema.  Scrotum is nontender. Musculoskeletal:        General: Normal range of motion.     Comments: Patient is up and ambulatory without difficulty.  He can walk around the room.  He does not have peripheral edema.  Calves are soft and nontender.  Skin:    General: Skin is warm and dry.  Neurological:     General: No focal deficit present.     Mental Status: He is alert and oriented to person, place, and time.     Coordination: Coordination normal.  Psychiatric:        Mood and Affect: Mood normal.     ED Results / Procedures / Treatments   Labs (all labs ordered are listed, but only abnormal results are displayed) Labs Reviewed - No data to display  EKG None  Radiology No results found.  Procedures Procedures (including critical care time)  Medications Ordered in ED Medications  oxyCODONE-acetaminophen  (PERCOCET/ROXICET) 5-325 MG per tablet 1 tablet (1 tablet Oral Given 11/22/19 1625)    ED Course  I have reviewed the triage vital signs and the nursing notes.  Pertinent labs & imaging results that were available during my care of the patient were reviewed by me and considered in my medical decision making (see chart for details).    MDM Rules/Calculators/A&P                      Consult: Reviewed with Dr. Gloriann Loan of urology.  At this time he  agrees with plan of increasing pain management attempt and proceeding with the outpatient follow-up.  Patient presents with pain in the penis.  He has likely a primary cancer of the penis.  His exam has advanced clinically since I first saw the patient.  He now has an ulcerative lesion with fibrinous overlying material on the glans of the penis.  Although I suspect this is likely due to the underlying cancer, will initiate nystatin cream empirically.  His PCP has been trying outpatient pain management.  Patient has taken tramadol without relief.  At this time, will change to Percocet 1 to 2 tablets every 6 hours to try to improve pain control.  Patient is counseled on necessity of taking stool softeners and laxatives to avoid constipation while taking narcotics.  Patient denies any difficulty passing urine.  He does not have suprapubic fullness or tenderness.  Review of systems is otherwise negative.  Clinically patient otherwise appears well.  He is up and ambulatory.  His speech is clear.  His ADLs do not appear to be impacted.  Patient will be seen for long-term management planning within the next 4 days.  I have reviewed this with his daughter who is assisting in his care and prepared to go with him for this meeting. Final Clinical Impression(s) / ED Diagnoses Final diagnoses:  Penile pain  Penile cancer ([place])    Rx / DC Orders ED Discharge Orders         Ordered    oxyCODONE-acetaminophen (PERCOCET) 5-325 MG tablet  Every 6 hours PRN     11/22/19 1654     docusate sodium (COLACE) 100 MG capsule  Every 12 hours     11/22/19 1654    polyethylene glycol (MIRALAX / GLYCOLAX) 17 g packet  Daily     11/22/19 1654    nystatin cream (MYCOSTATIN)     11/22/19 1703           Charlesetta Shanks, MD 11/22/19 1712    Charlesetta Shanks, MD 11/22/19 1713

## 2019-11-22 NOTE — ED Notes (Signed)
Pt out in lobby grabbing and rubbing on his penis. multiple times.

## 2019-11-23 ENCOUNTER — Encounter: Payer: Self-pay | Admitting: Family Medicine

## 2019-11-23 DIAGNOSIS — R2689 Other abnormalities of gait and mobility: Secondary | ICD-10-CM | POA: Insufficient documentation

## 2019-11-23 DIAGNOSIS — Z5900 Homelessness unspecified: Secondary | ICD-10-CM | POA: Insufficient documentation

## 2019-11-23 DIAGNOSIS — C609 Malignant neoplasm of penis, unspecified: Secondary | ICD-10-CM | POA: Insufficient documentation

## 2019-11-23 NOTE — Assessment & Plan Note (Signed)
Unfortunately has been taking 2-3 atorvastatin tablets daily due to having multiple bottles.  Will obtain CMP today to evaluate LFTs.  Additionally discussed with patient and daughter on starting a pillbox to ensure appropriate daily medications.

## 2019-11-23 NOTE — Assessment & Plan Note (Signed)
Currently living in a hotel by himself. Provided with housing resources and referred to our social worker, Neoma Laming, for further assistance in obtaining more stable housing.

## 2019-11-23 NOTE — Assessment & Plan Note (Signed)
Chronic.  Place a DME order for rolling walker and shower chair.

## 2019-11-23 NOTE — Assessment & Plan Note (Signed)
Appears well controlled on Coreg only, has not been taking lisinopril.  Will continue with this regimen as is, would rather his blood pressure be on the higher side due to mildly unsteady gait and current opioid use.

## 2019-11-23 NOTE — Assessment & Plan Note (Signed)
Poorly differentiated squamous carcinoma, associated with multifocal penile metastasis within shaft and inguinal/iliac nodes bilaterally.  Oncology appointment scheduled for 3/31, to discuss treatment options at that time.  Unfortunately had pharmacy difficulties receiving his tramadol, will call [facility] to make sure this medication was received.  ED precautions discussed including urinary retention, severe abdominal/penile pain not relieved with analgesics.

## 2019-11-26 ENCOUNTER — Inpatient Hospital Stay: Payer: Medicare Other | Attending: Oncology | Admitting: Oncology

## 2019-11-26 ENCOUNTER — Other Ambulatory Visit: Payer: Self-pay

## 2019-11-26 VITALS — BP 149/73 | HR 101 | Temp 98.2°F | Resp 20 | Ht 66.0 in | Wt 140.1 lb

## 2019-11-26 DIAGNOSIS — C609 Malignant neoplasm of penis, unspecified: Secondary | ICD-10-CM

## 2019-11-26 MED ORDER — OXYCODONE-ACETAMINOPHEN 5-325 MG PO TABS: 1.0000 | ORAL_TABLET | Freq: Four times a day (QID) | ORAL | 0 refills | Status: DC | PRN

## 2019-11-26 NOTE — Progress Notes (Signed)
Reason for the request:    Penile cancer  HPI: I was asked by Dr. Higinio Plan to evaluate 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  He is a 74 year old man hospitalized on March 13 with complaints of penile pain.  At that time imaging studies showed inguinal adenopathy and found to have a penile mass.  He underwent inguinal lymph node biopsy completed on November 11, 2019 which showed poorly differentiated squamous cell carcinoma.  MRI of the pelvis did show a 4.9 x 1.0 x 0.8 mass along the distal penile shaft with no additional multifocal lesion within the corpus cavernosum.  Nodal metastasis was noted bilaterally including right inguinal and bilateral external iliac lymph nodes.  Since his discharge, he continues to have pain that is severe and not manageable with tramadol.  He was seen in the emergency department on November 22, 2019 and was prescribed Percocet which she is taking.  The pain medication have helped temporarily at this time but is requiring more more to control his pain.  This is impacting his quality of life dramatically.  His mobility is adequate and does not report any major changes with urination.    He does not report any headaches, blurry vision, syncope or seizures. Does not report any fevers, chills or sweats.  Does not report any cough, wheezing or hemoptysis.  Does not report any chest pain, palpitation, orthopnea or leg edema.  Does not report any nausea, vomiting or abdominal pain.  Does not report any constipation or diarrhea.  Does not report any skeletal complaints.    Does not report frequency, urgency or hematuria.  Does not report any skin rashes or lesions. Does not report any heat or cold intolerance.  Does not report any lymphadenopathy or petechiae.  Does not report any anxiety or depression.  Remaining review of systems is negative.    Past Medical History:  Diagnosis Date  . Acromioclavicular joint arthropathy and ganglion cyst  06/03/2016   See note 06/02/2016.  Marland Kitchen Acute UTI 08/27/2019  . 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 Complicated UTI (urinary tract infection) 08/28/2019  . CVA (cerebral infarction)   . Dysarthria due to cerebrovascular accident   . Hyperlipemia   . Hypertension   . Lymphadenopathy   . NSTEMI (non-ST elevated myocardial infarction) ([place])   . Penile pain 11/07/2019  . Shortness of breath dyspnea   . Sinus tachycardia 11/16/2011  :  Past Surgical History:  Procedure Laterality Date  . LEFT HEART CATHETERIZATION WITH CORONARY ANGIOGRAM N/A 03/18/2014   Procedure: LEFT HEART CATHETERIZATION WITH CORONARY ANGIOGRAM;  Surgeon: Sinclair Grooms, MD;  Location: [facility] CATH LAB;  Service: Cardiovascular;  Laterality: N/A;  . No previous surgery    :   Current Outpatient Medications:  .  aspirin 81 MG chewable tablet, Chew 1 tablet (81 mg total) by mouth daily., Disp: 90 tablet, Rfl: 3 .  docusate sodium (COLACE) 100 MG capsule, Take 1 capsule (100 mg total) by mouth every 12 (twelve) hours., Disp: 60 capsule, Rfl: 0 .  lisinopril (ZESTRIL) 10 MG tablet, Take 1 tablet (10 mg total) by mouth daily. (Patient not taking: Reported on 11/08/2019), Disp: 90 tablet, Rfl: 3 .  metoprolol tartrate (LOPRESSOR) 25 MG tablet, Take 1 tablet (25 mg total) by mouth 2 (two) times daily., Disp: 180 tablet, Rfl: 1 .  nystatin cream (MYCOSTATIN), Apply to affected area 2 times daily, Disp: 15 g, Rfl: 0 .  oxyCODONE-acetaminophen (  PERCOCET) 5-325 MG tablet, Take 1-2 tablets by mouth every 6 (six) hours as needed., Disp: 20 tablet, Rfl: 0 .  polyethylene glycol (MIRALAX / GLYCOLAX) 17 g packet, Take 17 g by mouth daily., Disp: 28 each, Rfl: 0:  No Known Allergies:  Family History  Problem Relation Age of Onset  . Heart disease Other        No family history  :  Social History   Socioeconomic History  . Marital status: Single    Spouse name: Not on file  . Number of children: 4  .  Years of education: Not on file  . Highest education level: Not on file  Occupational History    Comment: Retired housekeeper  Tobacco Use  . Smoking status: Former Smoker    Packs/day: 0.20    Types: Cigarettes    Quit date: 08/18/2014    Years since quitting: 5.2  . Smokeless tobacco: Never Used  Substance and Sexual Activity  . Alcohol use: No    Comment: 3 40 ounce beer per week  . Drug use: No  . Sexual activity: Not on file  Other Topics Concern  . Not on file  Social History Narrative  . Not on file   Social Determinants of Health   Financial Resource Strain:   . Difficulty of Paying Living Expenses:   Food Insecurity:   . Worried About Charity fundraiser in the Last Year:   . Arboriculturist in the Last Year:   Transportation Needs:   . Film/video editor (Medical):   Marland Kitchen Lack of Transportation (Non-Medical):   Physical Activity:   . Days of Exercise per Week:   . Minutes of Exercise per Session:   Stress:   . Feeling of Stress :   Social Connections:   . Frequency of Communication with Friends and Family:   . Frequency of Social Gatherings with Friends and Family:   . Attends Religious Services:   . Active Member of Clubs or Organizations:   . Attends Archivist Meetings:   Marland Kitchen Marital Status:   Intimate Partner Violence:   . Fear of Current or Ex-Partner:   . Emotionally Abused:   Marland Kitchen Physically Abused:   . Sexually Abused:   :  Pertinent items are noted in HPI.  Exam: Blood pressure (!) 149/73, pulse (!) 101, temperature 98.2 F (36.8 C), temperature source Oral, resp. rate 20, height 5\' 6"  (1.676 m), weight 140 lb 1.6 oz (63.5 kg), SpO2 100 %.   ECOG 1 General appearance: alert and cooperative appeared without distress. Head: atraumatic without any abnormalities. Eyes: conjunctivae/corneas clear. PERRL.  Sclera anicteric. Throat: lips, mucosa, and tongue normal; without oral thrush or ulcers. Resp: clear to auscultation bilaterally  without rhonchi, wheezes or dullness to percussion. Cardio: regular rate and rhythm, S1, S2 normal, no murmur, click, rub or gallop GI: soft, non-tender; bowel sounds normal; no masses,  no organomegaly Skin: Skin color, texture, turgor normal. No rashes or lesions Lymph nodes: Cervical, supraclavicular, and axillary nodes normal. Neurologic: Grossly normal without any motor, sensory or deep tendon reflexes. Musculoskeletal: No joint deformity or effusion.  CBC    Component Value Date/Time   WBC 9.1 11/10/2019 0633   RBC 4.13 (L) 11/10/2019 0633   HGB 11.7 (L) 11/10/2019 0633   HGB 13.8 09/17/2018 1132   HCT 37.0 (L) 11/10/2019 0633   HCT 42.8 09/17/2018 1132   PLT 333 11/10/2019 0633   PLT 272 09/17/2018 1132  MCV 89.6 11/10/2019 0633   MCV 88 09/17/2018 1132   MCH 28.3 11/10/2019 0633   MCHC 31.6 11/10/2019 0633   RDW 14.4 11/10/2019 0633   RDW 13.1 09/17/2018 1132   LYMPHSABS 2.3 10/20/2014 0438   MONOABS 0.4 10/20/2014 0438   EOSABS 0.2 10/20/2014 0438   BASOSABS 0.0 10/20/2014 0438     Chemistry      Component Value Date/Time   NA 141 11/19/2019 1527   K 4.4 11/19/2019 1527   CL 106 11/19/2019 1527   CO2 25 11/19/2019 1527   BUN 10 11/19/2019 1527   CREATININE 1.06 11/19/2019 1527   CREATININE 1.49 (H) 04/25/2016 0830      Component Value Date/Time   CALCIUM 9.2 11/19/2019 1527   ALKPHOS 128 (H) 11/19/2019 1527   AST 22 11/19/2019 1527   ALT 30 11/19/2019 1527   BILITOT 0.2 11/19/2019 1527       MR PELVIS W WO CONTRAST  Result Date: 11/08/2019 CLINICAL DATA:  Penile pain, known pelvic/retroperitoneal lymphadenopathy, evaluate for primary EXAM: MRI PELVIS WITHOUT AND WITH CONTRAST TECHNIQUE: Multiplanar multisequence MR imaging of the pelvis was performed both before and after administration of intravenous contrast. CONTRAST:  7.45mL GADAVIST GADOBUTROL 1 MMOL/ML IV SOLN COMPARISON:  CT abdomen/pelvis dated 08/27/2019 FINDINGS: Urinary Tract:  Bladder is  within normal limits. Dilated penile urethra, extending to the level of the mid/distal penile shaft, with abrupt cutoff (series 11/image 23). Bowel:  Visualized bowel is unremarkable. Vascular/Lymphatic: No evidence of aneurysm. Pelvic/retroperitoneal lymphadenopathy, including: --Right inguinal nodes measuring up to 2.1 cm short axis (series 12/image 21), previously 1.8 cm --Bilateral external iliac nodes, measuring 2.3 cm on the right and 1.6 cm on the left (series 12/image 15), previously 1.6 cm on the right --1.3 cm short axis right common iliac node (series 10/image 10), grossly unchanged --1.1 cm short axis left common iliac node (series 10/image 5), previously 5 mm Reproductive: 1.0 x 0.8 x 4.9 cm mass along the distal penile shaft extending to the fossa navicularis (series 12/image 17; series 14/image 20). This may involve the right glans penis and likely involves the corpus spongiosum. This mass is also conspicuous following contrast enhancement (series 27/image 52). This is worrisome for primary penile cancer and results in obstruction and dilatation of the proximal/mid penile urethra, as described above. Additional multifocal lesions within the corpus cavernosum bilaterally, including a 1.8 x 4.1 cm lesion in the right proximal penile shaft (series 2/image 31), a 1.2 x 2.0 cm lesion in the left mid penile shaft (series 12/image 30) and an adjacent 1.4 x 1.4 cm lesion in the left mid penile shaft (series 12/image 28). These are all most conspicuous on diffusion imaging/ADC (series 18), where additional lesions are evident, worrisome for penile metastases. Bilateral testes are within normal limits. Other:  No pelvic ascites. Musculoskeletal: Degenerative changes of the lower lumbar spine. IMPRESSION: Mass involving the distal penile shaft, as described above, worrisome for primary penile cancer. 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 Nodal metastases  involving the right inguinal region, bilateral external iliac nodes, and bilateral common iliac nodes. These are mildly progressive from prior CT. Electronically Signed   By: Julian Hy M.D.   On: 11/08/2019 06:26   [place] CORE BIOPSY (LYMPH NODES)  Result Date: 11/11/2019 INDICATION: Penile mass worrisome for primary penile cancer, now with indeterminate right inguinal lymphadenopathy. Please perform ultrasound-guided biopsy for tissue diagnostic purposes. EXAM: ULTRASOUND-GUIDED RIGHT INGUINAL LYMPH NODE BIOPSY COMPARISON:  Pelvic MRI-11/08/2019;  CT abdomen and pelvis-08/27/2019 MEDICATIONS: None ANESTHESIA/SEDATION: Moderate (conscious) sedation was employed during this procedure. A total of Versed 2 mg and Fentanyl 50 mcg was administered intravenously. Moderate Sedation Time: 10 minutes. The patient's level of consciousness and vital signs were monitored continuously by radiology nursing throughout the procedure under my direct supervision. COMPLICATIONS: None immediate. TECHNIQUE: Informed written consent was obtained from the patient after a discussion of the risks, benefits and alternatives to treatment. Questions regarding the procedure were encouraged and answered. Initial ultrasound scanning demonstrated multiple pathologically enlarged right inguinal lymph nodes. A dominant right inguinal lymph node measuring approximately 2.0 x 1.4 cm was targeted for biopsy given lymph node location and sonographic window (image 8). An ultrasound image was saved for documentation purposes. The procedure was planned. A timeout was performed prior to the initiation of the procedure. The operative was prepped and draped in the usual sterile fashion, and a sterile drape was applied covering the operative field. A timeout was performed prior to the initiation of the procedure. Local anesthesia was provided with 1% lidocaine with epinephrine. Under direct ultrasound guidance, an 18 gauge core needle device was  utilized to obtain to obtain 6 core needle biopsies of the dominant right inguinal lymph node. The samples were placed in saline and submitted to pathology. The needle was removed and hemostasis was achieved with manual compression. Post procedure scan was negative for significant hematoma. A dressing was placed. The patient tolerated the procedure well without immediate postprocedural complication. IMPRESSION: Technically successful ultrasound guided biopsy of dominant right inguinal lymph node. Electronically Signed   By: Sandi Mariscal M.D.   On: 11/11/2019 11:20    Assessment and Plan:   74 year old man with:  1.  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  He presented with a large penile mass associated with lymphadenopathy.  This is biopsy-proven to show stage IV squamous cell carcinoma.  The natural course of this disease and treatment options were reviewed.  This disease appears to be incurable at this time and the treatment goal will be palliative.  The goal for treatment at this time will be twofold:  The first goal is to control his pain which is debilitating and is impacting his quality of life dramatically.  Although oxycodone has helped it does appear that he will continue to have this issue unless local therapy is implemented.  I am in favor of local surgical intervention for palliative purposes given his intractable pain.  If surgery cannot be done then palliative radiation therapy would be considered.  Systemic therapy will have very little effect on his local disease control and pain.  After achieving local control for symptom relief, we can discuss treating his metastatic disease.  Systemic chemotherapy predominantly platinum based would be a consideration although he is a marginal candidate for it.  Chemotherapy my offer short overall survival benefit but likely would not be curative.  This will be addressed in the future once local symptoms are  controlled.  Alternative options would be immunotherapy if he desires systemic therapy and not a platinum candidate.  2.  Pain control: Prescription for Percocet was refilled for him.  He might require long-acting pain medication in the future.  I urged him to continue to use stool softeners in the interim.  3.  Prognosis and goals of care: His disease is incurable and any therapy is palliative at this time.  His prognosis is overall guarded given his overall debilitation and marginal performance status  at baseline.  3.  Follow-up: Will be in the near future to follow his progress.  60  minutes were dedicated to this visit. The time was spent on reviewing laboratory data, imaging studies, discussing treatment options,  and answering questions regarding future plan.     A copy of this consult has been forwarded to the requesting physician.

## 2019-11-27 ENCOUNTER — Telehealth: Payer: Self-pay | Admitting: Oncology

## 2019-11-27 ENCOUNTER — Other Ambulatory Visit: Payer: Self-pay | Admitting: Oncology

## 2019-11-27 DIAGNOSIS — C609 Malignant neoplasm of penis, unspecified: Secondary | ICD-10-CM

## 2019-11-27 NOTE — Telephone Encounter (Signed)
Scheduled appt per 3/31 los.  Spoke with pt daughter and she is aware of the appt date and time.

## 2019-12-03 ENCOUNTER — Other Ambulatory Visit: Payer: Self-pay

## 2019-12-03 ENCOUNTER — Emergency Department (HOSPITAL_COMMUNITY)
Admission: EM | Admit: 2019-12-03 | Discharge: 2019-12-03 | Disposition: A | Discharge status: Home / Self Care | Payer: Medicare Other | Attending: Emergency Medicine | Admitting: Emergency Medicine

## 2019-12-03 ENCOUNTER — Encounter (HOSPITAL_COMMUNITY): Payer: Self-pay | Admitting: Emergency Medicine

## 2019-12-03 DIAGNOSIS — Z87891 Personal history of nicotine dependence: Secondary | ICD-10-CM | POA: Diagnosis not present

## 2019-12-03 DIAGNOSIS — Z7982 Long term (current) use of aspirin: Secondary | ICD-10-CM | POA: Diagnosis not present

## 2019-12-03 DIAGNOSIS — R600 Localized edema: Secondary | ICD-10-CM | POA: Insufficient documentation

## 2019-12-03 DIAGNOSIS — N4889 Other specified disorders of penis: Secondary | ICD-10-CM | POA: Diagnosis present

## 2019-12-03 DIAGNOSIS — Z79899 Other long term (current) drug therapy: Secondary | ICD-10-CM | POA: Insufficient documentation

## 2019-12-03 DIAGNOSIS — D074 Carcinoma in situ of penis: Secondary | ICD-10-CM | POA: Diagnosis not present

## 2019-12-03 LAB — URINALYSIS, ROUTINE W REFLEX MICROSCOPIC
Bilirubin Urine: NEGATIVE
Glucose, UA: NEGATIVE mg/dL
Hgb urine dipstick: NEGATIVE
Ketones, ur: NEGATIVE mg/dL
Nitrite: POSITIVE — AB
Protein, ur: >=300 mg/dL — AB
Specific Gravity, Urine: 1.023 (ref 1.005–1.030)
WBC, UA: >50 WBC/hpf — ABNORMAL HIGH (ref 0–5)
pH: 8 (ref 5.0–8.0)

## 2019-12-03 LAB — BASIC METABOLIC PANEL
Anion gap: 10 (ref 5–15)
BUN: 8 mg/dL (ref 8–23)
CO2: 25 mmol/L (ref 22–32)
Calcium: 8.7 mg/dL — ABNORMAL LOW (ref 8.9–10.3)
Chloride: 110 mmol/L (ref 98–111)
Creatinine, Ser: 0.84 mg/dL (ref 0.61–1.24)
GFR calc Af Amer: >60 mL/min (ref >60)
GFR calc non Af Amer: >60 mL/min (ref >60)
Glucose, Bld: 106 mg/dL — ABNORMAL HIGH (ref 70–99)
Potassium: 3.5 mmol/L (ref 3.5–5.1)
Sodium: 145 mmol/L (ref 135–145)

## 2019-12-03 MED ORDER — OXYCODONE-ACETAMINOPHEN 5-325 MG PO TABS: 1.0000 | ORAL_TABLET | Freq: Four times a day (QID) | ORAL | 0 refills | Status: DC | PRN

## 2019-12-03 MED ORDER — HYDROMORPHONE HCL 1 MG/ML IJ SOLN: 1.0000 mg | Freq: Once | INTRAMUSCULAR | Status: DC

## 2019-12-03 MED ORDER — HYDROMORPHONE HCL 1 MG/ML IJ SOLN
2.0000 mg | Freq: Once | INTRAMUSCULAR | Status: AC
  Administered 2019-12-03: 2 mg via INTRAMUSCULAR
  Filled 2019-12-03: qty 2

## 2019-12-03 MED ORDER — OXYCODONE-ACETAMINOPHEN 5-325 MG PO TABS: 1.0000 | ORAL_TABLET | Freq: Once | ORAL | Status: DC

## 2019-12-03 NOTE — Discharge Instructions (Addendum)
You need to use the pain medication as needed for your penis pain.  You can wear the compression socks to help with the swelling in your legs and elevate them when you can.  Kidneys are working normally today.  Need to follow-up with the cancer doctor for further treatment.

## 2019-12-03 NOTE — ED Notes (Signed)
Urine Culture sent to Main Lab with UA 

## 2019-12-03 NOTE — ED Triage Notes (Signed)
Pt arrives via [facility] from hotel with reports of penile discharge and pain.

## 2019-12-03 NOTE — ED Provider Notes (Signed)
[place] EMERGENCY DEPARTMENT Provider Note   CSN: [id number] Arrival date & time: 12/03/19  [id number]     History Chief Complaint  Patient presents with  . Dysuria    Norman Mendez is a 74 y.o. male.  Patient is a 74 year old male with a history of recently diagnosed penile cancer who has just recently followed up with oncology but has not started any treatment other than pain control who is presenting today with worsening pain of his penis.  Patient reports that the pain is unbearable 9 out of 10 and worse when he attempts to urinate.  He has been taking the oxycodone at home but does not feel like it is helping a lot.  He denies any recent fever, nausea or vomiting.  Patient was hospitalized on 11/11/2019 and at that time underwent a biopsy that showed stage IV adenocarcinoma of the penis that based on oncology's recent note was palliative and minimal ability to treat.  They did recommend potential surgical excision for symptoms versus palliative radiation.  They also discussed possibility for chemotherapy for his inguinal lymphadenopathy but he is not started any of this.  He denies any issues with his bowel movements, nausea or vomiting.  But does state that urinating makes the pain even worse.  The history is provided by the patient.  Dysuria Presenting symptoms: dysuria, penile discharge and penile pain        Past Medical History:  Diagnosis Date  . 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  Marland Kitchen Acute UTI 08/27/2019  . 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 Complicated UTI (urinary tract infection) 08/28/2019  . CVA (cerebral infarction)   . Dysarthria due to cerebrovascular accident   . Hyperlipemia   . Hypertension   . Lymphadenopathy   . NSTEMI (non-ST elevated myocardial infarction) ([place])   . Penile pain 11/07/2019  . Shortness of breath dyspnea   . Sinus tachycardia 11/16/2011     Patient Active Problem List   Diagnosis Date Noted  . Penile cancer ([place]) 11/23/2019  . Homelessness 11/23/2019  . Need for assistance due to unsteady gait 11/23/2019  . 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 Inguinal lymphadenopathy 11/07/2019  . Penile mass 11/07/2019  . Cognitive impairment 10/21/2014  . Essential hypertension 09/11/2014  . Hyperlipidemia 03/18/2014    Past Surgical History:  Procedure Laterality Date  . LEFT HEART CATHETERIZATION WITH CORONARY ANGIOGRAM N/A 03/18/2014   Procedure: LEFT HEART CATHETERIZATION WITH CORONARY ANGIOGRAM;  Surgeon: Sinclair Grooms, MD;  Location: [facility] CATH LAB;  Service: Cardiovascular;  Laterality: N/A;  . No previous surgery         Family History  Problem Relation Age of Onset  . Heart disease Other        No family history    Social History   Tobacco Use  . Smoking status: Former Smoker    Packs/day: 0.20    Types: Cigarettes    Quit date: 08/18/2014    Years since quitting: 5.2  . Smokeless tobacco: Never Used  Substance Use Topics  . Alcohol use: No    Comment: 3 40 ounce beer per week  . Drug use: No    Home Medications Prior to Admission medications   Medication Sig Start Date End Date Taking? Authorizing Provider  aspirin 81 MG chewable tablet Chew 1 tablet (81 mg total) by mouth daily. 12/31/18   Bufford Lope, DO  docusate sodium (COLACE) 100 MG  capsule Take 1 capsule (100 mg total) by mouth every 12 (twelve) hours. 11/22/19   Charlesetta Shanks, MD  lisinopril (ZESTRIL) 10 MG tablet Take 1 tablet (10 mg total) by mouth daily. Patient not taking: Reported on 11/08/2019 12/31/18   Bufford Lope, DO  metoprolol tartrate (LOPRESSOR) 25 MG tablet Take 1 tablet (25 mg total) by mouth 2 (two) times daily. 06/19/19   Patriciaann Clan, DO  nystatin cream (MYCOSTATIN) Apply to affected area 2 times daily 11/22/19   Charlesetta Shanks, MD  oxyCODONE-acetaminophen (PERCOCET) 5-325 MG tablet Take 1-2 tablets by mouth every 6 (six)  hours as needed. 11/26/19   Wyatt Portela, MD  polyethylene glycol (MIRALAX / GLYCOLAX) 17 g packet Take 17 g by mouth daily. 11/22/19   Charlesetta Shanks, MD    Allergies    Patient has no known allergies.  Review of Systems   Review of Systems  Genitourinary: Positive for discharge, dysuria and penile pain.  All other systems reviewed and are negative.   Physical Exam Updated Vital Signs BP 137/64 (BP Location: Right Arm)   Pulse (!) 122   Temp 98.7 F (37.1 C) (Oral)   Resp 14   Ht 5\' 6"  (1.676 m) Comment: Simultaneous filing. User may not have seen previous data.  Wt 63.5 kg Comment: Simultaneous filing. User may not have seen previous data.  SpO2 100%   BMI 22.60 kg/m   Physical Exam Vitals and nursing note reviewed.  Constitutional:      General: He is not in acute distress.    Appearance: Normal appearance. He is well-developed and normal weight.     Comments: Appears uncomfortable  HENT:     Head: Normocephalic and atraumatic.  Eyes:     Conjunctiva/sclera: Conjunctivae normal.     Pupils: Pupils are equal, round, and reactive to light.  Cardiovascular:     Rate and Rhythm: Regular rhythm. Tachycardia present.     Pulses: Normal pulses.     Heart sounds: No murmur.  Pulmonary:     Effort: Pulmonary effort is normal. No respiratory distress.     Breath sounds: Normal breath sounds. No wheezing or rales.  Abdominal:     General: There is no distension.     Palpations: Abdomen is soft.     Tenderness: There is no abdominal tenderness. There is no guarding or rebound.  Genitourinary:    Penis: Uncircumcised.        Comments: Fullness with palpable nodes present in bilateral groin Musculoskeletal:        General: No tenderness. Normal range of motion.     Cervical back: Normal range of motion and neck supple.     Right lower leg: Edema present.     Left lower leg: Edema present.     Comments: 2+ lower ext swelling  Skin:    General: Skin is warm and dry.      Findings: No erythema or rash.  Neurological:     General: No focal deficit present.     Mental Status: He is oriented to person, place, and time.  Psychiatric:        Mood and Affect: Mood normal.        Behavior: Behavior normal.        Thought Content: Thought content normal.     ED Results / Procedures / Treatments   Labs (all labs ordered are listed, but only abnormal results are displayed) Labs Reviewed  URINALYSIS, ROUTINE W REFLEX MICROSCOPIC -  Abnormal; Notable for the following components:      Result Value   APPearance CLOUDY (*)    Protein, ur >=300 (*)    Nitrite POSITIVE (*)    Leukocytes,Ua LARGE (*)    WBC, UA >50 (*)    Bacteria, UA MANY (*)    All other components within normal limits  BASIC METABOLIC PANEL    EKG None  Radiology No results found.  Procedures Procedures (including critical care time)  Medications Ordered in ED Medications  HYDROmorphone (DILAUDID) injection 2 mg (has no administration in time range)    ED Course  I have reviewed the triage vital signs and the nursing notes.  Pertinent labs & imaging results that were available during my care of the patient were reviewed by me and considered in my medical decision making (see chart for details).    MDM Rules/Calculators/A&P                      Patient presenting today with worsening penile pain with known penile mass and recent diagnosis of cancer.  He was given oxycodone at oncology appointment 1 week ago but reports the pain is worsening.  Pain is worse with urination.  On exam patient has a foul-smelling mass protruding from the end of his penis that has exudates but otherwise shaft of the penis appears normal.  Patient does not appear systemically ill but is tachycardic at 122 bpm today.  Patient does take metoprolol and reports he has been taking that medication but he ran out of the cream he was placing on his penis.  Do not feel that a UA will be helpful due to the  significant discharge coming out of the end of his penis.  He is afebrile here and denies any acute infectious issues.  Will ensure patient's renal function is remaining stable.  Will give pain control.  2:00 PM Patient is walking around but reports that he is worried about new swelling in his bilateral lower extremities.  Patient does have 2+ pitting edema present in bilateral lower extremities.  He denies any shortness of breath or chest pain.  This he reports is new over the last few days.  Were suspicion for DVT as writings are equal bilaterally.  May be related to circulation compromise due to adenopathy in the groin.  He is denying any shortness of breath but will recheck BMP to ensure no new kidney injury.  Spoke with his son and daughter and they report he recently ran out of Percocet and only had a few tramadol which does not appear to control his pain.  3:18 PM BMP with normal renal function.  Will have patient wear TED hose and follow-up with oncology as planned.  New prescription for pain control was sent to his pharmacy.  Again do not feel that UA is an accurate representation of a UTI as patient does not have systemic symptoms and it is painful just when the urine comes out of his penis.  Suspect the UA is contaminated by the mass he has.  Will not treat with antibiotics at this time.  Final Clinical Impression(s) / ED Diagnoses Final diagnoses:  Penile pain  Penile mass    Rx / DC Orders ED Discharge Orders         Ordered    oxyCODONE-acetaminophen (PERCOCET/ROXICET) 5-325 MG tablet  Every 6 hours PRN,   Status:  Discontinued     12/03/19 1522    oxyCODONE-acetaminophen (PERCOCET/ROXICET)  5-325 MG tablet  Every 6 hours PRN     12/03/19 1523           Blanchie Dessert, MD 12/03/19 1523

## 2019-12-03 NOTE — ED Provider Notes (Signed)
Patient seen by Dr. Maryan Rued.  Please see her note.  Patient states he is feeling better now.  He is ready for discharge.   Dorie Rank, MD 12/03/19 [phone]

## 2019-12-09 ENCOUNTER — Ambulatory Visit (INDEPENDENT_AMBULATORY_CARE_PROVIDER_SITE_OTHER): Payer: Medicare Other | Admitting: Family Medicine

## 2019-12-09 ENCOUNTER — Other Ambulatory Visit: Payer: Self-pay

## 2019-12-09 DIAGNOSIS — R609 Edema, unspecified: Secondary | ICD-10-CM | POA: Insufficient documentation

## 2019-12-09 DIAGNOSIS — C609 Malignant neoplasm of penis, unspecified: Secondary | ICD-10-CM | POA: Diagnosis not present

## 2019-12-09 DIAGNOSIS — I10 Essential (primary) hypertension: Secondary | ICD-10-CM | POA: Diagnosis present

## 2019-12-09 MED ORDER — LISINOPRIL 10 MG PO TABS: 10.0000 mg | ORAL_TABLET | Freq: Every day | ORAL | 0 refills | Status: DC

## 2019-12-09 MED ORDER — OXYCODONE-ACETAMINOPHEN 7.5-325 MG PO TABS: 1.0000 | ORAL_TABLET | ORAL | 0 refills | Status: DC | PRN

## 2019-12-09 MED ORDER — METOPROLOL TARTRATE 25 MG PO TABS: 25.0000 mg | ORAL_TABLET | Freq: Two times a day (BID) | ORAL | 0 refills | Status: DC

## 2019-12-09 NOTE — Progress Notes (Signed)
   CHIEF COMPLAINT / HPI: 74 year old male who presents with penile pain.  He has known distal shaft penile cancer and multiple metastases involving the proximal/mid penile shaft.  Looks like patient has multifocal oncology visit on 4/20 and appointment with Dr. Alen Blew on 4/27.  Patient was recently seen in the emergency department on 12/03/2019 for this pain.  He was discharged with 5 mg Percocet.  He states that these are sort of helping his pain but that he remains in very exquisite pain from his penile cancer.  He is requesting something to help his pain until he can get into see his oncologist.  Of note the patient is complaining of increased swelling in his bilateral lower extremities.  He states this has been going on for a long time now.  It does improve a lot when he sleeps, but complains of the day.  PERTINENT  PMH / PSH: Penile cancer   OBJECTIVE: BP (!) 152/82   Pulse (!) 110   Ht 5\' 6"  (1.676 m)   Wt 142 lb (64.4 kg)   SpO2 100%   BMI 22.92 kg/m   Gen: 74 year old male, no acute distress, resting comfortably CV: Skin warm and dry, 1+ pitting edema bilateral lower extremity. 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 His pain is poorly controlled.  Will increase oxycodone to 7.5 mg every 6 hours.  Recommended close follow-up PCP and oncology.  Edema Bilateral lower extremity swelling, likely in the venous insufficiency.  Recommended elevating her legs and compression stockings.  Can follow-up with his PCP for this issue.    Guadalupe Dawn, MD [place]

## 2019-12-09 NOTE — Patient Instructions (Signed)
It was great seeing you today!  Your pain is due to your penile cancer.  I do recommend you follow-up with your appointments on 4/20 and 4/27 with the oncology team.  In the meantime I will give you some pain medication that will help last to these appointments.  For your leg swelling I do recommend elevating your legs.  You can apply compression stockings as needed.  Gave you a handout for the compression stockings.  I refilled your blood pressure medications.

## 2019-12-09 NOTE — Assessment & Plan Note (Addendum)
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 His pain is poorly controlled.  Will increase oxycodone to 7.5 mg every 6 hours.  Recommended close follow-up PCP and oncology.

## 2019-12-09 NOTE — Assessment & Plan Note (Signed)
Bilateral lower extremity swelling, likely in the venous insufficiency.  Recommended elevating her legs and compression stockings.  Can follow-up with his PCP for this issue.

## 2019-12-12 ENCOUNTER — Encounter (HOSPITAL_COMMUNITY): Payer: Self-pay

## 2019-12-12 ENCOUNTER — Other Ambulatory Visit: Payer: Self-pay

## 2019-12-12 ENCOUNTER — Emergency Department (HOSPITAL_COMMUNITY)
Admission: EM | Admit: 2019-12-12 | Discharge: 2019-12-12 | Disposition: A | Discharge status: Home / Self Care | Payer: Medicare Other | Attending: Emergency Medicine | Admitting: Emergency Medicine

## 2019-12-12 DIAGNOSIS — Z7982 Long term (current) use of aspirin: Secondary | ICD-10-CM | POA: Insufficient documentation

## 2019-12-12 DIAGNOSIS — I252 Old myocardial infarction: Secondary | ICD-10-CM | POA: Insufficient documentation

## 2019-12-12 DIAGNOSIS — I10 Essential (primary) hypertension: Secondary | ICD-10-CM | POA: Insufficient documentation

## 2019-12-12 DIAGNOSIS — C609 Malignant neoplasm of penis, unspecified: Secondary | ICD-10-CM | POA: Diagnosis not present

## 2019-12-12 DIAGNOSIS — Z590 Homelessness: Secondary | ICD-10-CM | POA: Diagnosis not present

## 2019-12-12 DIAGNOSIS — Z8673 Personal history of transient ischemic attack (TIA), and cerebral infarction without residual deficits: Secondary | ICD-10-CM | POA: Insufficient documentation

## 2019-12-12 DIAGNOSIS — Z79899 Other long term (current) drug therapy: Secondary | ICD-10-CM | POA: Diagnosis not present

## 2019-12-12 DIAGNOSIS — N4889 Other specified disorders of penis: Secondary | ICD-10-CM | POA: Diagnosis present

## 2019-12-12 DIAGNOSIS — Z87891 Personal history of nicotine dependence: Secondary | ICD-10-CM | POA: Diagnosis not present

## 2019-12-12 LAB — BASIC METABOLIC PANEL
Anion gap: 9 (ref 5–15)
BUN: 13 mg/dL (ref 8–23)
CO2: 27 mmol/L (ref 22–32)
Calcium: 8.4 mg/dL — ABNORMAL LOW (ref 8.9–10.3)
Chloride: 106 mmol/L (ref 98–111)
Creatinine, Ser: 1.02 mg/dL (ref 0.61–1.24)
GFR calc Af Amer: >60 mL/min (ref >60)
GFR calc non Af Amer: >60 mL/min (ref >60)
Glucose, Bld: 114 mg/dL — ABNORMAL HIGH (ref 70–99)
Potassium: 3.4 mmol/L — ABNORMAL LOW (ref 3.5–5.1)
Sodium: 142 mmol/L (ref 135–145)

## 2019-12-12 NOTE — ED Notes (Signed)
Upon assessment of penile area, penis was noted to be hard and tender without erection. Thin yellow-white drainage noted coming out of tip of penis and dried drainage on brief. Denies any new sexual partners or previous UTIs.

## 2019-12-12 NOTE — ED Provider Notes (Addendum)
[place] DEPT Provider Note   CSN: [id number] Arrival date & time: 12/12/19  1153     History Chief Complaint  Patient presents with  . genital pain  . Dysuria  . Penile Discharge    Norman Mendez is a 74 y.o. male.  Patient presenting for assessment of the penile area pain.  And for discharge.  Chart review shows that patient is being followed by Dr. Alen Blew from hematology oncology for penile cancer stage IV seen by them on March 31.  They are working on the plans.  Patient also seen here in the emergency department on April 7 for the same complaint.  Had kidney function checked and it was okay.  Patient seen by his family medicine doctor on April 13.  And given 30 tablets of 7.5 mg of of oxycodone.  Patient when he was seen on April 7 was for pain.  Patient not really with a complaint of pain here.  Patient greatly by himself.  Has a daughter in the area.  Patient seems to be in somewhat denial about the penile cancer.  We attempted to contact daughter but was unable to get her at that time that he arrived.  But not actually sure how he got here.  Patient in no distress.        Past Medical History:  Diagnosis Date  . 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  Marland Kitchen Acute UTI 08/27/2019  . 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 Complicated UTI (urinary tract infection) 08/28/2019  . CVA (cerebral infarction)   . Dysarthria due to cerebrovascular accident   . Hyperlipemia   . Hypertension   . Lymphadenopathy   . NSTEMI (non-ST elevated myocardial infarction) ([place])   . Penile pain 11/07/2019  . Shortness of breath dyspnea   . Sinus tachycardia 11/16/2011    Patient Active Problem List   Diagnosis Date Noted  . Edema 12/09/2019  . Penile cancer ([place]) 11/23/2019  . Homelessness 11/23/2019  . Need for assistance due to unsteady gait 11/23/2019  . Urethral obstruction  11/08/2019  . Inguinal lymphadenopathy 11/07/2019  . Penile mass 11/07/2019  . Cognitive impairment 10/21/2014  . Essential hypertension 09/11/2014  . Hyperlipidemia 03/18/2014    Past Surgical History:  Procedure Laterality Date  . LEFT HEART CATHETERIZATION WITH CORONARY ANGIOGRAM N/A 03/18/2014   Procedure: LEFT HEART CATHETERIZATION WITH CORONARY ANGIOGRAM;  Surgeon: Sinclair Grooms, MD;  Location: [facility] CATH LAB;  Service: Cardiovascular;  Laterality: N/A;  . No previous surgery         Family History  Problem Relation Age of Onset  . Heart disease Other        No family history    Social History   Tobacco Use  . Smoking status: Former Smoker    Packs/day: 0.20    Types: Cigarettes    Quit date: 08/18/2014    Years since quitting: 5.3  . Smokeless tobacco: Never Used  Substance Use Topics  . Alcohol use: Not Currently    Comment: 3 40 ounce beer per week  . Drug use: No    Home Medications Prior to Admission medications   Medication Sig Start Date End Date Taking? Authorizing Provider  aspirin 81 MG chewable tablet Chew 1 tablet (81 mg total) by mouth daily. 12/31/18  Yes Orson Eva J, DO  docusate sodium (COLACE) 100 MG capsule Take 1 capsule (100 mg total) by mouth every 12 (  twelve) hours. 11/22/19  Yes Charlesetta Shanks, MD  metoprolol tartrate (LOPRESSOR) 25 MG tablet Take 1 tablet (25 mg total) by mouth 2 (two) times daily. 12/09/19  Yes Guadalupe Dawn, MD  oxyCODONE-acetaminophen (PERCOCET) 7.5-325 MG tablet Take 1 tablet by mouth every 4 (four) hours as needed for severe pain. 12/09/19  Yes Guadalupe Dawn, MD  polyethylene glycol (MIRALAX / GLYCOLAX) 17 g packet Take 17 g by mouth daily. 11/22/19  Yes Charlesetta Shanks, MD  lisinopril (ZESTRIL) 10 MG tablet Take 1 tablet (10 mg total) by mouth daily. 12/09/19   Guadalupe Dawn, MD  nystatin cream (MYCOSTATIN) Apply to affected area 2 times daily Patient not taking: Reported on 12/12/2019 11/22/19   Charlesetta Shanks, MD      Allergies    Patient has no known allergies.  Review of Systems   Review of Systems  Constitutional: Negative for chills and fever.  HENT: Negative for congestion, rhinorrhea and sore throat.   Eyes: Negative for visual disturbance.  Respiratory: Negative for cough and shortness of breath.   Cardiovascular: Positive for leg swelling. Negative for chest pain.  Gastrointestinal: Negative for abdominal pain, diarrhea, nausea and vomiting.  Genitourinary: Positive for discharge and penile pain. Negative for dysuria.  Musculoskeletal: Negative for back pain and neck pain.  Skin: Negative for rash.  Neurological: Negative for dizziness, light-headedness and headaches.  Hematological: Does not bruise/bleed easily.  Psychiatric/Behavioral: Negative for confusion.    Physical Exam Updated Vital Signs BP 116/85   Pulse 84   Temp 97.6 F (36.4 C) (Oral)   Resp 16   Ht 1.676 m (5\' 6" )   Wt 64.4 kg   SpO2 97%   BMI 22.92 kg/m   Physical Exam Vitals and nursing note reviewed.  Constitutional:      Appearance: Normal appearance. He is well-developed.  HENT:     Head: Normocephalic and atraumatic.  Eyes:     Extraocular Movements: Extraocular movements intact.     Conjunctiva/sclera: Conjunctivae normal.     Pupils: Pupils are equal, round, and reactive to light.  Cardiovascular:     Rate and Rhythm: Normal rate and regular rhythm.     Heart sounds: No murmur.  Pulmonary:     Effort: Pulmonary effort is normal. No respiratory distress.     Breath sounds: Normal breath sounds.  Abdominal:     General: Bowel sounds are normal.     Palpations: Abdomen is soft.     Tenderness: There is no abdominal tenderness.  Genitourinary:    Comments: Hard mass in the distal aspect of the penis.  Some whitish discharge.  Bilateral groin adenopathy. Musculoskeletal:     Cervical back: Normal range of motion and neck supple.     Right lower leg: Edema present.     Left lower leg: Edema  present.  Skin:    General: Skin is warm and dry.     Capillary Refill: Capillary refill takes less than 2 seconds.  Neurological:     General: No focal deficit present.     Mental Status: He is alert.     ED Results / Procedures / Treatments   Labs (all labs ordered are listed, but only abnormal results are displayed) Labs Reviewed  BASIC METABOLIC PANEL - Abnormal; Notable for the following components:      Result Value   Potassium 3.4 (*)    Glucose, Bld 114 (*)    Calcium 8.4 (*)    All other components within normal limits  EKG None  Radiology No results found.  Procedures Procedures (including critical care time)  Medications Ordered in ED Medications - No data to display  ED Course  I have reviewed the triage vital signs and the nursing notes.  Pertinent labs & imaging results that were available during my care of the patient were reviewed by me and considered in my medical decision making (see chart for details).    MDM Rules/Calculators/A&P                       Patient's presentation here today seems to be similar to the presentation on April 7.  However patient not complaining of any pain at this time.  Will contact family.  Will check kidney function on a basic metabolic panel.  And if okay will have him discharged home with follow-up with hematology oncology Dr. Alen Blew.  He may have missed an appointment.  The last saw him on March 31.  I think they are working out palliative care plan for him.  They are considering radiation treatment.  Based on hematology oncology's note this is stage IV cancer.  With a poor prognosis.  Renal function today is fine.  Get a hold of the daughter.  I will talk to her when she comes in to pick him up to make sure that the family is on board with the diagnosis prognosis and treatment plan.  Chart review shows that patient was given 30 tablets of oxycodone by primary care provider on April 13.   Final Clinical  Impression(s) / ED Diagnoses Final diagnoses:  Penile cancer [facility])    Rx / DC Orders ED Discharge Orders    None       Fredia Sorrow, MD 12/12/19 2010    Fredia Sorrow, MD 12/12/19 2011

## 2019-12-12 NOTE — Discharge Instructions (Addendum)
Take your Percocet medicine that you have available for pain.  Renal function here today is still stable.  Call and make follow-up appointment with Dr. Alen Blew and oncology.

## 2019-12-12 NOTE — ED Notes (Signed)
Pt up walking in the room with daughter [name] at bedside. Pt ready for d/c

## 2019-12-12 NOTE — ED Notes (Signed)
Pt lying in bed awake watching t.v. pt denies any needs. Male urinal bag replaced. Warm blankets given. NAD noted. Will continue to monitor.

## 2019-12-15 NOTE — Progress Notes (Signed)
GU Location of Tumor / Histology: Stage IV penile cancer  Norman Mendez was referred to Dr. Alen Blew by Dr. Darrelyn Hillock for further evaluation of Stage IV penile cancer with painful mass.   Biopsies of lymph nodes (if applicable) revealed:    Past/Anticipated interventions by urology, if any: no  Past/Anticipated interventions by medical oncology, if any: Per [name]'s note palliative is recommended since there are minimal treatment options. Recommended potential surgical excision for symptoms vs. Palliative radiation. Discussed possibility for chemotherapy for inguinal lymphadenopathy but he hasn't started this yet.   Weight changes, if any:   Bowel/Bladder complaints, if any: Denies issue with bowel movements. Reports pain with urination.   Nausea/Vomiting, if any: no  Pain issues, if any:  9/10 penile pain. [name] refilled percocet recently.  SAFETY ISSUES: Prior radiation?  Pacemaker/ICD?  Possible current pregnancy? no, male patient Is the patient on methotrexate?   Current Complaints / other details:  74 year old male. Single. 4 kids. Retired Secretary/administrator. History of homelessness. Daughter, [name], involved in patient's care.

## 2019-12-16 ENCOUNTER — Ambulatory Visit: Admission: RE | Admit: 2019-12-16 | Payer: Medicare Other | Source: Ambulatory Visit

## 2019-12-16 ENCOUNTER — Ambulatory Visit
Admission: RE | Admit: 2019-12-16 | Discharge: 2019-12-16 | Disposition: A | Discharge status: Home / Self Care | Payer: Medicare Other | Source: Ambulatory Visit | Attending: Radiation Oncology | Admitting: Radiation Oncology

## 2019-12-19 ENCOUNTER — Ambulatory Visit
Admission: RE | Admit: 2019-12-19 | Discharge: 2019-12-19 | Disposition: A | Discharge status: Home / Self Care | Payer: Medicare Other | Source: Ambulatory Visit | Attending: Radiation Oncology | Admitting: Radiation Oncology

## 2019-12-19 ENCOUNTER — Other Ambulatory Visit: Payer: Self-pay

## 2019-12-19 ENCOUNTER — Encounter: Payer: Self-pay | Admitting: Radiation Oncology

## 2019-12-19 ENCOUNTER — Telehealth: Payer: Self-pay

## 2019-12-19 VITALS — Ht 66.0 in | Wt 140.0 lb

## 2019-12-19 DIAGNOSIS — C609 Malignant neoplasm of penis, unspecified: Secondary | ICD-10-CM

## 2019-12-19 DIAGNOSIS — C601 Malignant neoplasm of glans penis: Secondary | ICD-10-CM

## 2019-12-19 HISTORY — DX: Malignant neoplasm of penis, unspecified: C60.9

## 2019-12-19 NOTE — Progress Notes (Signed)
Radiation Oncology         [phone] ________________________________  Initial Outpatient Consultation - Conducted via Telephone due to current COVID-19 concerns for limiting patient exposure  Name: Norman Mendez MRN: [id number]  Date: 12/19/2019  DOB: Mar 09, 1946  TM:[id number], Ralph Leyden, DO  Alen Blew Mathis Dad, MD   REFERRING PHYSICIAN: Wyatt Portela, MD  DIAGNOSIS: 74 y.o. 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    ICD-10-CM   1. Malignant neoplasm of glans penis (HCC)  C60.1   2. Penile cancer ([place])  C60.9     HISTORY OF PRESENT ILLNESS: Norman Mendez is a 74 y.o. male with a diagnosis of metastatic squamous cell carcinoma of the penis. Due to the patient's impaired mental status secondary to CVA in 2016, his history is confirmed with his daughter, [name], who is joining by phone today for this visit. He initially presented to the ED on 08/27/2019 with a UTI. A CT A/P performed at that time incidentally revealed retroperitoneal lymphadenopathy. Workup chest CT and PSA were both unremarkable.   He developed penile pain and presented to the ED on 09/29/2019. He was referred to Dr. Claudia Desanctis at [facility] on 10/07/2019. Pelvic MRI was ordered at that time, but patient was unable to lay still for the study due to pain.  He then presented back to [facility] ED on 11/07/2019 with worsening pain and was admitted at that time. Pelvic MRI on admission revealed a 4.9 cm mass along the distal penile shaft extending to the fossa navicularis worrisome for primary penile cancer and resulting in obstruction and dilatation of the proximal/mid penile urethra.  Additonal multifocal penile metastases involving proximal/mid penile shaft with nodal metastases involving the right inguinal region, bilateral external iliac nodes, and bilateral common iliac nodes, mildly progressed from prior CT.   He underwent and ultrasound guided core needle biopsy of a dominant, 2 cm right inguinal lymph node on  11/11/2019. Pathology from the procedure revealed metastatic poorly differentiated carcinoma with squamous differentiation.  Heh as continued with severe, poorly controlled pain despite taking Percocet around the clock.  He met with Dr. Alen Blew on 11/26/2019. They discussed palliative treatment options given the extent of his disease-- potential surgical excision vs palliative radiation therapy. It was advised that chemotherapy could possibly be added for the inguinal lymphadenopathy pending his response to surgery or radiation. He was seen in consult with Dr. Tresa Moore on 11/27/2019 to discuss surgical treatment options but this was not felt to be in his best interest due to the extent of the disease and low likelihood of providing any significant improvement in his pain.  The patient reviewed the biopsy results with his urologist and medical oncologist and he has kindly been referred today for discussion of potential radiation treatment options.  PREVIOUS RADIATION THERAPY: No  PAST MEDICAL HISTORY:  Past Medical History:  Diagnosis Date  . 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  Marland Kitchen Acute UTI 08/27/2019  . Cerebral thrombosis with cerebral infarction ([place]) 10/20/2014  . Complicated UTI (urinary tract infection) 08/28/2019  . CVA (cerebral infarction)   . Dysarthria due to cerebrovascular accident   . Hyperlipemia   . Hypertension   . Lymphadenopathy    lower extremities related to penile pain  . NSTEMI (non-ST elevated myocardial infarction) ([place])   . Penile cancer ([place])   . Penile pain 11/07/2019  . Shortness of breath dyspnea   . Sinus tachycardia 11/16/2011  PAST SURGICAL HISTORY: Past Surgical History:  Procedure Laterality Date  . LEFT HEART CATHETERIZATION WITH CORONARY ANGIOGRAM N/A 03/18/2014   Procedure: LEFT HEART CATHETERIZATION WITH CORONARY ANGIOGRAM;  Surgeon: Sinclair Grooms, MD;  Location: [facility] CATH LAB;  Service:  Cardiovascular;  Laterality: N/A;  . No previous surgery      FAMILY HISTORY:  Family History  Problem Relation Age of Onset  . Heart disease Other        No family history  . Cancer Neg Hx   . Breast cancer Neg Hx   . Penile cancer Neg Hx   . Colon cancer Neg Hx   . Pancreatic cancer Neg Hx     SOCIAL HISTORY:  Social History   Socioeconomic History  . Marital status: Single    Spouse name: Not on file  . Number of children: 4  . Years of education: Not on file  . Highest education level: Not on file  Occupational History    Comment: Retired housekeeper  Tobacco Use  . Smoking status: Former Smoker    Packs/day: 0.20    Types: Cigarettes    Quit date: 08/18/2014    Years since quitting: 5.3  . Smokeless tobacco: Never Used  . Tobacco comment: uncertain of when he began smoking thus duration is unknown  Substance and Sexual Activity  . Alcohol use: Not Currently  . Drug use: No  . Sexual activity: Not Currently  Other Topics Concern  . Not on file  Social History Narrative  . Not on file   Social Determinants of Health   Financial Resource Strain:   . Difficulty of Paying Living Expenses:   Food Insecurity:   . Worried About Charity fundraiser in the Last Year:   . Arboriculturist in the Last Year:   Transportation Needs:   . Film/video editor (Medical):   Marland Kitchen Lack of Transportation (Non-Medical):   Physical Activity:   . Days of Exercise per Week:   . Minutes of Exercise per Session:   Stress:   . Feeling of Stress :   Social Connections:   . Frequency of Communication with Friends and Family:   . Frequency of Social Gatherings with Friends and Family:   . Attends Religious Services:   . Active Member of Clubs or Organizations:   . Attends Archivist Meetings:   Marland Kitchen Marital Status:   Intimate Partner Violence:   . Fear of Current or Ex-Partner:   . Emotionally Abused:   Marland Kitchen Physically Abused:   . Sexually Abused:     ALLERGIES:  Patient has no known allergies.  MEDICATIONS:  Current Outpatient Medications  Medication Sig Dispense Refill  . aspirin 81 MG chewable tablet Chew 1 tablet (81 mg total) by mouth daily. 90 tablet 3  . docusate sodium (COLACE) 100 MG capsule Take 1 capsule (100 mg total) by mouth every 12 (twelve) hours. 60 capsule 0  . lisinopril (ZESTRIL) 10 MG tablet Take 1 tablet (10 mg total) by mouth daily. 90 tablet 0  . metoprolol tartrate (LOPRESSOR) 25 MG tablet Take 1 tablet (25 mg total) by mouth 2 (two) times daily. 180 tablet 0  . nystatin cream (MYCOSTATIN) Apply to affected area 2 times daily 15 g 0  . oxyCODONE-acetaminophen (PERCOCET) 7.5-325 MG tablet Take 1 tablet by mouth every 4 (four) hours as needed for severe pain. 30 tablet 0  . polyethylene glycol (MIRALAX / GLYCOLAX) 17 g packet Take 17 g  by mouth daily. 28 each 0   No current facility-administered medications for this encounter.    REVIEW OF SYSTEMS:  On review of systems, the patient reports that he continues with severe penile pain despite narcotic pain medications. He denies any chest pain, shortness of breath, cough, fevers, chills, night sweats, unintended weight changes. He denies any bowel disturbances, and denies abdominal pain, nausea or vomiting. He denies any new musculoskeletal or joint aches or pains. He continues to experience intense penile pain, which he rates 9/10. He has been to the ED for the pain twice in the last month. His daughter reports he is taking percocet every 4 hours, but that this does not manage the pain until the next dose. He has also recently developed severe bilateral leg swelling, which caused a fall yesterday. He has been homeless previously but is now living with his daughter, [name].  A complete review of systems is obtained and is otherwise negative.    PHYSICAL EXAM:  Wt Readings from Last 3 Encounters:  12/19/19 140 lb (63.5 kg)  12/19/19 140 lb (63.5 kg)  12/12/19 142 lb (64.4 kg)   Temp  Readings from Last 3 Encounters:  12/12/19 97.9 F (36.6 C) (Oral)  12/03/19 98.6 F (37 C) (Oral)  11/26/19 98.2 F (36.8 C) (Oral)   BP Readings from Last 3 Encounters:  12/12/19 (!) 140/95  12/09/19 (!) 152/82  12/03/19 (!) 150/70   Pulse Readings from Last 3 Encounters:  12/12/19 89  12/09/19 (!) 110  12/03/19 100   Pain Assessment Pain Score: 9  Pain Frequency: Constant Pain Loc: Penis/10  Physical exam not performed in light of telephone consult visit format.   KPS = 60  100 - Normal; no complaints; no evidence of disease. 90   - Able to carry on normal activity; minor signs or symptoms of disease. 80   - Normal activity with effort; some signs or symptoms of disease. 25   - Cares for self; unable to carry on normal activity or to do active work. 60   - Requires occasional assistance, but is able to care for most of his personal needs. 50   - Requires considerable assistance and frequent medical care. 29   - Disabled; requires special care and assistance. 52   - Severely disabled; hospital admission is indicated although death not imminent. 63   - Very sick; hospital admission necessary; active supportive treatment necessary. 10   - Moribund; fatal processes progressing rapidly. 0     - Dead  Karnofsky DA, Abelmann Warrens, Craver LS and Burchenal [facility] [phone]) The use of the nitrogen mustards in the palliative treatment of carcinoma: with particular reference to bronchogenic carcinoma Cancer 1 634-56  LABORATORY DATA:  Lab Results  Component Value Date   WBC 9.1 11/10/2019   HGB 11.7 (L) 11/10/2019   HCT 37.0 (L) 11/10/2019   MCV 89.6 11/10/2019   PLT 333 11/10/2019   Lab Results  Component Value Date   NA 142 12/12/2019   K 3.4 (L) 12/12/2019   CL 106 12/12/2019   CO2 27 12/12/2019   Lab Results  Component Value Date   ALT 30 11/19/2019   AST 22 11/19/2019   ALKPHOS 128 (H) 11/19/2019   BILITOT 0.2 11/19/2019     RADIOGRAPHY: No results found.     IMPRESSION/PLAN: This visit was conducted via Telephone to spare the patient unnecessary potential exposure in the healthcare setting during the current COVID-19 pandemic. 1. 74 y.o. 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 Today, we talked to the patient and his daughter, [name], about the findings and workup thus far. We discussed the natural history of metastatic penile cancer and general treatment, highlighting the role of palliative radiotherapy in the management of symptoms such as pin and LE swelling. We discussed the available radiation techniques, and focused on the details and logistics of delivery. The recommendations is to proceed with a 2 week course of daily radiotherapy to the penis and pelvic lymph nodes.  We reviewed the anticipated acute and late sequelae associated with radiation in this setting. The patient and his daughter were encouraged to ask questions that were answered to their satisfaction.  At the end of the conversation the patient is interested in moving forward with palliative radiation to the penis and pelvic lymph nodes.  He has provided verbal consent to proceed today and is tentatively scheduled for CT SIM/treatment planning at 3:30pm on Tuesday 12/23/19. He will sign formal written consent to proceed at that time and a copy of this document will be placed in his medical record.  We anticipate beginning his treatment later in the week. We will share our discussion with Dr. Alen Blew and Dr. Claudia Desanctis and move forward with treatment planning accordingly.  Given current concerns for patient exposure during the COVID-19 pandemic, this encounter was conducted via telephone. The patient was notified in advance and was offered a MyChart meeting to allow for face to face communication but unfortunately reported that he did not have the appropriate resources/technology to support such a visit and instead preferred to proceed with telephone consult. The patient has given  verbal consent for this type of encounter. The time spent during this encounter was 45 minutes. The attendants for this meeting include Tyler Pita MD, Ashlyn Bruning PA-C, [place], and patient, Karin Griffith and his daughter, [name]. During the encounter, Tyler Pita MD, Ashlyn Bruning PA-C, and scribe, Wilburn Mylar were located at [place].  Patient, Braydin Aloi and daughter, [name] were located at home.    Nicholos Johns, PA-C    Tyler Pita, MD  [facility] Direct Dial: [phone]  Fax: [phone] [email or web address]  Skype  [email or web address]  This document serves as a record of services personally performed by Tyler Pita, MD and Freeman Caldron, PA-C. It was created on their behalf by Wilburn Mylar, a trained medical scribe. The creation of this record is based on the scribe's personal observations and the provider's statements to them. This document has been checked and approved by the attending provider.

## 2019-12-19 NOTE — Telephone Encounter (Signed)
Norman Mendez, with [facility], calls nurse line requesting a VO from provider to start hospice care. The patient is in a great deal of pain, and he was referred by his mother. Per Tim, they can go out to his home as soon as tomorrow am. Please give VO to [place] on her secure VM [phone]

## 2019-12-19 NOTE — Progress Notes (Signed)
See progress note under physician encounter. 

## 2019-12-19 NOTE — Telephone Encounter (Signed)
Called number provided, no answer and voicemail box full.  I am in agreement with him establishing with hospice care.  I will try again later tonight.  Patriciaann Clan, DO

## 2019-12-19 NOTE — Progress Notes (Signed)
Phoned Lanelle Bal, RN for Dr. Alen Blew. Inquired as requested by Freeman Caldron, PA-C if it would be possible for Dr. Alen Blew to move his follow up on 12/23/2019 up to 3 pm from 3:30 pm. Lanelle Bal, RN phoned back and explained that Dr. Alen Blew wishes to cancel the patient's 12/23/19 follow up and reschedule for a time after radiation therapy is complete. Informed Ashlyn Bruning, PA-C of these findings immediately so SIM could be set for 12/23/2019. Phoned Lanelle Bal, RN back. Explained that Dr. Johny Shears intentions are to sim the patient on 4/27 and begin treatment on 4/29. Explained that Dr. Tammi Klippel intends to give ten radiation treatments. Lanelle Bal, RN verbalized understanding and intent to reschedule Dr. Hazeline Junker follow up for a time in May.

## 2019-12-19 NOTE — Progress Notes (Signed)
GU Location of Tumor / Histology: Stage IV penile cancer  Brisen Gaffey was referred to Dr. Alen Blew by Dr. Darrelyn Hillock for further evaluation of Stage IV penile cancer with painful mass.   Biopsies of lymph nodes (if applicable) revealed:    Past/Anticipated interventions by urology, if any: no  Past/Anticipated interventions by medical oncology, if any: Per [name]'s note palliative is recommended since there are minimal treatment options. Recommended potential surgical excision for symptoms vs. Palliative radiation. Discussed possibility for chemotherapy for inguinal lymphadenopathy but he hasn't started this yet.   Weight changes, if any: denies  Bowel/Bladder complaints, if any: Daughter reports the patient grimaces while voiding making her believe he is in pain. She denies seeing hematuria. Denies issue with bowel movements. Endorses taking Miralax and Colace.   Nausea/Vomiting, if any: no  Pain issues, if any:  9/10 penile pain. [name] refilled percocet recently. Daughter reports the 7.5/325 mg percocet every four hours doesn't hold him until the next dosage.   Daughter reports the patient fell yesterday attempting to get to the bathroom to void. Daughter explains her fathers legs are very swollen and getting worse each day. Daughter suspects his swollen legs is why he fell. She reports he has a walker. She goes onto say he laid in the floor most of the day yesterday until to get up until her sister came by to check on him.   SAFETY ISSUES:  Prior radiation? denies   Pacemaker/ICD? denies  Possible current pregnancy? no, male patient  Is the patient on methotrexate? denies  Current Complaints / other details:  74 year old male. Single. 4 kids. Retired Secretary/administrator. History of homelessness. Daughter, [name], involved in patient's care.

## 2019-12-22 NOTE — Telephone Encounter (Signed)
VO given to [place] on her secure VM.

## 2019-12-23 ENCOUNTER — Other Ambulatory Visit: Payer: Self-pay

## 2019-12-23 ENCOUNTER — Inpatient Hospital Stay: Payer: Medicare Other | Admitting: Oncology

## 2019-12-23 ENCOUNTER — Ambulatory Visit
Admission: RE | Admit: 2019-12-23 | Discharge: 2019-12-23 | Disposition: A | Discharge status: Home / Self Care | Source: Ambulatory Visit | Attending: Radiation Oncology | Admitting: Radiation Oncology

## 2019-12-23 DIAGNOSIS — C609 Malignant neoplasm of penis, unspecified: Secondary | ICD-10-CM | POA: Insufficient documentation

## 2019-12-23 DIAGNOSIS — C775 Secondary and unspecified malignant neoplasm of intrapelvic lymph nodes: Secondary | ICD-10-CM | POA: Insufficient documentation

## 2019-12-24 ENCOUNTER — Telehealth: Payer: Self-pay | Admitting: Oncology

## 2019-12-24 NOTE — Telephone Encounter (Signed)
Scheduled appt per 4/27 sch message - pt daughter is aware of appt date and time

## 2019-12-25 ENCOUNTER — Other Ambulatory Visit: Payer: Self-pay | Admitting: Radiation Oncology

## 2019-12-25 ENCOUNTER — Telehealth: Payer: Self-pay | Admitting: Radiation Oncology

## 2019-12-25 ENCOUNTER — Ambulatory Visit
Admission: RE | Admit: 2019-12-25 | Discharge: 2019-12-25 | Disposition: A | Discharge status: Home / Self Care | Source: Ambulatory Visit | Attending: Radiation Oncology | Admitting: Radiation Oncology

## 2019-12-25 ENCOUNTER — Other Ambulatory Visit: Payer: Self-pay

## 2019-12-25 DIAGNOSIS — A419 Sepsis, unspecified organism: Secondary | ICD-10-CM | POA: Diagnosis not present

## 2019-12-25 MED ORDER — MORPHINE SULFATE ER 15 MG PO TBCR: 15.0000 mg | EXTENDED_RELEASE_TABLET | Freq: Two times a day (BID) | ORAL | 0 refills | Status: DC

## 2019-12-25 NOTE — Telephone Encounter (Signed)
Attempting to get MS Contin script to patient to have this evening. Phoned patient's daughter, [name]. She reports her father is with her sister, Bufford Spikes, and provided me that number. Phoned Dyesha but no answer and her mailbox is full. There was an option to leave SMS text so I provided my direct office number. I plan to wait for her call back another fifteen minutes. If we don't connect I will try again tomorrow upon returning to the office at 0800.

## 2019-12-25 NOTE — Progress Notes (Signed)
  Radiation Oncology         [phone]) [phone] ________________________________  Name: Norman Mendez MRN: [id number]  Date: 12/23/2019  DOB: 06/06/1946  SIMULATION AND TREATMENT PLANNING NOTE    ICD-10-CM   1. 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  Identity was confirmed.  All relevant records and images related to the planned course of therapy were reviewed.  The patient freely provided informed written consent to proceed with treatment after reviewing the details related to the planned course of therapy. The consent form was witnessed and verified by the simulation staff.  Then, the patient was set-up in a stable reproducible  supine position for radiation therapy.  CT images were obtained.  Surface markings were placed.  The CT images were loaded into the planning software.  Then the target and avoidance structures were contoured.  Treatment planning then occurred.  The radiation prescription was entered and confirmed.  Then, I designed and supervised the construction of a total of 5 medically necessary complex treatment devices with vacloc bag for immobilization and at least 4 MLCs to shield critical organs, kidneys, bowel, etc.  I have requested : 3D Simulation  I have requested a DVH of the following structures: bladder, rectum, hips.   PLAN:  The patient will receive 30 Gy in 10 fractions  ________________________________  Sheral Apley. Tammi Klippel, M.D.

## 2019-12-26 ENCOUNTER — Encounter: Payer: Self-pay | Admitting: Radiation Oncology

## 2019-12-26 ENCOUNTER — Telehealth: Payer: Self-pay | Admitting: Radiation Oncology

## 2019-12-26 ENCOUNTER — Ambulatory Visit

## 2019-12-26 NOTE — Progress Notes (Signed)
Met patient's sister, [name], at the entrance of the cancer center. She was driving a white four door chrysler. Provided her with her father's script. Instructed her to phone if this medication did not help to lessen his pain. She verbalized understanding.

## 2019-12-26 NOTE — Telephone Encounter (Signed)
Noted patient did not present for radiation therapy. I understand from the therapist the patient was in too much pain. Unable to reach Dyesha I phoned [name]. Explained that Dyesha didn't pick up script or call back yesterday. [name] committed to present to pick up script in the next 15 minutes. This RN will stay to meet [name] and give her her father's MS Contin script.

## 2019-12-27 ENCOUNTER — Emergency Department (HOSPITAL_COMMUNITY): Payer: Medicare Other

## 2019-12-27 ENCOUNTER — Inpatient Hospital Stay (HOSPITAL_COMMUNITY)
Admission: EM | Admit: 2019-12-27 | Discharge: 2019-12-30 | DRG: 871 | Disposition: A | Discharge status: Hospice | Payer: Medicare Other | Attending: Internal Medicine | Admitting: Internal Medicine

## 2019-12-27 ENCOUNTER — Other Ambulatory Visit: Payer: Self-pay

## 2019-12-27 DIAGNOSIS — A419 Sepsis, unspecified organism: Secondary | ICD-10-CM | POA: Diagnosis not present

## 2019-12-27 DIAGNOSIS — Z515 Encounter for palliative care: Secondary | ICD-10-CM

## 2019-12-27 DIAGNOSIS — Z8673 Personal history of transient ischemic attack (TIA), and cerebral infarction without residual deficits: Secondary | ICD-10-CM

## 2019-12-27 DIAGNOSIS — Z87891 Personal history of nicotine dependence: Secondary | ICD-10-CM

## 2019-12-27 DIAGNOSIS — Z20822 Contact with and (suspected) exposure to covid-19: Secondary | ICD-10-CM | POA: Diagnosis present

## 2019-12-27 DIAGNOSIS — R4182 Altered mental status, unspecified: Principal | ICD-10-CM

## 2019-12-27 DIAGNOSIS — Z590 Homelessness: Secondary | ICD-10-CM

## 2019-12-27 DIAGNOSIS — I10 Essential (primary) hypertension: Secondary | ICD-10-CM | POA: Diagnosis present

## 2019-12-27 DIAGNOSIS — I69322 Dysarthria following cerebral infarction: Secondary | ICD-10-CM

## 2019-12-27 DIAGNOSIS — C609 Malignant neoplasm of penis, unspecified: Secondary | ICD-10-CM

## 2019-12-27 DIAGNOSIS — R6520 Severe sepsis without septic shock: Secondary | ICD-10-CM | POA: Diagnosis present

## 2019-12-27 DIAGNOSIS — E785 Hyperlipidemia, unspecified: Secondary | ICD-10-CM | POA: Diagnosis present

## 2019-12-27 DIAGNOSIS — I2510 Atherosclerotic heart disease of native coronary artery without angina pectoris: Secondary | ICD-10-CM | POA: Diagnosis present

## 2019-12-27 DIAGNOSIS — Z7982 Long term (current) use of aspirin: Secondary | ICD-10-CM

## 2019-12-27 DIAGNOSIS — Z66 Do not resuscitate: Secondary | ICD-10-CM | POA: Diagnosis present

## 2019-12-27 DIAGNOSIS — Z9221 Personal history of antineoplastic chemotherapy: Secondary | ICD-10-CM

## 2019-12-27 DIAGNOSIS — R531 Weakness: Secondary | ICD-10-CM

## 2019-12-27 DIAGNOSIS — Z923 Personal history of irradiation: Secondary | ICD-10-CM

## 2019-12-27 DIAGNOSIS — G9341 Metabolic encephalopathy: Secondary | ICD-10-CM | POA: Diagnosis present

## 2019-12-27 DIAGNOSIS — Z6820 Body mass index (BMI) 20.0-20.9, adult: Secondary | ICD-10-CM

## 2019-12-27 DIAGNOSIS — Z7189 Other specified counseling: Secondary | ICD-10-CM

## 2019-12-27 DIAGNOSIS — Z79899 Other long term (current) drug therapy: Secondary | ICD-10-CM

## 2019-12-27 DIAGNOSIS — R64 Cachexia: Secondary | ICD-10-CM | POA: Diagnosis present

## 2019-12-27 DIAGNOSIS — F329 Major depressive disorder, single episode, unspecified: Secondary | ICD-10-CM | POA: Diagnosis present

## 2019-12-27 DIAGNOSIS — D630 Anemia in neoplastic disease: Secondary | ICD-10-CM | POA: Diagnosis present

## 2019-12-27 DIAGNOSIS — N390 Urinary tract infection, site not specified: Secondary | ICD-10-CM | POA: Diagnosis present

## 2019-12-27 DIAGNOSIS — Z79891 Long term (current) use of opiate analgesic: Secondary | ICD-10-CM

## 2019-12-27 DIAGNOSIS — I252 Old myocardial infarction: Secondary | ICD-10-CM

## 2019-12-27 LAB — CBC WITH DIFFERENTIAL/PLATELET
Abs Immature Granulocytes: 0.2 10*3/uL — ABNORMAL HIGH (ref 0.00–0.07)
Basophils Absolute: 0 10*3/uL (ref 0.0–0.1)
Basophils Relative: 0 %
Eosinophils Absolute: 0 10*3/uL (ref 0.0–0.5)
Eosinophils Relative: 0 %
HCT: 31.1 % — ABNORMAL LOW (ref 39.0–52.0)
Hemoglobin: 9.3 g/dL — ABNORMAL LOW (ref 13.0–17.0)
Immature Granulocytes: 1 %
Lymphocytes Relative: 2 %
Lymphs Abs: 0.3 10*3/uL — ABNORMAL LOW (ref 0.7–4.0)
MCH: 27.9 pg (ref 26.0–34.0)
MCHC: 29.9 g/dL — ABNORMAL LOW (ref 30.0–36.0)
MCV: 93.4 fL (ref 80.0–100.0)
Monocytes Absolute: 0.8 10*3/uL (ref 0.1–1.0)
Monocytes Relative: 4 %
Neutro Abs: 18.9 10*3/uL — ABNORMAL HIGH (ref 1.7–7.7)
Neutrophils Relative %: 93 %
Platelets: 363 10*3/uL (ref 150–400)
RBC: 3.33 MIL/uL — ABNORMAL LOW (ref 4.22–5.81)
RDW: 15.1 % (ref 11.5–15.5)
WBC: 20.3 10*3/uL — ABNORMAL HIGH (ref 4.0–10.5)
nRBC: 0 % (ref 0.0–0.2)

## 2019-12-27 LAB — CBG MONITORING, ED: Glucose-Capillary: 164 mg/dL — ABNORMAL HIGH (ref 70–99)

## 2019-12-27 LAB — COMPREHENSIVE METABOLIC PANEL
ALT: 18 U/L (ref 0–44)
AST: 21 U/L (ref 15–41)
Albumin: 2.7 g/dL — ABNORMAL LOW (ref 3.5–5.0)
Alkaline Phosphatase: 70 U/L (ref 38–126)
Anion gap: 10 (ref 5–15)
BUN: 21 mg/dL (ref 8–23)
CO2: 21 mmol/L — ABNORMAL LOW (ref 22–32)
Calcium: 8.1 mg/dL — ABNORMAL LOW (ref 8.9–10.3)
Chloride: 108 mmol/L (ref 98–111)
Creatinine, Ser: 1.26 mg/dL — ABNORMAL HIGH (ref 0.61–1.24)
GFR calc Af Amer: >60 mL/min (ref >60)
GFR calc non Af Amer: 56 mL/min — ABNORMAL LOW (ref >60)
Glucose, Bld: 197 mg/dL — ABNORMAL HIGH (ref 70–99)
Potassium: 3.6 mmol/L (ref 3.5–5.1)
Sodium: 139 mmol/L (ref 135–145)
Total Bilirubin: 1.1 mg/dL (ref 0.3–1.2)
Total Protein: 7.1 g/dL (ref 6.5–8.1)

## 2019-12-27 LAB — LIPASE, BLOOD: Lipase: 16 U/L (ref 11–51)

## 2019-12-27 LAB — LACTIC ACID, PLASMA: Lactic Acid, Venous: 2.3 mmol/L (ref 0.5–1.9)

## 2019-12-27 LAB — APTT: aPTT: 31 seconds (ref 24–36)

## 2019-12-27 LAB — AMMONIA: Ammonia: 25 umol/L (ref 9–35)

## 2019-12-27 LAB — POC SARS CORONAVIRUS 2 AG -  ED: SARS Coronavirus 2 Ag: NEGATIVE

## 2019-12-27 LAB — PROTIME-INR
INR: 1.2 (ref 0.8–1.2)
Prothrombin Time: 14.7 seconds (ref 11.4–15.2)

## 2019-12-27 MED ORDER — SODIUM CHLORIDE 0.9 % IV BOLUS
1000.0000 mL | Freq: Once | INTRAVENOUS | Status: AC
  Administered 2019-12-27: 1000 mL via INTRAVENOUS

## 2019-12-27 MED ORDER — SODIUM CHLORIDE 0.9 % IV SOLN: 500.0000 mg | Freq: Once | INTRAVENOUS | Status: DC

## 2019-12-27 MED ORDER — VANCOMYCIN HCL IN DEXTROSE 1-5 GM/200ML-% IV SOLN
1000.0000 mg | Freq: Once | INTRAVENOUS | Status: AC
  Administered 2019-12-27: 1000 mg via INTRAVENOUS
  Filled 2019-12-27: qty 200

## 2019-12-27 MED ORDER — SODIUM CHLORIDE 0.9 % IV SOLN
2.0000 g | Freq: Once | INTRAVENOUS | Status: AC
  Administered 2019-12-27: 2 g via INTRAVENOUS
  Filled 2019-12-27: qty 2

## 2019-12-27 MED ORDER — SODIUM CHLORIDE 0.9% FLUSH
3.0000 mL | Freq: Once | INTRAVENOUS | Status: AC
  Administered 2019-12-27: 3 mL via INTRAVENOUS

## 2019-12-27 MED ORDER — SODIUM CHLORIDE 0.9 % IV SOLN: 1.0000 g | Freq: Once | INTRAVENOUS | Status: DC

## 2019-12-27 MED ORDER — PIPERACILLIN-TAZOBACTAM 3.375 G IVPB 30 MIN: 3.3750 g | Freq: Once | INTRAVENOUS | Status: DC

## 2019-12-27 NOTE — ED Notes (Signed)
Pt in xray/ ct

## 2019-12-27 NOTE — H&P (Signed)
History and Physical    Norman Mendez CBJ:[id number] DOB: 06-17-1946 DOA: 12/27/2019  PCP: Patriciaann Clan, DO  Patient coming from: [facility] via EMS  I have personally briefly reviewed patient's old medical records in [place]  Chief Complaint: Altered mental status  HPI: Norman Mendez is a 74 y.o. 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  Patient unable to provide history therefore entirety of history is supplemented by EDP, chart review, and patient's daughter by phone.  Patient has known metastatic penile cancer and recently started on palliative radiation.  He was not considered a surgical candidate.  He has been having significant uncontrolled pain and was started on MS Contin to try to help alleviate it.  He is currently staying in a hotel room.  His daughter has been helping give his medications and when she went over there this morning she gave him 1 tablet of MS Contin and left 2 tablets for later.  When she returned to see him he did not answer when she knocked on the door.  She saw him slumped over on a table.  She was able to get inside and noted that he was altered and not speaking (she says he was speaking in the morning).  She says the 2 tablets of MS Contin were still there so she did not think he took extra pain medications.  She subsequently called 911 for further evaluation.  ED Course:  Initial vitals showed BP 156/76, pulse 122, RR 24, temp 99.8 Fahrenheit rectally, SPO2 100% on room air.  Labs are notable for WBC 20.3, hemoglobin 9.3, platelets 363,000, sodium 139, potassium 3.6, bicarb 21, BUN 21, creatinine 1.26, AST 21, ALT 18, alk phos 70, total bilirubin 1.1, serum glucose 197, lactic acid 2.3, ammonia 25, lipase 16.  Blood  cultures were collected and pending.  Urinalysis and urine culture were ordered and awaiting collection.  POC SARS-CoV-2 antigen is negative.  SARS-CoV-2 PCR respiratory panel is collected and pending.  2 view chest x-ray was negative for acute infiltrate.  CT head without contrast is negative for acute intracranial abnormality but does show age-indeterminate infarct involving the left inferior cerebellum, small age-indeterminate infarct involving the right posterior parieto-occipital lobe, chronic appearing infarct involving the inferior right cerebellum, and chronic bilateral basal ganglia lacunar infarcts.  Patient was given 1 L normal saline and empiric IV vancomycin and cefepime.  The hospitalist service was consulted to admit for further evaluation and management.  Review of Systems:  Unable to obtain full review of systems due to patient's altered mental status.   Past Medical History:  Diagnosis Date  . 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  Marland Kitchen Acute UTI 08/27/2019  . Cerebral thrombosis with cerebral infarction ([place]) 10/20/2014  . Complicated UTI (urinary tract infection) 08/28/2019  . CVA (cerebral infarction)   . Dysarthria due to cerebrovascular accident   . Hyperlipemia   . Hypertension   . Lymphadenopathy    lower extremities related to penile pain  . NSTEMI (non-ST elevated myocardial infarction) ([place])   . Penile cancer ([place])   . Penile pain 11/07/2019  . Shortness of breath dyspnea   . Sinus tachycardia 11/16/2011    Past Surgical History:  Procedure Laterality Date  . LEFT HEART CATHETERIZATION WITH CORONARY ANGIOGRAM N/A 03/18/2014  Procedure: LEFT HEART CATHETERIZATION WITH CORONARY ANGIOGRAM;  Surgeon: Sinclair Grooms, MD;  Location: [facility] CATH LAB;  Service: Cardiovascular;  Laterality: N/A;  . No previous surgery      Social History:  reports that he quit smoking about 5 years ago. His smoking use included  cigarettes. He smoked 0.20 packs per day. He has never used smokeless tobacco. He reports previous alcohol use. He reports that he does not use drugs.  No Known Allergies  Family History  Problem Relation Age of Onset  . Heart disease Other        No family history  . Cancer Neg Hx   . Breast cancer Neg Hx   . Penile cancer Neg Hx   . Colon cancer Neg Hx   . Pancreatic cancer Neg Hx      Prior to Admission medications   Medication Sig Start Date End Date Taking? Authorizing Provider  aspirin 81 MG chewable tablet Chew 1 tablet (81 mg total) by mouth daily. 12/31/18  Yes Orson Eva J, DO  docusate sodium (COLACE) 100 MG capsule Take 1 capsule (100 mg total) by mouth every 12 (twelve) hours. 11/22/19  Yes Charlesetta Shanks, MD  lisinopril (ZESTRIL) 10 MG tablet Take 1 tablet (10 mg total) by mouth daily. 12/09/19  Yes Guadalupe Dawn, MD  metoprolol tartrate (LOPRESSOR) 25 MG tablet Take 1 tablet (25 mg total) by mouth 2 (two) times daily. 12/09/19  Yes Guadalupe Dawn, MD  morphine (MS CONTIN) 15 MG 12 hr tablet Take 1 tablet (15 mg total) by mouth every 12 (twelve) hours. 12/25/19  Yes Gery Pray, MD  nystatin cream (MYCOSTATIN) Apply to affected area 2 times daily 11/22/19  Yes Pfeiffer, Jeannie Done, MD  polyethylene glycol (MIRALAX / GLYCOLAX) 17 g packet Take 17 g by mouth daily. 11/22/19  Yes Charlesetta Shanks, MD  oxyCODONE-acetaminophen (PERCOCET) 7.5-325 MG tablet Take 1 tablet by mouth every 4 (four) hours as needed for severe pain. Patient not taking: Reported on 12/27/2019 12/09/19   Guadalupe Dawn, MD    Physical Exam: Vitals:   12/27/19 2107 12/27/19 2115 12/27/19 2130 12/27/19 2315  BP:  (!) 141/75 (!) 156/76 (!) 167/79  Pulse:  (!) 120 (!) 116 (!) 108  Resp:  19 16 (!) 24  Temp:      TempSrc:      SpO2:  100% 100% 97%  Height: 5' 10"  (1.778 m)      Constitutional: Elderly man resting supine in bed, somnolent but appears in NAD, calm, comfortable Eyes: PERRL, lids and  conjunctivae normal, arcus senilis present ENMT: Mucous membranes are moist. Posterior pharynx clear of any exudate or lesions.poor dentition.  Neck: normal, supple, no masses. Respiratory: clear to auscultation anteriorly, no wheezing, no crackles. Normal respiratory effort. No accessory muscle use.  Cardiovascular: Regular rate and rhythm, no murmurs / rubs / gallops.  +1 bilateral lower extremity edema. Abdomen: no tenderness, no masses palpated. No hepatosplenomegaly. Bowel sounds positive.  Musculoskeletal: no clubbing / cyanosis.  Bony protrusion of the upper sternum.  No joint deformity upper and lower extremities. Good ROM, no contractures. Normal muscle tone. GU: Pure wick in place, no obvious external penile infection Skin: no rashes, lesions, ulcers. No induration. Neurologic: Limited due to cooperation, moving all extremities equally, strength and sensation appears intact.  He is nonverbal. Psychiatric: Somnolent nonverbal, and keeps eyes closed but easily withdraws to stimuli and shaking head no while RN attempted to administer Covid nasal swab  Labs on Admission: I  have personally reviewed following labs and imaging studies  CBC: Recent Labs  Lab 12/27/19 2108  WBC 20.3*  NEUTROABS 18.9*  HGB 9.3*  HCT 31.1*  MCV 93.4  PLT 177   Basic Metabolic Panel: Recent Labs  Lab 12/27/19 2108  NA 139  K 3.6  CL 108  CO2 21*  GLUCOSE 197*  BUN 21  CREATININE 1.26*  CALCIUM 8.1*   GFR: Estimated Creatinine Clearance: 46.9 mL/min (A) (by C-G formula based on SCr of 1.26 mg/dL (H)). Liver Function Tests: Recent Labs  Lab 12/27/19 2108  AST 21  ALT 18  ALKPHOS 70  BILITOT 1.1  PROT 7.1  ALBUMIN 2.7*   Recent Labs  Lab 12/27/19 2112  LIPASE 16   Recent Labs  Lab 12/27/19 2114  AMMONIA 25   Coagulation Profile: Recent Labs  Lab 12/27/19 2108  INR 1.2   Cardiac Enzymes: No results for input(s): CKTOTAL, CKMB, CKMBINDEX, TROPONINI in the last 168  hours. BNP (last 3 results) No results for input(s): PROBNP in the last 8760 hours. HbA1C: No results for input(s): HGBA1C in the last 72 hours. CBG: Recent Labs  Lab 12/27/19 2111  GLUCAP 164*   Lipid Profile: No results for input(s): CHOL, HDL, LDLCALC, TRIG, CHOLHDL, LDLDIRECT in the last 72 hours. Thyroid Function Tests: No results for input(s): TSH, T4TOTAL, FREET4, T3FREE, THYROIDAB in the last 72 hours. Anemia Panel: No results for input(s): VITAMINB12, FOLATE, FERRITIN, TIBC, IRON, RETICCTPCT in the last 72 hours. Urine analysis:    Component Value Date/Time   COLORURINE YELLOW 12/03/2019 1255   APPEARANCEUR CLOUDY (A) 12/03/2019 1255   LABSPEC 1.023 12/03/2019 1255   PHURINE 8.0 12/03/2019 1255   GLUCOSEU NEGATIVE 12/03/2019 1255   HGBUR NEGATIVE 12/03/2019 1255   [place] 12/03/2019 1255   KETONESUR NEGATIVE 12/03/2019 1255   PROTEINUR >=300 (A) 12/03/2019 1255   UROBILINOGEN 0.2 10/19/2014 1512   NITRITE POSITIVE (A) 12/03/2019 1255   LEUKOCYTESUR LARGE (A) 12/03/2019 1255    Radiological Exams on Admission: DG Chest 2 View  Result Date: 12/27/2019 CLINICAL DATA:  Altered mental status. EXAM: CHEST - 2 VIEW COMPARISON:  May 10, 2016 FINDINGS: There is no evidence of an acute infiltrate or pneumothorax. Mild, stable blunting of the left costophrenic angle is seen. The heart size and mediastinal contours are within normal limits. There is moderate severity calcification of the aortic arch. The visualized skeletal structures are unremarkable. IMPRESSION: Stable exam without active cardiopulmonary disease. Electronically Signed   By: Virgina Norfolk M.D.   On: 12/27/2019 22:20   CT Head Wo Contrast  Result Date: 12/27/2019 CLINICAL DATA:  Altered mental status. EXAM: CT HEAD WITHOUT CONTRAST TECHNIQUE: Contiguous axial images were obtained from the base of the skull through the vertex without intravenous contrast. COMPARISON:  10/19/2014. FINDINGS:  Brain: No evidence of acute infarction, hemorrhage, hydrocephalus, extra-axial collection or mass lesion/mass effect. Advanced white matter disease is noted bilaterally. There are bilateral chronic appearing lacunar infarcts. There are age-indeterminate but chronic appearing infarcts involving the bilateral inferior cerebellum. There is a small age-indeterminate infarct involving the right parietooccipital lobe measuring approximately 1.1 cm (axial series 2, image 16). This is new since 2016. Vascular: No hyperdense vessel or unexpected calcification. Skull: Normal. Negative for fracture or focal lesion. Sinuses/Orbits: No acute finding. Other: None. IMPRESSION: 1. No acute intracranial abnormality. 2. Age indeterminate infarct involving the left inferior cerebellum. 3. Small age-indeterminate infarct involving the right posterior parietooccipital lobe. This is new since 2016. 4. Chronic  appearing infarct involving the inferior right cerebellum, new since 2016. 5. Chronic bilateral basal ganglia lacunar infarcts are again noted. Electronically Signed   By: Constance Holster M.D.   On: 12/27/2019 22:54    EKG: Independently reviewed. Sinus tachycardia, slight ST depression in V4, early R wave transition.  When compared to prior, previous EKG showed PVCs in V4 ST changes were not present at that time.  Assessment/Plan Principal Problem:   Sepsis ([place]) Active Problems:   Hyperlipidemia   Essential hypertension   Penile cancer ([place])   History of CVA (cerebrovascular accident)   CAD (coronary artery disease)  Dawson Albers is a 74 y.o. 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  Altered mental status due to sepsis from presumed urinary source: Patient presenting with tachycardia,  tachypnea, leukocytosis, and elevated lactic acid.  Suspect presentation due to urinary infection however urine sample not provided yet and patient uncooperative with in and out catheter attempt.  Altered mental status also may be due to side effect from pain medication. -Continue empiric IV ceftriaxone -Follow blood cultures, UA/urine culture -Give additional 1 L NS bolus followed by maintenance fluids overnight -Hold home MS Contin and sedating medications for now  Stage IV penile squamous cell carcinoma: Follows with oncology, Dr. Alen Blew, and radiation oncology, Dr. Tammi Klippel.  Recently started on palliative radiation.  History of CVA/hyperlipidemia: With reported baseline dysarthria.  CT head is without acute intracranial abnormality but does show age-indeterminate infarct involving left inferior cerebellum, right posterior parieto-occipital lobe, and chronic appearing inferior right cerebellum and bilateral basal ganglia lacunar infarcts. -Continue aspirin -Previously on atorvastatin, no longer taking per med reconciliation (recent outpatient notes state he was taking 2-3 tablets of atorvastatin 80 mg erroneously)  CAD: Continue aspirin.  Anemia: Hemoglobin 9.3 on admission without obvious bleeding.  Continue to monitor.  Hypertension: Resume home Lopressor.  Holding lisinopril tonight with mildly elevated creatinine.  Generalized weakness: Daughter states that patient appears too weak to care for self or ambulate.  She feels he may need placement in a nursing facility. -PT/OT eval  DVT prophylaxis: Lovenox Code Status: Full code per daughter Family Communication: Discussed with his daughter Georga Bora by phone [phone] Disposition Plan: Pending improvement in mental status, further work-up, and PT/OT evaluation Consults called: None Admission status:  Status is: Observation  The patient remains OBS appropriate and will d/c before 2 midnights.  Dispo: The patient is from:  Staying in a hotel              Anticipated d/c is to: Previous residence versus SNF pending PT/OT eval              Anticipated d/c date is: 1 day              Patient currently is not medically stable to d/c.   Zada Finders MD [facility]  If 7PM-7AM, please contact night-coverage [email or web address]  12/27/2019, 11:25 PM

## 2019-12-27 NOTE — ED Provider Notes (Signed)
[facility]-EMERGENCY DEPT Provider Note   CSN: [id number] Arrival date & time: 12/27/19  2041     History Chief Complaint  Patient presents with  . Altered Mental Status    Norman Mendez is a 74 y.o. male.  Level 5 caveat due to altered mental status, nonverbal at baseline  The history is provided by the patient, a caregiver and the EMS personnel.  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 )        Past Medical History:  Diagnosis Date  . 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  Marland Kitchen Acute UTI 08/27/2019  . 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 Complicated UTI (urinary tract infection) 08/28/2019  . CVA (cerebral infarction)   . Dysarthria due to cerebrovascular accident   . Hyperlipemia   . Hypertension   . Lymphadenopathy    lower extremities related to penile pain  . NSTEMI (non-ST elevated myocardial infarction) (HCC)   . Penile cancer (HCC)   . Penile pain 11/07/2019  . Shortness of breath dyspnea   . Sinus tachycardia 11/16/2011    Patient Active Problem List   Diagnosis Date Noted  . Edema 12/09/2019  . Penile cancer (HCC) 11/23/2019  . Homelessness 11/23/2019  . Need for assistance due to unsteady gait 11/23/2019  . 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 Inguinal lymphadenopathy 11/07/2019  . Penile mass 11/07/2019  . Cognitive impairment 10/21/2014  . Essential hypertension 09/11/2014  . Hyperlipidemia 03/18/2014    Past Surgical History:  Procedure Laterality Date  . LEFT HEART CATHETERIZATION WITH CORONARY ANGIOGRAM N/A 03/18/2014   Procedure: LEFT HEART CATHETERIZATION WITH CORONARY ANGIOGRAM;  Surgeon: Lesleigh Noe, MD;  Location: [facility] CATH LAB;  Service: Cardiovascular;  Laterality: N/A;  . No previous surgery         Family History  Problem Relation Age of Onset  . Heart disease Other        No family history  . Cancer Neg Hx   . Breast cancer Neg Hx   . Penile cancer Neg Hx   . Colon cancer Neg Hx   . Pancreatic cancer Neg Hx     Social History   Tobacco Use  . Smoking status: Former Smoker    Packs/day: 0.20    Types: Cigarettes    Quit date: 08/18/2014    Years since quitting: 5.3  . Smokeless tobacco: Never Used  . Tobacco comment: uncertain of when he began smoking thus duration is unknown  Substance Use Topics  . Alcohol use: Not Currently  . Drug use: No    Home Medications Prior to Admission medications   Medication Sig Start Date End Date Taking? Authorizing Provider  aspirin 81 MG chewable tablet Chew 1 tablet (81 mg total) by mouth daily. 12/31/18  Yes Jeneen Rinks J, DO  docusate sodium (COLACE) 100 MG capsule Take 1 capsule (100 mg total) by mouth every 12 (twelve) hours. 11/22/19  Yes Arby Barrette, MD  lisinopril (ZESTRIL) 10 MG tablet Take 1 tablet (10 mg total) by mouth daily. 12/09/19  Yes Myrene Buddy, MD  metoprolol tartrate (LOPRESSOR) 25 MG tablet Take 1 tablet (25 mg total)  by mouth 2 (two) times daily. 12/09/19  Yes Myrene Buddy, MD  morphine (MS CONTIN) 15 MG 12 hr tablet Take 1 tablet (15 mg total) by mouth every 12 (twelve) hours. 12/25/19  Yes Antony Blackbird, MD  nystatin cream (MYCOSTATIN) Apply to affected area 2 times daily 11/22/19  Yes Pfeiffer, Lebron Conners, MD  polyethylene glycol (MIRALAX / GLYCOLAX) 17 g packet Take 17 g by mouth daily. 11/22/19  Yes Arby Barrette, MD  oxyCODONE-acetaminophen (PERCOCET) 7.5-325 MG tablet Take 1 tablet by mouth every 4 (four) hours as needed for severe pain. Patient not taking: Reported on 12/27/2019 12/09/19   Myrene Buddy, MD     Allergies    Patient has no known allergies.  Review of Systems   Review of Systems  Unable to perform ROS: Patient nonverbal  Psychiatric/Behavioral: Positive for confusion.    Physical Exam Updated Vital Signs  ED Triage Vitals  Enc Vitals Group     BP 12/27/19 2103 (!) 188/80     Pulse Rate 12/27/19 2103 (!) 122     Resp 12/27/19 2103 (!) 31     Temp 12/27/19 2103 99.8 F (37.7 C)     Temp Source 12/27/19 2103 Rectal     SpO2 12/27/19 2056 97 %     Weight --      Height 12/27/19 2107 5\' 10"  (1.778 m)     Head Circumference --      Peak Flow --      Pain Score --      Pain Loc --      Pain Edu? --      Excl. in GC? --     Physical Exam Constitutional:      Appearance: He is ill-appearing.  HENT:     Head: Normocephalic and atraumatic.     Nose: Nose normal.     Mouth/Throat:     Mouth: Mucous membranes are moist.  Eyes:     Extraocular Movements: Extraocular movements intact.     Pupils: Pupils are equal, round, and reactive to light.  Cardiovascular:     Rate and Rhythm: Tachycardia present.  Pulmonary:     Effort: No respiratory distress.     Comments: Coarse breath sounds, increased work of breathing Abdominal:     General: There is no distension.     Palpations: There is no mass.     Tenderness: There is no abdominal tenderness. There is no guarding.     Hernia: No hernia is present.  Genitourinary:    Penis: Normal.      Testes: Normal.  Musculoskeletal:     Cervical back: Normal range of motion.     Right lower leg: Edema (2+) present.     Left lower leg: Edema (2+) present.  Skin:    Capillary Refill: Capillary refill takes less than 2 seconds.  Neurological:     General: No focal deficit present.     Mental Status: He is alert.     Comments: Follows commands, moves all extremities, mumbles words     ED Results / Procedures / Treatments   Labs (all labs ordered are listed, but only abnormal results are displayed) Labs Reviewed   COMPREHENSIVE METABOLIC PANEL - Abnormal; Notable for the following components:      Result Value   CO2 21 (*)    Glucose, Bld 197 (*)    Creatinine, Ser 1.26 (*)    Calcium 8.1 (*)    Albumin 2.7 (*)    GFR  calc non Af Amer 56 (*)    All other components within normal limits  LACTIC ACID, PLASMA - Abnormal; Notable for the following components:   Lactic Acid, Venous 2.3 (*)    All other components within normal limits  CBC WITH DIFFERENTIAL/PLATELET - Abnormal; Notable for the following components:   WBC 20.3 (*)    RBC 3.33 (*)    Hemoglobin 9.3 (*)    HCT 31.1 (*)    MCHC 29.9 (*)    Neutro Abs 18.9 (*)    Lymphs Abs 0.3 (*)    Abs Immature Granulocytes 0.20 (*)    All other components within normal limits  CBG MONITORING, ED - Abnormal; Notable for the following components:   Glucose-Capillary 164 (*)    All other components within normal limits  CULTURE, BLOOD (ROUTINE X 2)  CULTURE, BLOOD (ROUTINE X 2)  URINE CULTURE  RESPIRATORY PANEL BY RT PCR (FLU A&B, COVID)  PROTIME-INR  LIPASE, BLOOD  APTT  AMMONIA  LACTIC ACID, PLASMA  URINALYSIS, ROUTINE W REFLEX MICROSCOPIC  RAPID URINE DRUG SCREEN, HOSP PERFORMED  POC SARS CORONAVIRUS 2 AG -  ED    EKG EKG Interpretation  Date/Time:  Saturday Dec 27 2019 21:14:06 EDT Ventricular Rate:  121 PR Interval:    QRS Duration: 99 QT Interval:  294 QTC Calculation: 418 R Axis:   36 Text Interpretation: Sinus tachycardia LAE, consider biatrial enlargement Abnormal R-wave progression, early transition Borderline repolarization abnormality Confirmed by Virgina Norfolk [phone]) on 12/27/2019 9:20:33 PM   Radiology DG Chest 2 View  Result Date: 12/27/2019 CLINICAL DATA:  Altered mental status. EXAM: CHEST - 2 VIEW COMPARISON:  May 10, 2016 FINDINGS: There is no evidence of an acute infiltrate or pneumothorax. Mild, stable blunting of the left costophrenic angle is seen. The heart size and mediastinal contours are within  normal limits. There is moderate severity calcification of the aortic arch. The visualized skeletal structures are unremarkable. IMPRESSION: Stable exam without active cardiopulmonary disease. Electronically Signed   By: Aram Candela M.D.   On: 12/27/2019 22:20   CT Head Wo Contrast  Result Date: 12/27/2019 CLINICAL DATA:  Altered mental status. EXAM: CT HEAD WITHOUT CONTRAST TECHNIQUE: Contiguous axial images were obtained from the base of the skull through the vertex without intravenous contrast. COMPARISON:  10/19/2014. FINDINGS: Brain: No evidence of acute infarction, hemorrhage, hydrocephalus, extra-axial collection or mass lesion/mass effect. Advanced white matter disease is noted bilaterally. There are bilateral chronic appearing lacunar infarcts. There are age-indeterminate but chronic appearing infarcts involving the bilateral inferior cerebellum. There is a small age-indeterminate infarct involving the right parietooccipital lobe measuring approximately 1.1 cm (axial series 2, image 16). This is new since 2016. Vascular: No hyperdense vessel or unexpected calcification. Skull: Normal. Negative for fracture or focal lesion. Sinuses/Orbits: No acute finding. Other: None. IMPRESSION: 1. No acute intracranial abnormality. 2. Age indeterminate infarct involving the left inferior cerebellum. 3. Small age-indeterminate infarct involving the right posterior parietooccipital lobe. This is new since 2016. 4. Chronic appearing infarct involving the inferior right cerebellum, new since 2016. 5. Chronic bilateral basal ganglia lacunar infarcts are again noted. Electronically Signed   By: Katherine Mantle M.D.   On: 12/27/2019 22:54    Procedures .Critical Care Performed by: Virgina Norfolk, DO Authorized by: Virgina Norfolk, DO   Critical care provider statement:    Critical care time (minutes):  35   Critical care was necessary to treat or prevent imminent or life-threatening deterioration of the  following  conditions:  Sepsis and CNS failure or compromise   Critical care was time spent personally by me on the following activities:  Blood draw for specimens, development of treatment plan with patient or surrogate, discussions with primary provider, evaluation of patient's response to treatment, obtaining history from patient or surrogate, examination of patient, ordering and performing treatments and interventions, ordering and review of laboratory studies, ordering and review of radiographic studies, pulse oximetry, review of old charts and re-evaluation of patient's condition   I assumed direction of critical care for this patient from another provider in my specialty: no     (including critical care time)  Medications Ordered in ED Medications  vancomycin (VANCOCIN) IVPB 1000 mg/200 mL premix (has no administration in time range)  sodium chloride 0.9 % bolus 1,000 mL (has no administration in time range)  sodium chloride flush (NS) 0.9 % injection 3 mL (3 mLs Intravenous Given 12/27/19 2205)  sodium chloride 0.9 % bolus 1,000 mL (1,000 mLs Intravenous New Bag/Given 12/27/19 2140)  ceFEPIme (MAXIPIME) 2 g in sodium chloride 0.9 % 100 mL IVPB (2 g Intravenous New Bag/Given 12/27/19 2204)    ED Course  I have reviewed the triage vital signs and the nursing notes.  Pertinent labs & imaging results that were available during my care of the patient were reviewed by me and considered in my medical decision making (see chart for details).    MDM Rules/Calculators/A&P                      Norman Mendez is a 74 year old male nonverbal at baseline who presents to the ED with EMS altered.  Patient with tachycardia, tachypnea.  99.8 rectal temp.  According to EMS patient currently undergoing chemotherapy for penile cancer.  However appears that he has not begun chemo yet.  He has had radiation.  Patient currently living in a hotel room by himself.  Daughter's came to visit the patient earlier today  and he seemed to be okay.  He was started on morphine for pain and given first dose today.  Daughter otherwise states that his living situation is very poor and has not been doing well from a mental status standpoint for the last several days maybe longer.  Overall patient appears mildly confused.  Is able to follow commands, moves all extremities.  No signs of infection in the GU area.  Coarse breath sounds with mild increased work of breathing.  Concern for possible sepsis we will empirically give antibiotics and IV fluids.  Will get head CT.  EKG shows sinus rhythm.  No ischemic changes.  Will reevaluate after work-up.  Anticipate admission given mental status change, poor living condition, concern for sepsis.  Could be polypharmacy as well.  Patient with leukocytosis of 20, lactic acid 2.3.  Chest x-ray without signs of infection.  Covid negative.  Otherwise lab work unremarkable.  Urinalysis is pending.  Suspect likely UTI causing sepsis versus polypharmacy.  Possibly occult bacteremia.  CT scan shows multiple age-indeterminate chronic infarcts.  Could be the cause of his poor decline over the last several weeks and months as well.  Overall patient is not safe for discharge concerned that he has sepsis.  Recommend admission for further antibiotics, treatment.  Vital signs have improved following IV fluids.  This chart was dictated using voice recognition software.  Despite best efforts to proofread,  errors can occur which can change the documentation meaning.    Final Clinical Impression(s) / ED Diagnoses  Final diagnoses:  Altered mental status, unspecified altered mental status type  Sepsis, due to unspecified organism, unspecified whether acute organ dysfunction present [facility])    Rx / DC Orders ED Discharge Orders    None       Virgina Norfolk, DO 12/27/19 2305

## 2019-12-27 NOTE — Progress Notes (Addendum)
A consult was received from an ED physician for vanc/cefepime per pharmacy dosing.  The patient's profile has been reviewed for ht/wt/allergies/indication/available labs.   A one time order has been placed for vanc 1g and cefepime 2g.  Further antibiotics/pharmacy consults should be ordered by admitting physician if indicated.                       Thank you, Kara Mead 12/27/2019  9:14 PM

## 2019-12-27 NOTE — ED Triage Notes (Signed)
Pt comes to ed via ems for  AMS, pt found on floor covered in defecation and warm to touch. Last known well this morning. Pt comes from home and is currently in chemo treatment for prostate cancer. Daughter came to visit him at his home and found him. V/s on arrival 148/80, sinus tach 124, spo2 96 on 3 liters, was 86 spo2 at room air. Rr22, cbg 288.  Daughter will be calling here shortly.  No number at this time.  Pt uses a walker. Pt is non verbal and grunts.

## 2019-12-28 ENCOUNTER — Observation Stay (HOSPITAL_COMMUNITY): Payer: Medicare Other

## 2019-12-28 ENCOUNTER — Encounter (HOSPITAL_COMMUNITY): Payer: Self-pay | Admitting: Internal Medicine

## 2019-12-28 ENCOUNTER — Other Ambulatory Visit: Payer: Self-pay

## 2019-12-28 DIAGNOSIS — D630 Anemia in neoplastic disease: Secondary | ICD-10-CM | POA: Diagnosis present

## 2019-12-28 DIAGNOSIS — Z79891 Long term (current) use of opiate analgesic: Secondary | ICD-10-CM | POA: Diagnosis not present

## 2019-12-28 DIAGNOSIS — N390 Urinary tract infection, site not specified: Secondary | ICD-10-CM | POA: Diagnosis present

## 2019-12-28 DIAGNOSIS — R531 Weakness: Secondary | ICD-10-CM

## 2019-12-28 DIAGNOSIS — Z515 Encounter for palliative care: Secondary | ICD-10-CM

## 2019-12-28 DIAGNOSIS — R6520 Severe sepsis without septic shock: Secondary | ICD-10-CM | POA: Diagnosis present

## 2019-12-28 DIAGNOSIS — G9341 Metabolic encephalopathy: Secondary | ICD-10-CM | POA: Diagnosis present

## 2019-12-28 DIAGNOSIS — I69322 Dysarthria following cerebral infarction: Secondary | ICD-10-CM | POA: Diagnosis not present

## 2019-12-28 DIAGNOSIS — C609 Malignant neoplasm of penis, unspecified: Secondary | ICD-10-CM | POA: Diagnosis not present

## 2019-12-28 DIAGNOSIS — I10 Essential (primary) hypertension: Secondary | ICD-10-CM

## 2019-12-28 DIAGNOSIS — Z7189 Other specified counseling: Secondary | ICD-10-CM | POA: Diagnosis not present

## 2019-12-28 DIAGNOSIS — I2510 Atherosclerotic heart disease of native coronary artery without angina pectoris: Secondary | ICD-10-CM

## 2019-12-28 DIAGNOSIS — Z7982 Long term (current) use of aspirin: Secondary | ICD-10-CM | POA: Diagnosis not present

## 2019-12-28 DIAGNOSIS — F329 Major depressive disorder, single episode, unspecified: Secondary | ICD-10-CM | POA: Diagnosis present

## 2019-12-28 DIAGNOSIS — Z6820 Body mass index (BMI) 20.0-20.9, adult: Secondary | ICD-10-CM | POA: Diagnosis not present

## 2019-12-28 DIAGNOSIS — I252 Old myocardial infarction: Secondary | ICD-10-CM | POA: Diagnosis not present

## 2019-12-28 DIAGNOSIS — R52 Pain, unspecified: Secondary | ICD-10-CM

## 2019-12-28 DIAGNOSIS — Z923 Personal history of irradiation: Secondary | ICD-10-CM | POA: Diagnosis not present

## 2019-12-28 DIAGNOSIS — Z66 Do not resuscitate: Secondary | ICD-10-CM | POA: Diagnosis present

## 2019-12-28 DIAGNOSIS — E785 Hyperlipidemia, unspecified: Secondary | ICD-10-CM | POA: Diagnosis present

## 2019-12-28 DIAGNOSIS — R64 Cachexia: Secondary | ICD-10-CM | POA: Diagnosis present

## 2019-12-28 DIAGNOSIS — Z87891 Personal history of nicotine dependence: Secondary | ICD-10-CM | POA: Diagnosis not present

## 2019-12-28 DIAGNOSIS — Z590 Homelessness: Secondary | ICD-10-CM | POA: Diagnosis not present

## 2019-12-28 DIAGNOSIS — Z20822 Contact with and (suspected) exposure to covid-19: Secondary | ICD-10-CM | POA: Diagnosis present

## 2019-12-28 DIAGNOSIS — Z9221 Personal history of antineoplastic chemotherapy: Secondary | ICD-10-CM | POA: Diagnosis not present

## 2019-12-28 DIAGNOSIS — A419 Sepsis, unspecified organism: Secondary | ICD-10-CM | POA: Diagnosis present

## 2019-12-28 DIAGNOSIS — Z8673 Personal history of transient ischemic attack (TIA), and cerebral infarction without residual deficits: Secondary | ICD-10-CM | POA: Diagnosis not present

## 2019-12-28 DIAGNOSIS — Z79899 Other long term (current) drug therapy: Secondary | ICD-10-CM | POA: Diagnosis not present

## 2019-12-28 LAB — BASIC METABOLIC PANEL
Anion gap: 4 — ABNORMAL LOW (ref 5–15)
BUN: 16 mg/dL (ref 8–23)
CO2: 23 mmol/L (ref 22–32)
Calcium: 7.5 mg/dL — ABNORMAL LOW (ref 8.9–10.3)
Chloride: 113 mmol/L — ABNORMAL HIGH (ref 98–111)
Creatinine, Ser: 0.97 mg/dL (ref 0.61–1.24)
GFR calc Af Amer: >60 mL/min (ref >60)
GFR calc non Af Amer: >60 mL/min (ref >60)
Glucose, Bld: 130 mg/dL — ABNORMAL HIGH (ref 70–99)
Potassium: 3.8 mmol/L (ref 3.5–5.1)
Sodium: 140 mmol/L (ref 135–145)

## 2019-12-28 LAB — URINALYSIS, ROUTINE W REFLEX MICROSCOPIC
Bilirubin Urine: NEGATIVE
Glucose, UA: NEGATIVE mg/dL
Ketones, ur: 5 mg/dL — AB
Nitrite: NEGATIVE
Protein, ur: 100 mg/dL — AB
RBC / HPF: >50 RBC/hpf — ABNORMAL HIGH (ref 0–5)
Specific Gravity, Urine: 1.018 (ref 1.005–1.030)
WBC, UA: >50 WBC/hpf — ABNORMAL HIGH (ref 0–5)
pH: 5 (ref 5.0–8.0)

## 2019-12-28 LAB — CBC
HCT: 30.4 % — ABNORMAL LOW (ref 39.0–52.0)
Hemoglobin: 9.3 g/dL — ABNORMAL LOW (ref 13.0–17.0)
MCH: 28.1 pg (ref 26.0–34.0)
MCHC: 30.6 g/dL (ref 30.0–36.0)
MCV: 91.8 fL (ref 80.0–100.0)
Platelets: 342 10*3/uL (ref 150–400)
RBC: 3.31 MIL/uL — ABNORMAL LOW (ref 4.22–5.81)
RDW: 15.1 % (ref 11.5–15.5)
WBC: 18.8 10*3/uL — ABNORMAL HIGH (ref 4.0–10.5)
nRBC: 0 % (ref 0.0–0.2)

## 2019-12-28 LAB — RESPIRATORY PANEL BY RT PCR (FLU A&B, COVID)
Influenza A by PCR: NEGATIVE
Influenza B by PCR: NEGATIVE
SARS Coronavirus 2 by RT PCR: NEGATIVE

## 2019-12-28 LAB — LACTIC ACID, PLASMA: Lactic Acid, Venous: 1.7 mmol/L (ref 0.5–1.9)

## 2019-12-28 MED ORDER — ACETAMINOPHEN 325 MG PO TABS: 650.0000 mg | ORAL_TABLET | Freq: Four times a day (QID) | ORAL | Status: DC | PRN

## 2019-12-28 MED ORDER — LISINOPRIL 10 MG PO TABS
10.0000 mg | ORAL_TABLET | Freq: Every day | ORAL | Status: DC
  Administered 2019-12-28 – 2019-12-30 (×3): 10 mg via ORAL
  Filled 2019-12-28 (×3): qty 1

## 2019-12-28 MED ORDER — ONDANSETRON HCL 4 MG/2ML IJ SOLN: 4.0000 mg | Freq: Four times a day (QID) | INTRAMUSCULAR | Status: DC | PRN

## 2019-12-28 MED ORDER — ENOXAPARIN SODIUM 40 MG/0.4ML ~~LOC~~ SOLN
40.0000 mg | Freq: Every day | SUBCUTANEOUS | Status: DC
  Administered 2019-12-28 – 2019-12-30 (×3): 40 mg via SUBCUTANEOUS
  Filled 2019-12-28 (×3): qty 0.4

## 2019-12-28 MED ORDER — METOPROLOL TARTRATE 25 MG PO TABS
25.0000 mg | ORAL_TABLET | Freq: Two times a day (BID) | ORAL | Status: DC
  Administered 2019-12-28 – 2019-12-30 (×5): 25 mg via ORAL
  Filled 2019-12-28 (×5): qty 1

## 2019-12-28 MED ORDER — HYDRALAZINE HCL 25 MG PO TABS: 25.0000 mg | ORAL_TABLET | Freq: Four times a day (QID) | ORAL | Status: DC | PRN

## 2019-12-28 MED ORDER — SODIUM CHLORIDE 0.9 % IV BOLUS
1000.0000 mL | Freq: Once | INTRAVENOUS | Status: AC
  Administered 2019-12-28: 1000 mL via INTRAVENOUS

## 2019-12-28 MED ORDER — ONDANSETRON HCL 4 MG PO TABS: 4.0000 mg | ORAL_TABLET | Freq: Four times a day (QID) | ORAL | Status: DC | PRN

## 2019-12-28 MED ORDER — SODIUM CHLORIDE 0.9 % IV SOLN: INTRAVENOUS | Status: AC

## 2019-12-28 MED ORDER — ACETAMINOPHEN 650 MG RE SUPP: 650.0000 mg | Freq: Four times a day (QID) | RECTAL | Status: DC | PRN

## 2019-12-28 MED ORDER — HYDROMORPHONE HCL 1 MG/ML IJ SOLN
0.5000 mg | INTRAMUSCULAR | Status: DC | PRN
  Administered 2019-12-28 – 2019-12-29 (×2): 0.5 mg via INTRAVENOUS
  Filled 2019-12-28 (×2): qty 0.5

## 2019-12-28 MED ORDER — SODIUM CHLORIDE 0.9 % IV SOLN
1.0000 g | INTRAVENOUS | Status: DC
  Administered 2019-12-28 – 2019-12-30 (×3): 1 g via INTRAVENOUS
  Filled 2019-12-28 (×2): qty 10
  Filled 2019-12-28 (×2): qty 1

## 2019-12-28 MED ORDER — OXYCODONE HCL 5 MG PO TABS
5.0000 mg | ORAL_TABLET | ORAL | Status: DC | PRN
  Administered 2019-12-28 – 2019-12-29 (×3): 5 mg via ORAL
  Filled 2019-12-28 (×3): qty 1

## 2019-12-28 MED ORDER — ASPIRIN 81 MG PO CHEW
81.0000 mg | CHEWABLE_TABLET | Freq: Every day | ORAL | Status: DC
  Administered 2019-12-28 – 2019-12-30 (×3): 81 mg via ORAL
  Filled 2019-12-28 (×3): qty 1

## 2019-12-28 NOTE — Progress Notes (Signed)
PROGRESS NOTE    Norman Mendez  ZLD:[id number] DOB: [birth year] DOA: 12/27/2019 PCP: Patriciaann Clan, DO    Brief Narrative:  Norman Mendez is a 74 y.o. 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  Patient unable to provide history therefore entirety of history is supplemented by EDP, chart review, and patient's daughter by phone.  Patient has known metastatic penile cancer and recently started on palliative radiation.  He was not considered a surgical candidate.  He has been having significant uncontrolled pain and was started on MS Contin to try to help alleviate it.  He is currently staying in a hotel room.  His daughter has been helping give his medications and when she went over there this morning she gave him 1 tablet of MS Contin and left 2 tablets for later.  When she returned to see him he did not answer when she knocked on the door.  She saw him slumped over on a table.  She was able to get inside and noted that he was altered and not speaking (she says he was speaking in the morning).  She says the 2 tablets of MS Contin were still there so she did not think he took extra pain medications.  She subsequently called 911 for further evaluation.  In the ED, BP 156/76, pulse 122, RR 24, temp 99.8 Fahrenheit rectally, SPO2 100% on room air. WBC 20.3, hemoglobin 9.3, platelets 363,000, sodium 139, potassium 3.6, bicarb 21, BUN 21, creatinine 1.26, AST 21, ALT 18, alk phos 70, total bilirubin 1.1, serum glucose 197, lactic acid 2.3, ammonia 25, lipase 16. Chest x-ray negative for acute infiltrate.  CT head without contrast negative for acute intracranial abnormality but does show age-indeterminate infarct involving left inferior cerebellum, small age-indeterminate  infarct involving right posterior parieto-occipital lobe, chronic appearing infarct involving right inferior cerebellum, and chronic bilateral basal ganglia lacunar infarcts. Patient was given 1 L normal saline and empiric IV vancomycin and cefepime.  The hospitalist service was consulted to admit for further evaluation and management.   Assessment & Plan:   Principal Problem:   Sepsis ([place]) Active Problems:   Hyperlipidemia   Essential hypertension   Penile cancer ([place])   History of CVA (cerebrovascular accident)   CAD (coronary artery disease)   Acute metabolic encephalopathy to sepsis from presumed UTI Patient presenting with tachycardia, tachypnea, leukocytosis, and elevated lactic acid.  Suspect presentation due to urinary infection.  Also consideration from side effect from pain medications.  Urinalysis with large leukoesterase, negative nitrite, greater than 50 WBCs, greater than 50 RBCs, rare bacteria.  Ammonia level 25, within normal limits.  Covid-19 -.  Lactic acid 2.3 improved to 1.7. --WBC 20.3-->18.8 --Blood cultures x2: Pending --Urine culture: Pending --Continue ceftriaxone 1 g IV every 24 hours  Stage IV penile squamous cell carcinoma: Follows with oncology, Dr. Alen Blew, and radiation oncology, Dr. Tammi Klippel. Recently started on palliative radiation.  CT renal stone study on 12/28/2019 notable for worsening heterogeneous soft tissue density over the penile shaft likely representing progression of his known cancer with worsening bilateral superficial inguinal adenopathy with persistent periaortic and iliac chain adenopathy with some nodes larger, 2 new nodules right lower lobe largest measuring 1.0 cm suspicious for metastatic disease. --Hold home MS Contin 15 mg q12h, and Percocet 7.5-325 q4h prn for now --Pain control  with [place] IR 5 mg q4h prn moderate pain and Dilaudid 0.5 mg IV q4h prn for severe breakthrough pain --Consult palliative care for assistance with goals of care  and medical decision making given patient's extremely grim prognosis with notable disease progression on imaging study and likely candidate for hospice  History of CVA/hyperlipidemia: With reported baseline dysarthria.  CT head is without acute intracranial abnormality but does show age-indeterminate infarct involving left inferior cerebellum, right posterior parieto-occipital lobe, and chronic appearing inferior right cerebellum and bilateral basal ganglia lacunar infarcts. Previously on atorvastatin, no longer taking per med reconciliation (recent outpatient notes state he was taking 2-3 tablets of atorvastatin 80 mg erroneously) --Continue aspirin  CAD: --Continue aspirin.  Anemia: Hemoglobin 9.3 on admission without obvious bleeding.  Continue to monitor.  Hypertension: Continue home Lopressor and lisinopril  Generalized weakness: Daughter states that patient appears too weak to care for self or ambulate.  She feels he may need placement in a nursing facility. --PT/OT eval  DVT prophylaxis: Lovenox Code Status: Full code - verified again with patient's daughter Norman Mendez on 5/2 Family Communication: Updated patient's daughter Norman Mendez by telephone this morning  Disposition Plan:  Status is: Observation  The patient will require care spanning > 2 midnights and should be moved to inpatient because: Ongoing active pain requiring inpatient pain management, Altered mental status, Ongoing diagnostic testing needed not appropriate for outpatient work up, Unsafe d/c plan, IV treatments appropriate due to intensity of illness or inability to take PO and Inpatient level of care appropriate due to severity of illness  Dispo: The patient is from: Home [facility])              Anticipated d/c is to: To be determined, likely would benefit from hospice              Anticipated d/c date is: 2 days              Patient currently is not medically stable to d/c.    Consultants:   Palliative  care  Procedures:   None  Antimicrobials:   Vancomycin 5/1 - 5/1  Cefepime 5/1 -5/1  Azithromycin 5/1 - 5/1  Ceftriaxone 5/2>>    Subjective: Patient seen and examined at bedside this morning, continues in ED holding.  Patient is nonverbal and continues to be confused.  Unable to obtain any further ROS given his current mental status.  Updated patient's daughter, Norman Mendez via telephone this morning.  Discussed with her findings of likely sepsis from UTI and worsening metastatic disease process noted on CT abdomen/pelvis.  She would like to continue full CODE STATUS at this time until she consults with other family members.  No other questions at this time.  Updated by nursing staff this morning, 9:30 AM regarding improvement of mental status, and now alert and somewhat oriented.  Objective: Vitals:   12/28/19 0630 12/28/19 0715 12/28/19 0847 12/28/19 0852  BP: (!) 172/80 (!) 159/74 (!) 159/97   Pulse: (!) 101 (!) 101 (!) 117 100  Resp: _0 Temp:   98 F (36.7 C)   TempSrc:   Oral   SpO2: 97% 98% 100%   Height:       No intake or output data in the 24 hours ending 12/28/19 0919 There were no vitals filed for this visit.  Examination:  General exam: Appears calm and comfortable, confused, thin/cachectic in appearance Respiratory system: Clear to auscultation. Respiratory effort normal.  On room air Cardiovascular system:  S1 & S2 heard, RRR. No JVD, murmurs, rubs, gallops or clicks. No pedal edema. Gastrointestinal system: Abdomen is nondistended, soft and mild generalized tenderness to palpation. No organomegaly or masses felt. Normal bowel sounds heard. Central nervous system: Somnolent, confused, moves all extremities independently but not to command Extremities: Symmetric 5 x 5 power. Skin: No rashes, lesions or ulcers Psychiatry: Unable to assess secondary to mental status    Data Reviewed: I have personally reviewed following labs and imaging  studies  CBC: Recent Labs  Lab 12/27/19 2108 12/28/19 0500  WBC 20.3* 18.8*  NEUTROABS 18.9*  --   HGB 9.3* 9.3*  HCT 31.1* 30.4*  MCV 93.4 91.8  PLT 363 562   Basic Metabolic Panel: Recent Labs  Lab 12/27/19 2108 12/28/19 0500  NA 139 140  K 3.6 3.8  CL 108 113*  CO2 21* 23  GLUCOSE 197* 130*  BUN 21 16  CREATININE 1.26* 0.97  CALCIUM 8.1* 7.5*   GFR: Estimated Creatinine Clearance: 60.9 mL/min (by C-G formula based on SCr of 0.97 mg/dL). Liver Function Tests: Recent Labs  Lab 12/27/19 2108  AST 21  ALT 18  ALKPHOS 70  BILITOT 1.1  PROT 7.1  ALBUMIN 2.7*   Recent Labs  Lab 12/27/19 2112  LIPASE 16   Recent Labs  Lab 12/27/19 2114  AMMONIA 25   Coagulation Profile: Recent Labs  Lab 12/27/19 2108  INR 1.2   Cardiac Enzymes: No results for input(s): CKTOTAL, CKMB, CKMBINDEX, TROPONINI in the last 168 hours. BNP (last 3 results) No results for input(s): PROBNP in the last 8760 hours. HbA1C: No results for input(s): HGBA1C in the last 72 hours. CBG: Recent Labs  Lab 12/27/19 2111  GLUCAP 164*   Lipid Profile: No results for input(s): CHOL, HDL, LDLCALC, TRIG, CHOLHDL, LDLDIRECT in the last 72 hours. Thyroid Function Tests: No results for input(s): TSH, T4TOTAL, FREET4, T3FREE, THYROIDAB in the last 72 hours. Anemia Panel: No results for input(s): VITAMINB12, FOLATE, FERRITIN, TIBC, IRON, RETICCTPCT in the last 72 hours. Sepsis Labs: Recent Labs  Lab 12/27/19 2108 12/27/19 2306  LATICACIDVEN 2.3* 1.7    Recent Results (from the past 240 hour(s))  Respiratory Panel by RT PCR (Flu A&B, Covid) - Nasopharyngeal Swab     Status: None   Collection Time: 12/27/19 10:34 PM   Specimen: Nasopharyngeal Swab  Result Value Ref Range Status   SARS Coronavirus 2 by RT PCR NEGATIVE NEGATIVE Final    Comment: (NOTE) SARS-CoV-2 target nucleic acids are NOT DETECTED. The SARS-CoV-2 RNA is generally detectable in upper respiratoy specimens during the  acute phase of infection. The lowest concentration of SARS-CoV-2 viral copies this assay can detect is 131 copies/mL. A negative result does not preclude SARS-Cov-2 infection and should not be used as the sole basis for treatment or other patient management decisions. A negative result may occur with  improper specimen collection/handling, submission of specimen other than nasopharyngeal swab, presence of viral mutation(s) within the areas targeted by this assay, and inadequate number of viral copies (<131 copies/mL). A negative result must be combined with clinical observations, patient history, and epidemiological information. The expected result is Negative. Fact Sheet for Patients:  PinkCheek.be Fact Sheet for Healthcare Providers:  GravelBags.it This test is not yet ap proved or cleared by the [place] FDA and  has been authorized for detection and/or diagnosis of SARS-CoV-2 by FDA under an Emergency Use Authorization (EUA). This EUA will remain  in effect (meaning this test can be  used) for the duration of the COVID-19 declaration under Section 564(b)(1) of the Act, 21 U.S.C. section 360bbb-3(b)(1), unless the authorization is terminated or revoked sooner.    Influenza A by PCR NEGATIVE NEGATIVE Final   Influenza B by PCR NEGATIVE NEGATIVE Final    Comment: (NOTE) The Xpert Xpress SARS-CoV-2/FLU/RSV assay is intended as an aid in  the diagnosis of influenza from Nasopharyngeal swab specimens and  should not be used as a sole basis for treatment. Nasal washings and  aspirates are unacceptable for Xpert Xpress SARS-CoV-2/FLU/RSV  testing. Fact Sheet for Patients: PinkCheek.be Fact Sheet for Healthcare Providers: GravelBags.it This test is not yet approved or cleared by the [place] FDA and  has been authorized for detection and/or diagnosis of SARS-CoV-2  by  FDA under an Emergency Use Authorization (EUA). This EUA will remain  in effect (meaning this test can be used) for the duration of the  Covid-19 declaration under Section 564(b)(1) of the Act, 21  U.S.C. section 360bbb-3(b)(1), unless the authorization is  terminated or revoked. Performed at [facility], [place] [address], [place], [place] [address]          Radiology Studies: DG Chest 2 View  Result Date: 12/27/2019 CLINICAL DATA:  Altered mental status. EXAM: CHEST - 2 VIEW COMPARISON:  May 10, 2016 FINDINGS: There is no evidence of an acute infiltrate or pneumothorax. Mild, stable blunting of the left costophrenic angle is seen. The heart size and mediastinal contours are within normal limits. There is moderate severity calcification of the aortic arch. The visualized skeletal structures are unremarkable. IMPRESSION: Stable exam without active cardiopulmonary disease. Electronically Signed   By: Virgina Norfolk M.D.   On: 12/27/2019 22:20   CT Head Wo Contrast  Result Date: 12/27/2019 CLINICAL DATA:  Altered mental status. EXAM: CT HEAD WITHOUT CONTRAST TECHNIQUE: Contiguous axial images were obtained from the base of the skull through the vertex without intravenous contrast. COMPARISON:  10/19/2014. FINDINGS: Brain: No evidence of acute infarction, hemorrhage, hydrocephalus, extra-axial collection or mass lesion/mass effect. Advanced white matter disease is noted bilaterally. There are bilateral chronic appearing lacunar infarcts. There are age-indeterminate but chronic appearing infarcts involving the bilateral inferior cerebellum. There is a small age-indeterminate infarct involving the right parietooccipital lobe measuring approximately 1.1 cm (axial series 2, image 16). This is new since 2016. Vascular: No hyperdense vessel or unexpected calcification. Skull: Normal. Negative for fracture or focal lesion. Sinuses/Orbits: No acute finding. Other: None.  IMPRESSION: 1. No acute intracranial abnormality. 2. Age indeterminate infarct involving the left inferior cerebellum. 3. Small age-indeterminate infarct involving the right posterior parietooccipital lobe. This is new since 2016. 4. Chronic appearing infarct involving the inferior right cerebellum, new since 2016. 5. Chronic bilateral basal ganglia lacunar infarcts are again noted. Electronically Signed   By: Constance Holster M.D.   On: 12/27/2019 22:54   CT RENAL STONE STUDY  Result Date: 12/28/2019 CLINICAL DATA:  Hematuria. Known metastatic penile cancer recently began radiation. Patient unable to give history. EXAM: CT ABDOMEN AND PELVIS WITHOUT CONTRAST TECHNIQUE: Multidetector CT imaging of the abdomen and pelvis was performed following the standard protocol without IV contrast. COMPARISON:  08/27/2019 FINDINGS: Lower chest: Lung bases demonstrate a new 7-8 mm nodule over the posteromedial right lower lobe as well as new 1 cm pleural base nodule over the posterior right lower lobe. Tiny amount of bilateral pleural fluid. Calcification over the coronary arteries. Hepatobiliary: Liver, gallbladder and biliary tree are unremarkable. Pancreas: Normal. Spleen: Normal.  Adrenals/Urinary Tract: Adrenal glands are normal. Kidneys are normal in size without nephrolithiasis or focal mass. No hydronephrosis. Ureters and bladder are normal. Stomach/Bowel: Stomach and small bowel are normal. Appendix is normal. Colon is unremarkable. Vascular/Lymphatic: Mild-to-moderate calcified plaque over the abdominal aorta which is normal in caliber. Again noted is mild periaortic adenopathy as some of these nodes are unchanged and some slightly increased in size while others have decreased in size including near complete resolution of previously seen necrotic 2.1 cm right periaortic lymph node. No significant change in bilateral external iliac chain adenopathy. Worsening bilateral superficial inguinal adenopathy right worse than  left. Reproductive: Prostate is normal. Increased heterogeneous soft tissue density over the penile shaft likely patient's worsening of patient's known cancer. Other: Subcutaneous edema over the right upper leg and right pelvis which is new. Musculoskeletal: No new findings. IMPRESSION: 1. Worsening heterogeneous soft tissue density over the penile shaft likely representing progression of patient's known cancer and possibly the cause of patient's hematuria. Evidence of known metastatic disease with worsening bilateral superficial inguinal adenopathy. Persistent periaortic and iliac chain adenopathy with some nodes larger, some unchanged and some improved/resolved. 2. Two new nodules in the right lower lobe with the larger measuring 1 cm suspicious for metastatic disease. 3. Aortic Atherosclerosis (ICD10-I70.0). Atherosclerotic coronary artery disease. Electronically Signed   By: Marin Olp M.D.   On: 12/28/2019 08:27        Scheduled Meds:  aspirin  81 mg Oral Daily   enoxaparin (LOVENOX) injection  40 mg Subcutaneous Daily   lisinopril  10 mg Oral Daily   metoprolol tartrate  25 mg Oral BID   Continuous Infusions:  sodium chloride 100 mL/hr at 12/28/19 0104   cefTRIAXone (ROCEPHIN)  IV 1 g (12/28/19 0553)     LOS: 0 days    Time spent: 39 minutes spent on chart review, discussion with nursing staff, consultants, updating family and interview/physical exam; more than 50% of that time was spent in counseling and/or coordination of care.    Nimra Puccinelli J [place] DO [facility] Available via Epic secure chat 7am-7pm After these hours, please refer to coverage provider listed on [email or web address] 12/28/2019, 9:19 AM

## 2019-12-28 NOTE — Consult Note (Signed)
Consultation Note Date: 12/28/2019   Patient Name: Norman Mendez  DOB: [birth year]  MRN: [id number]  Age / Sex: 74 y.o., male  PCP: Patriciaann Clan, DO Referring Physician: [place] Eric J, DO  Reason for Consultation: Establishing goals of care  HPI/Patient Profile: 74 y.o. 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  Patient has known metastatic penile cancer and recently started on palliative radiation. He was not considered a surgical candidate. He has been having significant uncontrolled pain and was started on MS Contin to try to help alleviate it. He is currently staying in a hotel room. His daughter has been helping give his medications and when she went over there this morning she gave him 1 tablet of MS Contin and left 2 tablets for later. When she returned to see him he did not answer when she knocked on the door. She saw him slumped over on a table. She was able to get inside and noted that he was altered and not speaking (she says he was speaking in the morning). She says the 2 tablets of MS Contin were still there so she did not think he took extra pain medications. She subsequently called 911 for further evaluation.  In the ED, CT head without contrast negative for acute intracranial abnormality but does show age-indeterminate infarct involving left inferior cerebellum, small age-indeterminate infarct involving right posterior parieto-occipital lobe, chronic  appearing infarct involving right inferior cerebellum, and chronic bilateral basal ganglia lacunar infarcts. Patient was given 1 L normal saline and empiric IV vancomycin and cefepime.   Clinical Assessment and Goals of Care: Patient admitted under hospital medicine service for acute metabolic encephalopathy secondary to sepsis from presumed urinary source. His life limiting illness is stage IV penile squamous carcinoma. He also has has a history of stroke and HLD. His recent imaging CT renal stone study on 12/28/2019 notable for worsening heterogeneous soft tissue density over the penile shaft likely representing progression of his known cancer with worsening bilateral superficial inguinal adenopathy with persistent periaortic and iliac chain adenopathy with some nodes larger, 2 new nodules right lower lobe largest measuring 1.0 cm suspicious for metastatic disease.    A palliative consult has been requested for ongoing supportive care, pain management, goals of care discussions.  Norman Mendez is resting in bed.  He arouses minimally.  2 daughters are present at the bedside.  They state that the patient was awake a little while ago and was complaining of pain.  I introduced myself and palliative care as follows:  Palliative medicine is specialized medical care for people living with serious illness. It focuses on providing relief from the symptoms and stress of a serious illness.  The goal is to improve quality of life for both the patient and the family.  Goals of care: Broad aims of medical therapy in relation to the patient's values and preferences. Our aim is to provide medical care aimed at enabling patients to achieve the goals that matter most to them, given the circumstances of their particular medical situation and their constraints.   We reviewed about the patient's current condition, talked about his serious underlying life limiting illness of stage IV penile squamous cell cancer.  We discussed  about current opioid regimen.  Agree with and continue OxyIR as well as Dilaudid IV as needed.  Patient has been given 1 dose of that recently.  Family states that the patient was only able to receive 1 dose of radiation.  He has had ongoing functional decline.  We spoke at length about CODE STATUS.  CODE STATUS has now been revised to DO NOT RESUSCITATE/DO NOT INTUBATE.  We discussed about possibility of patient having limited prognosis in the face of recent imaging showing progression as well as patient's ongoing functional decline.  I talked with him about what comfort focused care and hospice support would look like at this juncture.  See below.  NEXT OF KIN  4 children, separated from wife for a long time now.   SUMMARY OF RECOMMENDATIONS   CODE STATUS discussions undertaken with both daughters present at the bedside.  They have discussed with the patient's other 2 children as well.  We discussed about full code versus DNR in detail.  Expressed frankly but compassionately that in my opinion the patient will not benefit from aggressive measures at end-of-life, will not benefit from full scope of resuscitation.  CODE STATUS now revised to DO NOT RESUSCITATE as the patient's children have discussed amongst each other.  Continue current mode of care.  Recommend medical oncology follow-up.  Discussed with family about recent imaging showing disease progression.  I did discuss with them about comfort measures and hospice philosophy of care.  Code Status/Advance Care Planning:  DNR    Symptom Management:   as above.   Palliative Prophylaxis:   Delirium Protocol   Psycho-social/Spiritual:   Desire for further Chaplaincy support:yes  Additional Recommendations: Education on Hospice  Prognosis:   Unable to determine  Discharge Planning: To Be Determined      Primary Diagnoses: Present on Admission: . Sepsis ([place]) . Penile cancer ([place]) . Hyperlipidemia . Essential  hypertension   I have reviewed the medical record, interviewed the patient and family, and examined the patient. The following aspects are pertinent.  Past Medical History:  Diagnosis Date  . 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  Marland Kitchen Acute UTI 08/27/2019  . Cerebral thrombosis with cerebral infarction ([place]) 10/20/2014  . Complicated UTI (urinary tract infection) 08/28/2019  . CVA (cerebral infarction)   . Dysarthria due to cerebrovascular accident   . Hyperlipemia   . Hypertension   . Lymphadenopathy    lower extremities related to penile pain  . NSTEMI (non-ST elevated myocardial infarction) ([place])   . Penile cancer ([place])   . Penile pain 11/07/2019  . Shortness of breath dyspnea   . Sinus tachycardia 11/16/2011   Social History   Socioeconomic History  . Marital status: Single    Spouse name: Not on file  . Number of children: 4  . Years of education: Not on file  . Highest education level: Not on file  Occupational History    Comment:  Retired Secretary/administrator  Tobacco Use  . Smoking status: Former Smoker    Packs/day: 0.20    Types: Cigarettes    Quit date: 08/18/2014    Years since quitting: 5.3  . Smokeless tobacco: Never Used  . Tobacco comment: uncertain of when he began smoking thus duration is unknown  Substance and Sexual Activity  . Alcohol use: Not Currently  . Drug use: No  . Sexual activity: Not Currently  Other Topics Concern  . Not on file  Social History Narrative  . Not on file   Social Determinants of Health   Financial Resource Strain:   . Difficulty of Paying Living Expenses:   Food Insecurity:   . Worried About Charity fundraiser in the Last Year:   . Arboriculturist in the Last Year:   Transportation Needs:   . Film/video editor (Medical):   Marland Kitchen Lack of Transportation (Non-Medical):   Physical Activity:   . Days of Exercise per Week:   . Minutes of Exercise per Session:   Stress:   .  Feeling of Stress :   Social Connections:   . Frequency of Communication with Friends and Family:   . Frequency of Social Gatherings with Friends and Family:   . Attends Religious Services:   . Active Member of Clubs or Organizations:   . Attends Archivist Meetings:   Marland Kitchen Marital Status:    Family History  Problem Relation Age of Onset  . Heart disease Other        No family history  . Cancer Neg Hx   . Breast cancer Neg Hx   . Penile cancer Neg Hx   . Colon cancer Neg Hx   . Pancreatic cancer Neg Hx    Scheduled Meds: . aspirin  81 mg Oral Daily  . enoxaparin (LOVENOX) injection  40 mg Subcutaneous Daily  . lisinopril  10 mg Oral Daily  . metoprolol tartrate  25 mg Oral BID   Continuous Infusions: . cefTRIAXone (ROCEPHIN)  IV 1 g (12/28/19 0553)   PRN Meds:.acetaminophen **OR** acetaminophen, hydrALAZINE, HYDROmorphone (DILAUDID) injection, ondansetron **OR** ondansetron (ZOFRAN) IV, oxyCODONE Medications Prior to Admission:  Prior to Admission medications   Medication Sig Start Date End Date Taking? Authorizing Provider  aspirin 81 MG chewable tablet Chew 1 tablet (81 mg total) by mouth daily. 12/31/18  Yes Orson Eva J, DO  docusate sodium (COLACE) 100 MG capsule Take 1 capsule (100 mg total) by mouth every 12 (twelve) hours. 11/22/19  Yes Charlesetta Shanks, MD  lisinopril (ZESTRIL) 10 MG tablet Take 1 tablet (10 mg total) by mouth daily. 12/09/19  Yes Guadalupe Dawn, MD  metoprolol tartrate (LOPRESSOR) 25 MG tablet Take 1 tablet (25 mg total) by mouth 2 (two) times daily. 12/09/19  Yes Guadalupe Dawn, MD  morphine (MS CONTIN) 15 MG 12 hr tablet Take 1 tablet (15 mg total) by mouth every 12 (twelve) hours. 12/25/19  Yes Gery Pray, MD  nystatin cream (MYCOSTATIN) Apply to affected area 2 times daily 11/22/19  Yes Pfeiffer, Jeannie Done, MD  polyethylene glycol (MIRALAX / GLYCOLAX) 17 g packet Take 17 g by mouth daily. 11/22/19  Yes Charlesetta Shanks, MD  oxyCODONE-acetaminophen  (PERCOCET) 7.5-325 MG tablet Take 1 tablet by mouth every 4 (four) hours as needed for severe pain. Patient not taking: Reported on 12/27/2019 12/09/19   Guadalupe Dawn, MD   No Known Allergies Review of Systems +pain Physical Exam Confused. Thin and cachectic appearing Shallow regular breathing  No distress Appears chronically ill S1-S2 Abdomen not distended No edema  Vital Signs: BP 138/64 (BP Location: Right Arm)   Pulse 87   Temp 98.1 F (36.7 C) (Oral)   Resp 16   Ht 5\' 10"  (1.778 m)   SpO2 99%   BMI 20.09 kg/m  Pain Scale: 0-10 POSS *See Group Information*: S-Acceptable,Sleep, easy to arouse Pain Score: 10-Worst pain ever   SpO2: SpO2: 99 % O2 Device:SpO2: 99 % O2 Flow Rate: .   IO: Intake/output summary: No intake or output data in the 24 hours ending 12/28/19 1258  LBM: Last BM Date: 12/27/19 Baseline Weight:   Most recent weight:       Palliative Assessment/Data:   PPS 30%  Time In:  12 Time Out:  1310 Time Total:   70 min.  Greater than 50%  of this time was spent counseling and coordinating care related to the above assessment and plan.  Signed by: Loistine Chance, MD   Please contact Palliative Medicine Team phone at [phone] for questions and concerns.  For individual provider: See Shea Evans

## 2019-12-29 ENCOUNTER — Ambulatory Visit
Admission: RE | Admit: 2019-12-29 | Discharge: 2019-12-29 | Disposition: A | Discharge status: Home / Self Care | Payer: Medicare Other | Source: Ambulatory Visit | Attending: Radiation Oncology | Admitting: Radiation Oncology

## 2019-12-29 ENCOUNTER — Encounter: Payer: Self-pay | Admitting: Urology

## 2019-12-29 ENCOUNTER — Encounter (HOSPITAL_COMMUNITY): Payer: Self-pay | Admitting: Internal Medicine

## 2019-12-29 DIAGNOSIS — C775 Secondary and unspecified malignant neoplasm of intrapelvic lymph nodes: Secondary | ICD-10-CM | POA: Insufficient documentation

## 2019-12-29 DIAGNOSIS — A419 Sepsis, unspecified organism: Principal | ICD-10-CM

## 2019-12-29 DIAGNOSIS — R531 Weakness: Secondary | ICD-10-CM

## 2019-12-29 DIAGNOSIS — Z7189 Other specified counseling: Secondary | ICD-10-CM

## 2019-12-29 DIAGNOSIS — C609 Malignant neoplasm of penis, unspecified: Secondary | ICD-10-CM | POA: Insufficient documentation

## 2019-12-29 DIAGNOSIS — Z515 Encounter for palliative care: Secondary | ICD-10-CM

## 2019-12-29 LAB — CBC
HCT: 29.6 % — ABNORMAL LOW (ref 39.0–52.0)
Hemoglobin: 8.9 g/dL — ABNORMAL LOW (ref 13.0–17.0)
MCH: 27.8 pg (ref 26.0–34.0)
MCHC: 30.1 g/dL (ref 30.0–36.0)
MCV: 92.5 fL (ref 80.0–100.0)
Platelets: 322 10*3/uL (ref 150–400)
RBC: 3.2 MIL/uL — ABNORMAL LOW (ref 4.22–5.81)
RDW: 14.9 % (ref 11.5–15.5)
WBC: 10.8 10*3/uL — ABNORMAL HIGH (ref 4.0–10.5)
nRBC: 0 % (ref 0.0–0.2)

## 2019-12-29 LAB — BASIC METABOLIC PANEL
Anion gap: 6 (ref 5–15)
BUN: 10 mg/dL (ref 8–23)
CO2: 24 mmol/L (ref 22–32)
Calcium: 7.8 mg/dL — ABNORMAL LOW (ref 8.9–10.3)
Chloride: 111 mmol/L (ref 98–111)
Creatinine, Ser: 0.84 mg/dL (ref 0.61–1.24)
GFR calc Af Amer: >60 mL/min (ref >60)
GFR calc non Af Amer: >60 mL/min (ref >60)
Glucose, Bld: 102 mg/dL — ABNORMAL HIGH (ref 70–99)
Potassium: 3.9 mmol/L (ref 3.5–5.1)
Sodium: 141 mmol/L (ref 135–145)

## 2019-12-29 LAB — MAGNESIUM: Magnesium: 2.1 mg/dL (ref 1.7–2.4)

## 2019-12-29 NOTE — Progress Notes (Signed)
Patient sat up in chair for a couple hours. He stood and used the urinal with assist. Medicated for pain prior to going to radiation. His wife is visiting him right now.

## 2019-12-29 NOTE — Progress Notes (Signed)
PROGRESS NOTE    Norman Mendez  TML:[id number] DOB: December 03, 1945 DOA: 12/27/2019 PCP: Patriciaann Clan, DO    Brief Narrative:  Norman Mendez is a 74 y.o. 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  Patient unable to provide history therefore entirety of history is supplemented by EDP, chart review, and patient's daughter by phone.  Patient has known metastatic penile cancer and recently started on palliative radiation.  He was not considered a surgical candidate.  He has been having significant uncontrolled pain and was started on MS Contin to try to help alleviate it.  He is currently staying in a hotel room.  His daughter has been helping give his medications and when she went over there this morning she gave him 1 tablet of MS Contin and left 2 tablets for later.  When she returned to see him he did not answer when she knocked on the door.  She saw him slumped over on a table.  She was able to get inside and noted that he was altered and not speaking (she says he was speaking in the morning).  She says the 2 tablets of MS Contin were still there so she did not think he took extra pain medications.  She subsequently called 911 for further evaluation.  In the ED, BP 156/76, pulse 122, RR 24, temp 99.8 Fahrenheit rectally, SPO2 100% on room air. WBC 20.3, hemoglobin 9.3, platelets 363,000, sodium 139, potassium 3.6, bicarb 21, BUN 21, creatinine 1.26, AST 21, ALT 18, alk phos 70, total bilirubin 1.1, serum glucose 197, lactic acid 2.3, ammonia 25, lipase 16. Chest x-ray negative for acute infiltrate.  CT head without contrast negative for acute intracranial abnormality but does show age-indeterminate infarct involving left inferior cerebellum, small age-indeterminate  infarct involving right posterior parieto-occipital lobe, chronic appearing infarct involving right inferior cerebellum, and chronic bilateral basal ganglia lacunar infarcts. Patient was given 1 L normal saline and empiric IV vancomycin and cefepime.  The hospitalist service was consulted to admit for further evaluation and management.   Assessment & Plan:   Principal Problem:   Sepsis ([place]) Active Problems:   Hyperlipidemia   Essential hypertension   Penile cancer ([place])   History of CVA (cerebrovascular accident)   CAD (coronary artery disease)   Palliative care by specialist   Goals of care, counseling/discussion   General weakness  Sepsis, present on admission Acute metabolic encephalopathy to sepsis from presumed UTI Patient presenting with tachycardia, tachypnea, leukocytosis, and elevated lactic acid.  Suspect presentation due to urinary infection.  Also consideration from side effect from pain medications.  Urinalysis with large leukoesterase, negative nitrite, greater than 50 WBCs, greater than 50 RBCs, rare bacteria.  Ammonia level 25, within normal limits.  Covid-19 -.  Lactic acid 2.3 improved to 1.7. --WBC 20.3-->18.8-->10.8 --Blood cultures x2: No growth x1 day --Urine culture: 1K GNR's --Continue ceftriaxone 1 g IV every 24 hours  Stage IV penile squamous cell carcinoma: Follows with oncology, Dr. Alen Blew, and radiation oncology, Dr. Tammi Klippel. Recently started on palliative radiation.  CT renal stone study on 12/28/2019 notable for worsening heterogeneous soft tissue density over the penile shaft likely representing progression of his known cancer with worsening bilateral superficial inguinal adenopathy with persistent periaortic and iliac chain adenopathy with some nodes larger, 2 new nodules right lower lobe largest  measuring 1.0 cm suspicious for metastatic disease.  Patient was seen by his primary oncologist, Dr. Alen Blew recommended supportive care and hospice enrollment with  likely residential hospice given he is not a candidate for aggressive chemotherapy given his overall health decline in functional status. Patient with ultimately extremely grim prognosis with notable disease progression on imaging study  --Palliative care following, appreciate assistance --Pain control with Oxy IR 5 mg q4h prn moderate pain and Dilaudid 0.5 mg IV q4h prn for severe breakthrough pain  History of CVA/hyperlipidemia: With reported baseline dysarthria.  CT head is without acute intracranial abnormality but does show age-indeterminate infarct involving left inferior cerebellum, right posterior parieto-occipital lobe, and chronic appearing inferior right cerebellum and bilateral basal ganglia lacunar infarcts. Previously on atorvastatin, no longer taking per med reconciliation (recent outpatient notes state he was taking 2-3 tablets of atorvastatin 80 mg erroneously) --Continue aspirin  CAD: --Continue aspirin.  Anemia of chronic disease, malignancy Hemoglobin 9.3 on admission without obvious bleeding.  Continue to monitor.  Hypertension: Continue home Lopressor and lisinopril  Generalized weakness: Daughter states that patient appears too weak to care for self or ambulate.    Transition of care consult for residential hospice placement.  DVT prophylaxis: Lovenox Code Status: DNR Family Communication: Updated patient extensively at bedside, updated patient's daughter Norman Mendez via telephone yesterday  Disposition Plan:  Status is: Inpatient, prognosis is poor/grim with limited life expectancy  The patient will require care spanning > 2 midnights and should be moved to inpatient because: Ongoing active pain requiring inpatient pain management, Altered mental status, Ongoing diagnostic testing needed not appropriate for outpatient work up, Unsafe d/c plan, IV treatments appropriate due to intensity of illness or inability to take PO and Inpatient level of care appropriate due to  severity of illness  Dispo: The patient is from: Home [facility])              Anticipated d/c is to: Residential hospice              Anticipated d/c date is: 1 day              Patient currently is not medically stable to d/c.    Consultants:   Palliative care  Medical oncology  Procedures:   None  Antimicrobials:   Vancomycin 5/1 - 5/1  Cefepime 5/1 -5/1  Azithromycin 5/1 - 5/1  Ceftriaxone 5/2>>    Subjective: Patient seen and examined at bedside, resting comfortably.  Very little verbal interaction this morning.  Continues with significant weakness, fatigue.  Seen by his primary oncologist morning with recommendations of supportive care and residential hospice.  Not a candidate for any aggressive measures including chemotherapy due to his functional decline and weakness.  No other complaints at this time.  States pain is controlled.  Denies chest pain, no palpitations, no shortness of breath, no abdominal pain.  No acute events overnight per nursing staff.  Objective: Vitals:   12/28/19 2054 12/29/19 0447 12/29/19 0453 12/29/19 0949  BP: (!) 147/67  (!) 142/83 132/78  Pulse: 95  (!) 104 96  Resp: 16  18   Temp: 98.1 F (36.7 C)  (!) 97.2 F (36.2 C)   TempSrc: Oral     SpO2: 99%  100%   Weight:  63.4 kg    Height:  5' 10"  (1.778 m)      Intake/Output Summary (Last 24 hours) at 12/29/2019 1249 Last data filed at 12/28/2019 2330 Gross per 24 hour  Intake 1815.7 ml  Output --  Net 1815.7 ml   Filed Weights   12/29/19 0447  Weight: 63.4 kg    Examination:  General exam: Appears calm and comfortable, confused, thin/cachectic in appearance Respiratory system: Clear to auscultation. Respiratory effort normal.  On room air Cardiovascular system: S1 & S2 heard, RRR. No JVD, murmurs, rubs, gallops or clicks. No pedal edema. Gastrointestinal system: Abdomen is nondistended, soft and mild generalized tenderness to palpation. No organomegaly or masses felt. Normal  bowel sounds heard. Central nervous system: Somnolent, confused, moves all extremities independently but not to command Extremities: Symmetric 5 x 5 power. Skin: No rashes, lesions or ulcers Psychiatry: Depressed mood, flat affect.  Judgment/insight appears poor.    Data Reviewed: I have personally reviewed following labs and imaging studies  CBC: Recent Labs  Lab 12/27/19 2108 12/28/19 0500 12/29/19 0529  WBC 20.3* 18.8* 10.8*  NEUTROABS 18.9*  --   --   HGB 9.3* 9.3* 8.9*  HCT 31.1* 30.4* 29.6*  MCV 93.4 91.8 92.5  PLT 363 342 026   Basic Metabolic Panel: Recent Labs  Lab 12/27/19 2108 12/28/19 0500 12/29/19 0529  NA 139 140 141  K 3.6 3.8 3.9  CL 108 113* 111  CO2 21* 23 24  GLUCOSE 197* 130* 102*  BUN 21 16 10   CREATININE 1.26* 0.97 0.84  CALCIUM 8.1* 7.5* 7.8*  MG  --   --  2.1   GFR: Estimated Creatinine Clearance: 70.2 mL/min (by C-G formula based on SCr of 0.84 mg/dL). Liver Function Tests: Recent Labs  Lab 12/27/19 2108  AST 21  ALT 18  ALKPHOS 70  BILITOT 1.1  PROT 7.1  ALBUMIN 2.7*   Recent Labs  Lab 12/27/19 2112  LIPASE 16   Recent Labs  Lab 12/27/19 2114  AMMONIA 25   Coagulation Profile: Recent Labs  Lab 12/27/19 2108  INR 1.2   Cardiac Enzymes: No results for input(s): CKTOTAL, CKMB, CKMBINDEX, TROPONINI in the last 168 hours. BNP (last 3 results) No results for input(s): PROBNP in the last 8760 hours. HbA1C: No results for input(s): HGBA1C in the last 72 hours. CBG: Recent Labs  Lab 12/27/19 2111  GLUCAP 164*   Lipid Profile: No results for input(s): CHOL, HDL, LDLCALC, TRIG, CHOLHDL, LDLDIRECT in the last 72 hours. Thyroid Function Tests: No results for input(s): TSH, T4TOTAL, FREET4, T3FREE, THYROIDAB in the last 72 hours. Anemia Panel: No results for input(s): VITAMINB12, FOLATE, FERRITIN, TIBC, IRON, RETICCTPCT in the last 72 hours. Sepsis Labs: Recent Labs  Lab 12/27/19 2108 12/27/19 2306  LATICACIDVEN  2.3* 1.7    Recent Results (from the past 240 hour(s))  Culture, blood (Routine x 2)     Status: None (Preliminary result)   Collection Time: 12/27/19  9:10 PM   Specimen: BLOOD  Result Value Ref Range Status   Specimen Description   Final    BLOOD LEFT WRIST Performed at [facility] [address], [place], [place] [address]    Special Requests   Final    BOTTLES DRAWN AEROBIC AND ANAEROBIC Blood Culture adequate volume Performed at [facility] [address] [place], [place] [address]    Culture   Final    NO GROWTH 1 DAY Performed at [facility], [place] [address] [place], [place] [address]    Report Status PENDING  Incomplete  Culture, blood (Routine x 2)     Status: None (Preliminary result)   Collection Time: 12/27/19  9:11 PM  Specimen: BLOOD  Result Value Ref Range Status   Specimen Description   Final    BLOOD RIGHT ANTECUBITAL Performed at [facility] [address], [place], [place] [address]    Special Requests   Final    BOTTLES DRAWN AEROBIC AND ANAEROBIC Blood Culture adequate volume Performed at [facility] [address], [place], [place] [address]    Culture   Final    NO GROWTH 1 DAY Performed at [facility], [place] [address] [place], [place] [address]    Report Status PENDING  Incomplete  Urine culture     Status: Abnormal (Preliminary result)   Collection Time: 12/27/19  9:11 PM   Specimen: In/Out Cath Urine  Result Value Ref Range Status   Specimen Description   Final    IN/OUT CATH URINE Performed at [facility] [address] [place], [place] [address]    Special Requests   Final    NONE Performed at [facility], [place] [address], [place],  [address]    Culture 1,000 COLONIES/mL GRAM NEGATIVE RODS (A)  Final   Report Status PENDING  Incomplete  Respiratory Panel by RT PCR (Flu A&B, Covid) -  Nasopharyngeal Swab     Status: None   Collection Time: 12/27/19 10:34 PM   Specimen: Nasopharyngeal Swab  Result Value Ref Range Status   SARS Coronavirus 2 by RT PCR NEGATIVE NEGATIVE Final    Comment: (NOTE) SARS-CoV-2 target nucleic acids are NOT DETECTED. The SARS-CoV-2 RNA is generally detectable in upper respiratoy specimens during the acute phase of infection. The lowest concentration of SARS-CoV-2 viral copies this assay can detect is 131 copies/mL. A negative result does not preclude SARS-Cov-2 infection and should not be used as the sole basis for treatment or other patient management decisions. A negative result may occur with  improper specimen collection/handling, submission of specimen other than nasopharyngeal swab, presence of viral mutation(s) within the areas targeted by this assay, and inadequate number of viral copies (<131 copies/mL). A negative result must be combined with clinical observations, patient history, and epidemiological information. The expected result is Negative. Fact Sheet for Patients:  PinkCheek.be Fact Sheet for Healthcare Providers:  GravelBags.it This test is not yet ap proved or cleared by the [place] FDA and  has been authorized for detection and/or diagnosis of SARS-CoV-2 by FDA under an Emergency Use Authorization (EUA). This EUA will remain  in effect (meaning this test can be used) for the duration of the COVID-19 declaration under Section 564(b)(1) of the Act, 21 U.S.C. section 360bbb-3(b)(1), unless the authorization is terminated or revoked sooner.    Influenza A by PCR NEGATIVE NEGATIVE Final   Influenza B by PCR NEGATIVE NEGATIVE Final    Comment: (NOTE) The Xpert Xpress SARS-CoV-2/FLU/RSV assay is intended as an aid in  the diagnosis of influenza from Nasopharyngeal swab specimens and  should not be used as a sole basis for treatment. Nasal washings and  aspirates  are unacceptable for Xpert Xpress SARS-CoV-2/FLU/RSV  testing. Fact Sheet for Patients: PinkCheek.be Fact Sheet for Healthcare Providers: GravelBags.it This test is not yet approved or cleared by the [place] FDA and  has been authorized for detection and/or diagnosis of SARS-CoV-2 by  FDA under an Emergency Use Authorization (EUA). This EUA will remain  in effect (meaning this test can be used) for the duration of the  Covid-19 declaration under Section 564(b)(1) of the Act, 21  U.S.C.  section 360bbb-3(b)(1), unless the authorization is  terminated or revoked. Performed at [facility], [place] [address] [place], [place] [address]          Radiology Studies: DG Chest 2 View  Result Date: 12/27/2019 CLINICAL DATA:  Altered mental status. EXAM: CHEST - 2 VIEW COMPARISON:  May 10, 2016 FINDINGS: There is no evidence of an acute infiltrate or pneumothorax. Mild, stable blunting of the left costophrenic angle is seen. The heart size and mediastinal contours are within normal limits. There is moderate severity calcification of the aortic arch. The visualized skeletal structures are unremarkable. IMPRESSION: Stable exam without active cardiopulmonary disease. Electronically Signed   By: Virgina Norfolk M.D.   On: 12/27/2019 22:20   CT Head Wo Contrast  Result Date: 12/27/2019 CLINICAL DATA:  Altered mental status. EXAM: CT HEAD WITHOUT CONTRAST TECHNIQUE: Contiguous axial images were obtained from the base of the skull through the vertex without intravenous contrast. COMPARISON:  10/19/2014. FINDINGS: Brain: No evidence of acute infarction, hemorrhage, hydrocephalus, extra-axial collection or mass lesion/mass effect. Advanced white matter disease is noted bilaterally. There are bilateral chronic appearing lacunar infarcts. There are age-indeterminate but chronic appearing infarcts involving the bilateral  inferior cerebellum. There is a small age-indeterminate infarct involving the right parietooccipital lobe measuring approximately 1.1 cm (axial series 2, image 16). This is new since 2016. Vascular: No hyperdense vessel or unexpected calcification. Skull: Normal. Negative for fracture or focal lesion. Sinuses/Orbits: No acute finding. Other: None. IMPRESSION: 1. No acute intracranial abnormality. 2. Age indeterminate infarct involving the left inferior cerebellum. 3. Small age-indeterminate infarct involving the right posterior parietooccipital lobe. This is new since 2016. 4. Chronic appearing infarct involving the inferior right cerebellum, new since 2016. 5. Chronic bilateral basal ganglia lacunar infarcts are again noted. Electronically Signed   By: Constance Holster M.D.   On: 12/27/2019 22:54   CT RENAL STONE STUDY  Result Date: 12/28/2019 CLINICAL DATA:  Hematuria. Known metastatic penile cancer recently began radiation. Patient unable to give history. EXAM: CT ABDOMEN AND PELVIS WITHOUT CONTRAST TECHNIQUE: Multidetector CT imaging of the abdomen and pelvis was performed following the standard protocol without IV contrast. COMPARISON:  08/27/2019 FINDINGS: Lower chest: Lung bases demonstrate a new 7-8 mm nodule over the posteromedial right lower lobe as well as new 1 cm pleural base nodule over the posterior right lower lobe. Tiny amount of bilateral pleural fluid. Calcification over the coronary arteries. Hepatobiliary: Liver, gallbladder and biliary tree are unremarkable. Pancreas: Normal. Spleen: Normal. Adrenals/Urinary Tract: Adrenal glands are normal. Kidneys are normal in size without nephrolithiasis or focal mass. No hydronephrosis. Ureters and bladder are normal. Stomach/Bowel: Stomach and small bowel are normal. Appendix is normal. Colon is unremarkable. Vascular/Lymphatic: Mild-to-moderate calcified plaque over the abdominal aorta which is normal in caliber. Again noted is mild periaortic  adenopathy as some of these nodes are unchanged and some slightly increased in size while others have decreased in size including near complete resolution of previously seen necrotic 2.1 cm right periaortic lymph node. No significant change in bilateral external iliac chain adenopathy. Worsening bilateral superficial inguinal adenopathy right worse than left. Reproductive: Prostate is normal. Increased heterogeneous soft tissue density over the penile shaft likely patient's worsening of patient's known cancer. Other: Subcutaneous edema over the right upper leg and right pelvis which is new. Musculoskeletal: No new findings. IMPRESSION: 1. Worsening heterogeneous soft tissue density over the penile shaft likely representing progression of patient's known cancer and possibly the cause of patient's hematuria.  Evidence of known metastatic disease with worsening bilateral superficial inguinal adenopathy. Persistent periaortic and iliac chain adenopathy with some nodes larger, some unchanged and some improved/resolved. 2. Two new nodules in the right lower lobe with the larger measuring 1 cm suspicious for metastatic disease. 3. Aortic Atherosclerosis (ICD10-I70.0). Atherosclerotic coronary artery disease. Electronically Signed   By: Marin Olp M.D.   On: 12/28/2019 08:27        Scheduled Meds: . aspirin  81 mg Oral Daily  . enoxaparin (LOVENOX) injection  40 mg Subcutaneous Daily  . lisinopril  10 mg Oral Daily  . metoprolol tartrate  25 mg Oral BID   Continuous Infusions: . cefTRIAXone (ROCEPHIN)  IV 1 g (12/29/19 0642)     LOS: 1 day    Time spent: 34 minutes spent on chart review, discussion with nursing staff, consultants, updating family and interview/physical exam; more than 50% of that time was spent in counseling and/or coordination of care.    Zachory Mangual J [place] DO [facility] Available via Epic secure chat 7am-7pm After these hours, please refer to coverage provider listed on  [email or web address] 12/29/2019, 12:49 PM

## 2019-12-29 NOTE — Progress Notes (Signed)
Nutrition Brief Note  Consult received for assessment of nutrition requirements and status. Chart reviewed. Patient was living in a hotel PTA. He was recently diagnosed with stage 4 penile squamous cell carcinoma and started on palliative XRT; not a surgical candidate.  Patient is currently ordered a Regular diet and, per flow sheet documentation, consumed 35% of dinner last night (217 kcal, 12 grams protein). Breakfast was ordered this AM but no intake of meal documented.   Palliative Care saw patient earlier today and note entered a short time ago. Patient is transitioning to full comfort care and plan is for d/c to residential hospice once a bed is available.    No further nutrition interventions warranted at this time.  Please re-consult as needed.      Jarome Matin, MS, RD, LDN, CNSC Inpatient Clinical Dietitian RD pager # available in [place]  After hours/weekend pager # available in [facility]

## 2019-12-29 NOTE — Progress Notes (Signed)
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  He presented with adenopathy and a penile mass.  He received palliative radiation therapy with him not a surgical candidate.  He is hospitalized with altered mental status and presumed sepsis.  He reports feeling reasonably comfortable although still overall confused without any specific localizing symptoms.  He does report diffuse abdominal and pelvic discomfort.  Objective:  Vital signs in last 24 hours: Temp:  [97.2 F (36.2 C)-98.1 F (36.7 C)] 97.2 F (36.2 C) (05/03 0453) Pulse Rate:  [87-109] 96 (05/03 0949) Resp:  [16-18] 18 (05/03 0453) BP: (132-147)/(64-83) 132/78 (05/03 0949) SpO2:  [99 %-100 %] 100 % (05/03 0453) Weight:  [139 lb 12.4 oz (63.4 kg)] 139 lb 12.4 oz (63.4 kg) (05/03 0447) Weight change:  Last BM Date: 12/27/19  Intake/Output from previous day: 05/02 0701 - 05/03 0700 In: 1935.7 [P.O.:820; I.V.:1000; IV Piggyback:115.7] Out: -  General: Alert, awake without distress. Head: Normocephalic atraumatic. Mouth: mucous membranes moist, pharynx normal without lesions Eyes: No scleral icterus.  Pupils are equal and round reactive to light. Resp: clear to auscultation bilaterally without rhonchi or wheezes or dullness to percussion. Cardio: regular rate and rhythm, S1, S2 normal, no murmur, click, rub or gallop GI: soft, non-tender; bowel sounds normal; no masses,  no organomegaly Musculoskeletal: No joint deformity or effusion. Neurological: No motor, sensory deficits.  Intact deep tendon reflexes. Skin: No rashes or lesions.    Lab Results: Recent Labs    12/28/19 0500 12/29/19 0529  WBC 18.8* 10.8*  HGB 9.3* 8.9*  HCT 30.4* 29.6*  PLT 342 322    BMET Recent Labs    12/28/19 0500 12/29/19 0529  NA 140 141  K 3.8 3.9  CL 113* 111  CO2 23 24  GLUCOSE 130* 102*  BUN 16 10  CREATININE 0.97 0.84  CALCIUM  7.5* 7.8*    Studies/Results: DG Chest 2 View  Result Date: 12/27/2019 CLINICAL DATA:  Altered mental status. EXAM: CHEST - 2 VIEW COMPARISON:  May 10, 2016 FINDINGS: There is no evidence of an acute infiltrate or pneumothorax. Mild, stable blunting of the left costophrenic angle is seen. The heart size and mediastinal contours are within normal limits. There is moderate severity calcification of the aortic arch. The visualized skeletal structures are unremarkable. IMPRESSION: Stable exam without active cardiopulmonary disease. Electronically Signed   By: Virgina Norfolk M.D.   On: 12/27/2019 22:20   CT Head Wo Contrast  Result Date: 12/27/2019 CLINICAL DATA:  Altered mental status. EXAM: CT HEAD WITHOUT CONTRAST TECHNIQUE: Contiguous axial images were obtained from the base of the skull through the vertex without intravenous contrast. COMPARISON:  10/19/2014. FINDINGS: Brain: No evidence of acute infarction, hemorrhage, hydrocephalus, extra-axial collection or mass lesion/mass effect. Advanced white matter disease is noted bilaterally. There are bilateral chronic appearing lacunar infarcts. There are age-indeterminate but chronic appearing infarcts involving the bilateral inferior cerebellum. There is a small age-indeterminate infarct involving the right parietooccipital lobe measuring approximately 1.1 cm (axial series 2, image 16). This is new since 2016. Vascular: No hyperdense vessel or unexpected calcification. Skull: Normal. Negative for fracture or focal lesion. Sinuses/Orbits: No acute finding. Other: None. IMPRESSION: 1. No acute intracranial abnormality. 2. Age indeterminate infarct involving the left inferior cerebellum. 3. Small age-indeterminate infarct involving the right posterior parietooccipital lobe. This is new since 2016. 4. Chronic appearing infarct involving the inferior right  cerebellum, new since 2016. 5. Chronic bilateral basal ganglia lacunar infarcts are again noted.  Electronically Signed   By: Constance Holster M.D.   On: 12/27/2019 22:54   CT RENAL STONE STUDY  Result Date: 12/28/2019 CLINICAL DATA:  Hematuria. Known metastatic penile cancer recently began radiation. Patient unable to give history. EXAM: CT ABDOMEN AND PELVIS WITHOUT CONTRAST TECHNIQUE: Multidetector CT imaging of the abdomen and pelvis was performed following the standard protocol without IV contrast. COMPARISON:  08/27/2019 FINDINGS: Lower chest: Lung bases demonstrate a new 7-8 mm nodule over the posteromedial right lower lobe as well as new 1 cm pleural base nodule over the posterior right lower lobe. Tiny amount of bilateral pleural fluid. Calcification over the coronary arteries. Hepatobiliary: Liver, gallbladder and biliary tree are unremarkable. Pancreas: Normal. Spleen: Normal. Adrenals/Urinary Tract: Adrenal glands are normal. Kidneys are normal in size without nephrolithiasis or focal mass. No hydronephrosis. Ureters and bladder are normal. Stomach/Bowel: Stomach and small bowel are normal. Appendix is normal. Colon is unremarkable. Vascular/Lymphatic: Mild-to-moderate calcified plaque over the abdominal aorta which is normal in caliber. Again noted is mild periaortic adenopathy as some of these nodes are unchanged and some slightly increased in size while others have decreased in size including near complete resolution of previously seen necrotic 2.1 cm right periaortic lymph node. No significant change in bilateral external iliac chain adenopathy. Worsening bilateral superficial inguinal adenopathy right worse than left. Reproductive: Prostate is normal. Increased heterogeneous soft tissue density over the penile shaft likely patient's worsening of patient's known cancer. Other: Subcutaneous edema over the right upper leg and right pelvis which is new. Musculoskeletal: No new findings. IMPRESSION: 1. Worsening heterogeneous soft tissue density over the penile shaft likely representing  progression of patient's known cancer and possibly the cause of patient's hematuria. Evidence of known metastatic disease with worsening bilateral superficial inguinal adenopathy. Persistent periaortic and iliac chain adenopathy with some nodes larger, some unchanged and some improved/resolved. 2. Two new nodules in the right lower lobe with the larger measuring 1 cm suspicious for metastatic disease. 3. Aortic Atherosclerosis (ICD10-I70.0). Atherosclerotic coronary artery disease. Electronically Signed   By: Marin Olp M.D.   On: 12/28/2019 08:27    Medications: I have reviewed the patient's current medications.  Assessment/Plan:  73 year old with:  1.  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  CT scan obtained on Dec 28, 2019 was personally reviewed and showed disease progression.  The natural course of this disease was reviewed again with the patient and his family.  Any treatment at this time would be palliative and the likelihood of clinical benefit from a systemic therapy is marginal.  He is not a candidate for aggressive chemotherapy given his overall health decline.  I am in favor of supportive care only and hospice enrollment potentially residential hospice if he qualifies   2.  Altered mental status: Could be related to infection versus cancer progression at this time.  He appears to be awake alert but still overall confused which could be closer to his baseline   3.  Prognosis: Poor with limited life expectancy.  This was discussed in detail with his daughter Norman Mendez.  All her questions were answered and she is in agreement with the current plan and transitioning to residential hospice.  25  minutes were dedicated to this visit.  More than 50% of the time was spent face-to-face with the patient.  The time was dedicated to reviewing imaging studies, discussing prognosis and plan of care.  LOS: 1 day   Zola Button 12/29/2019, 12:07 PM

## 2019-12-29 NOTE — Progress Notes (Addendum)
Daily Progress Note   Patient Name: Norman Mendez       Date: 12/29/2019 DOB: April 09, 1946  Age: 74 y.o. MRN#: [id number] Attending Physician: [place] Eric J, DO Primary Care Physician: Patriciaann Clan, DO Admit Date: 12/27/2019  Reason for Consultation/Follow-up: Establishing goals of care  Subjective:  complains of pain.   Length of Stay: 1  Current Medications: Scheduled Meds:  . aspirin  81 mg Oral Daily  . enoxaparin (LOVENOX) injection  40 mg Subcutaneous Daily  . lisinopril  10 mg Oral Daily  . metoprolol tartrate  25 mg Oral BID    Continuous Infusions: . cefTRIAXone (ROCEPHIN)  IV 1 g (12/29/19 0642)    PRN Meds: acetaminophen **OR** acetaminophen, hydrALAZINE, HYDROmorphone (DILAUDID) injection, ondansetron **OR** ondansetron (ZOFRAN) IV, oxyCODONE  Physical Exam         Awake more alert Regular S1 S2 Muscle wasting frailty Abdomen not distended No edema  Vital Signs: BP 132/78   Pulse 96   Temp (!) 97.2 F (36.2 C)   Resp 18   Ht 5\' 10"  (1.778 m)   Wt 63.4 kg   SpO2 100%   BMI 20.06 kg/m  SpO2: SpO2: 100 % O2 Device: O2 Device: Room Air O2 Flow Rate:    Intake/output summary:   Intake/Output Summary (Last 24 hours) at 12/29/2019 1229 Last data filed at 12/28/2019 2330 Gross per 24 hour  Intake 1815.7 ml  Output --  Net 1815.7 ml   LBM: Last BM Date: 12/27/19 Baseline Weight: Weight: 63.4 kg Most recent weight: Weight: 63.4 kg       Palliative Assessment/Data:      Patient Active Problem List   Diagnosis Date Noted  . Sepsis ([place]) 12/27/2019  . History of CVA (cerebrovascular accident) 12/27/2019  . CAD (coronary artery disease) 12/27/2019  . Edema 12/09/2019  . Penile cancer ([place]) 11/23/2019  . Homelessness 11/23/2019  . Need for  assistance due to unsteady gait 11/23/2019  . 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 Inguinal lymphadenopathy 11/07/2019  . Penile mass 11/07/2019  . Cognitive impairment 10/21/2014  . Essential hypertension 09/11/2014  . Hyperlipidemia 03/18/2014    Palliative Care Assessment & Plan   Patient Profile:  Mr. 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  He presented with  adenopathy and a penile mass.  He received palliative radiation therapy with him not a surgical candidate.  He is hospitalized with altered mental status and presumed sepsis.  Assessment:  Ct with evidence of cancer progression.  Functional decline Generalized pain Minimal PO intake.   Recommendations/Plan:    Residential hospice for symptom management.   TOC consulted.   Goals of Care and Additional Recommendations:  Limitations on Scope of Treatment: Full Comfort Care  Code Status:    Code Status Orders  (From admission, onward)         Start     Ordered   12/28/19 1251  Do not attempt resuscitation (DNR)  Continuous    Question Answer Comment  In the event of cardiac or respiratory ARREST Do not call a "code blue"   In the event of cardiac or respiratory ARREST Do not perform Intubation, CPR, defibrillation or ACLS   In the event of cardiac or respiratory ARREST Use medication by any route, position, wound care, and other measures to relive pain and suffering. May use oxygen, suction and manual treatment of airway obstruction as needed for comfort.      12/28/19 1250        Code Status History    Date Active Date Inactive Code Status Order ID Comments User Context   12/28/2019 0009 12/28/2019 1250 Full Code [id number]  Lenore Cordia, MD ED   11/07/2019 1441 11/11/2019 2114 Full Code [id number]  Richarda Osmond, DO ED   08/27/2019 2039 08/29/2019 2014 Full Code [id number]  Richarda Osmond, DO ED   10/19/2014 1652 10/22/2014 1828 Full Code  [id number]  Ma Hillock, DO Inpatient   03/18/2014 1659 03/20/2014 1419 Full Code [id number]  Belva Crome, MD Inpatient   03/17/2014 2025 03/18/2014 1659 Full Code [id number]  Lelon Perla, MD Inpatient   11/16/2011 0352 11/18/2011 1619 Full Code [id number]  Melissa Noon, RN Inpatient   Advance Care Planning Activity       Prognosis:   < 2 weeks  Discharge Planning:  Hospice facility  Care plan was discussed with  Patient, med onc MD.   Thank you for allowing the Palliative Medicine Team to assist in the care of this patient.   Time In: 12 Time Out: 12.25 Total Time 25 Prolonged Time Billed  no       Greater than 50%  of this time was spent counseling and coordinating care related to the above assessment and plan.  Loistine Chance, MD  Please contact Palliative Medicine Team phone at [phone] for questions and concerns.

## 2019-12-29 NOTE — Progress Notes (Signed)
Manufacturing engineer [facility]) Hospital Liaison Note  [facility] received referral from [place] today for family/patient interest in [facility]. Chart reviewed and reached out via telephone to patient room but unsuccessful in talking to patient. Spoke to patient daughter who could not talk this afternoon but confirmed interest in a [employer] [facility] bed. Daughter is aware there unfortunately in not a bed available and is agreeable to have a [facility] Liaison follow up with her tomorrow morning to discuss BP bed availability and Hospice services with [facility].  TOC made aware of above.  Please call with any hospice related questions,  Gar Ponto, RN [place] [facility] (on [place]) [phone]

## 2019-12-29 NOTE — Evaluation (Signed)
Physical Therapy Evaluation Patient Details Name: Norman Mendez MRN: [id number] DOB: Feb 25, 1946 Today's Date: 12/29/2019   History of Present Illness  74 y.o. male with medical history significant for stage IV penile squamous cell carcinoma currently on palliative radiation, history of CVA with residual dysarthria, CAD, hypertension, hyperlipidemia, and homelessness and admitted for Acute metabolic encephalopathy to sepsis from presumed UTI  Clinical Impression  Pt admitted with above diagnosis.  Pt currently with functional limitations due to the deficits listed below (see PT Problem List). Pt will benefit from skilled PT to increase their independence and safety with mobility to allow discharge to the venue listed below.   Pt requiring assist for bed mobility and standing.  Pt will likely need assist upon d/c.  Palliative care following and considering hospice.  If not hospice care, pt may need higher level of care, as pt comes from a motel per notes.     Follow Up Recommendations Supervision/Assistance - 24 hour;SNF    Equipment Recommendations  Rolling walker with 5" wheels    Recommendations for Other Services       Precautions / Restrictions Precautions Precautions: Fall Restrictions Weight Bearing Restrictions: No      Mobility  Bed Mobility Overal bed mobility: Needs Assistance Bed Mobility: Supine to Sit;Sit to Supine     Supine to sit: Mod assist Sit to supine: Mod assist   General bed mobility comments: pt requiring assist for lower body, pt self assisting with upper body  Transfers Overall transfer level: Needs assistance Equipment used: Rolling walker (2 wheeled) Transfers: Sit to/from Stand Sit to Stand: Min assist         General transfer comment: multimodal cues for technique, assist to rise, steady and control descent, able to take a couple steps up [facility] however fatigues quickly  Ambulation/Gait             General Gait Details: deferred for  safety (decreased cognition)  Stairs            Wheelchair Mobility    Modified Rankin (Stroke Patients Only)       Balance Overall balance assessment: Needs assistance Sitting-balance support: No upper extremity supported;Feet supported Sitting balance-Leahy Scale: Fair     Standing balance support: Bilateral upper extremity supported Standing balance-Leahy Scale: Poor Standing balance comment: reliant on UE support                             Pertinent Vitals/Pain Pain Assessment: No/denies pain    Home Living Family/patient expects to be discharged to:: Shelter/Homeless                 Additional Comments: per notes, living in a motel?    Prior Function Level of Independence: Needs assistance   Gait / Transfers Assistance Needed: pt reports not walking and then reports he does his own errands, admitted with AMS, may not be at baseline yet           Hand Dominance        Extremity/Trunk Assessment        Lower Extremity Assessment Lower Extremity Assessment: Generalized weakness    Cervical / Trunk Assessment Cervical / Trunk Assessment: Normal  Communication   Communication: Expressive difficulties  Cognition Arousal/Alertness: Awake/alert Behavior During Therapy: Flat affect Overall Cognitive Status: No family/caregiver present to determine baseline cognitive functioning  General Comments: pt appears confused at times, not answering orientation questions other then his name; will follow simple commands with time      General Comments      Exercises     Assessment/Plan    PT Assessment Patient needs continued PT services  PT Problem List Decreased balance;Decreased activity tolerance;Decreased strength;Decreased mobility;Decreased cognition;Decreased knowledge of use of DME       PT Treatment Interventions DME instruction;Therapeutic activities;Gait training;Therapeutic  exercise;Functional mobility training;Patient/family education;Balance training    PT Goals (Current goals can be found in the Care Plan section)  Acute Rehab PT Goals PT Goal Formulation: Patient unable to participate in goal setting Time For Goal Achievement: 01/12/20 Potential to Achieve Goals: Good    Frequency Min 2X/week   Barriers to discharge        Co-evaluation               AM-PAC PT "6 Clicks" Mobility  Outcome Measure Help needed turning from your back to your side while in a flat bed without using bedrails?: A Lot Help needed moving from lying on your back to sitting on the side of a flat bed without using bedrails?: A Lot Help needed moving to and from a bed to a chair (including a wheelchair)?: A Little Help needed standing up from a chair using your arms (e.g., wheelchair or bedside chair)?: A Little Help needed to walk in hospital room?: A Lot Help needed climbing 3-5 steps with a railing? : A Lot 6 Click Score: 14    End of Session Equipment Utilized During Treatment: Gait belt Activity Tolerance: Patient limited by fatigue Patient left: in bed;with call bell/phone within reach;with bed alarm set   PT Visit Diagnosis: Difficulty in walking, not elsewhere classified (R26.2);Unsteadiness on feet (R26.81)    Time: [id number] PT Time Calculation (min) (ACUTE ONLY): 17 min   Charges:   PT Evaluation $PT Eval Low Complexity: 1 Low        Kati PT, DPT Acute Rehabilitation Services Office: [phone]   Trena Platt 12/29/2019, 12:43 PM

## 2019-12-29 NOTE — Evaluation (Signed)
Occupational Therapy Evaluation Patient Details Name: Norman Mendez MRN: [id number] DOB: 04/09/1946 Today's Date: 12/29/2019    History of Present Illness 74 y.o. 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 Pt lives in a motel and reports that he was independent with ADLs/selfcare and used a Marketing executive for mobility. Pt demos impaired processing, problem solving and safety awareness. Pt requires min guard A with grooming and UB ADLs, min A with LB ADLs and toileting, min - min guard A with mobility using RW. Pt would benefit from acute OT services to address impairments to maximize level of function and safety    Follow Up Recommendations  SNF;Supervision/Assistance - 24 hour(being followed by palliative, may transition to hospice?)    Equipment Recommendations  Other (comment)(TBD)    Recommendations for Other Services       Precautions / Restrictions Precautions Precautions: Fall Restrictions Weight Bearing Restrictions: No      Mobility Bed Mobility Overal bed mobility: Needs Assistance Bed Mobility: Supine to Sit;Sit to Supine     Supine to sit: Min assist Sit to supine: Mod assist   General bed mobility comments: pt requiring assist for lower body, pt self assisting with upper body  Transfers Overall transfer level: Needs assistance Equipment used: Rolling walker (2 wheeled) Transfers: Sit to/from Stand Sit to Stand: Min assist         General transfer comment: multimodal cues for technique, assist to rise, steady and control descent, able to take a couple steps up  [facility] however fatigues quickly    Balance Overall balance assessment: Needs assistance Sitting-balance support: No upper extremity supported;Feet supported Sitting balance-Leahy Scale: Fair     Standing balance support: Bilateral upper extremity supported;During functional activity Standing balance-Leahy Scale: Poor Standing balance comment: stood at EOB with min A for balance/support to use urinal                           ADL either performed or assessed with clinical judgement   ADL Overall ADL's : Needs assistance/impaired Eating/Feeding: Set up;Independent;Sitting   Grooming: Wash/dry hands;Wash/dry face; min guard A;Supervision/safety;Sitting   Upper Body Bathing: min guard A;Supervision/ safety Upper Body Bathing Details (indicate cue type and reason): simulated Lower Body Bathing: Minimal assistance   Upper Body Dressing : Set up;Supervision/safety;Sitting   Lower Body Dressing: Minimal assistance                       Vision Patient Visual Report: No change from baseline       Perception     Praxis      Pertinent Vitals/Pain Pain Assessment: Faces Faces Pain Scale: Hurts little more Pain Location: burning at penis after urination Pain Descriptors / Indicators: Burning Pain Intervention(s): Monitored during session;Repositioned;Relaxation     Hand Dominance Right   Extremity/Trunk Assessment Upper Extremity Assessment Upper Extremity Assessment: Generalized weakness   Lower Extremity Assessment Lower Extremity Assessment: Defer to PT evaluation   Cervical / Trunk Assessment Cervical / Trunk Assessment: Normal   Communication Communication Communication: Expressive difficulties   Cognition Arousal/Alertness: Awake/alert Behavior During Therapy: Flat affect Overall Cognitive Status: No family/caregiver  present to determine baseline cognitive functioning Area of Impairment: Attention;Safety/judgement;Awareness;Problem solving                          Safety/Judgement: Decreased awareness of safety   Problem Solving: Slow processing;Decreased initiation;Requires verbal cues General Comments: increased time to answer questions   General Comments       Exercises     Shoulder Instructions      Home Living Family/patient expects to be discharged to:: Shelter/Homeless                                 Additional Comments: per notes, living in a motel?      Prior Functioning/Environment Level of Independence: Needs assistance  Gait / Transfers Assistance Needed: pt reports not walking and then reports he does his own errands, admitted with AMS, may not be at baseline yet              OT Problem List: Decreased strength;Impaired balance (sitting and/or standing);Decreased cognition;Pain;Decreased activity tolerance;Decreased coordination;Decreased knowledge of use of DME or AE      OT Treatment/Interventions: Self-care/ADL training;Therapeutic exercise;Patient/family education;DME and/or AE instruction;Therapeutic activities    OT Goals(Current goals can be found in the care plan section) Acute Rehab OT Goals Patient Stated Goal: go home OT Goal Formulation: With patient Time For Goal Achievement: 01/12/20 Potential to Achieve Goals: Fair ADL Goals Pt Will Perform Grooming: with supervision;with set-up Pt Will Perform Upper Body Bathing: with supervision;with set-up Pt Will Perform Lower Body Bathing: with min guard assist;sit to/from stand Pt Will Perform Upper Body Dressing: with supervision;with set-up Pt Will Perform Lower Body Dressing: with min guard assist Pt Will Transfer to Toilet: with min guard assist;ambulating Pt Will Perform Toileting - Clothing Manipulation and hygiene: with min guard assist;sit to/from stand  OT Frequency: Min 2X/week   Barriers to D/C: Decreased caregiver support          Co-evaluation              AM-PAC OT "6 Clicks" Daily  Activity     Outcome Measure Help from another person eating meals?: None Help from another person taking care of personal grooming?: A Little Help from another person toileting, which includes using toliet, bedpan, or urinal?: A Little Help from another person bathing (including washing, rinsing, drying)?: A Little Help from another person to put on and taking off regular upper body clothing?: A Little Help from another person to put on and taking off regular lower body clothing?: A Little 6 Click Score: 19   End of Session Equipment Utilized During Treatment: Gait belt;Rolling walker  Activity Tolerance: Patient tolerated treatment well Patient left: in chair;with call bell/phone within reach;with chair alarm set  OT Visit Diagnosis: Unsteadiness on feet (R26.81);Other abnormalities of gait and mobility (R26.89);Muscle weakness (generalized) (M62.81);Other symptoms and signs involving cognitive function;Pain Pain - Right/Left: (penis) Pain - part of body: (penis)                Time: [id number] OT Time Calculation (min): 31 min Charges:  OT General Charges $OT Visit: 1 Visit OT Evaluation $OT Eval Low Complexity: 1 Low OT Treatments $Self Care/Home Management : 8-22 mins    Britt Bottom 12/29/2019, 2:52 PM

## 2019-12-29 NOTE — TOC Initial Note (Signed)
Transition of Care [facility]) - Initial/Assessment Note    Patient Details  Name: Norman Mendez MRN: [id number] Date of Birth: 08/02/1946  Transition of Care [facility]) CM/SW Contact:    Lynnell Catalan, RN Phone Number: 12/29/2019, 3:54 PM  Clinical Narrative:                 [facility] consult for residential hospice. This CM attempted to talk to pt at bedside. Pt did not seem to want to speak to me at this time. Pt defers conversation to daughter Norman Mendez. This CM spoke with daughter Norman Mendez for choice for residential hospice. [facility] was chosen. [employer] liaison contacted for referral. No beds at [facility] available today. TOC will continue to follow.  Expected Discharge Plan: [place] Barriers to Discharge: No Barriers Identified   Expected Discharge Plan and Services Expected Discharge Plan: [place]         Activities of Daily Living Home Assistive Devices/Equipment: Gilford Rile (specify type) ADL Screening (condition at time of admission) Patient's cognitive ability adequate to safely complete daily activities?: Yes Is the patient deaf or have difficulty hearing?: No Does the patient have difficulty seeing, even when wearing glasses/contacts?: No Does the patient have difficulty concentrating, remembering, or making decisions?: No Patient able to express need for assistance with ADLs?: Yes Does the patient have difficulty dressing or bathing?: No Independently performs ADLs?: Yes (appropriate for developmental age) Does the patient have difficulty walking or climbing stairs?: Yes Weakness of Legs: Both Weakness of Arms/Hands: Both Emotional Assessment   Attitude/Demeanor/Rapport: Guarded Affect (typically observed): Withdrawn        Admission diagnosis:  Sepsis ([place]) [A41.9] Altered mental status, unspecified altered mental status type [R41.82] Sepsis, due to unspecified organism, unspecified whether acute organ dysfunction present [facility])  [A41.9] Patient Active Problem List   Diagnosis Date Noted  . Palliative care by specialist   . Goals of care, counseling/discussion   . General weakness   . Sepsis ([place]) 12/27/2019  . History of CVA (cerebrovascular accident) 12/27/2019  . CAD (coronary artery disease) 12/27/2019  . Edema 12/09/2019  . Penile cancer ([place]) 11/23/2019  . Homelessness 11/23/2019  . Need for assistance due to unsteady gait 11/23/2019  . 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 Inguinal lymphadenopathy 11/07/2019  . Penile mass 11/07/2019  . Cognitive impairment 10/21/2014  . Essential hypertension 09/11/2014  . Hyperlipidemia 03/18/2014   PCP:  Patriciaann Clan, DO Pharmacy:   [facility] [place], [place] - [address] [place] [address] Phone: [phone] Fax: [phone]     Social Determinants of Health (SDOH) Interventions    Readmission Risk Interventions Readmission Risk Prevention Plan 12/29/2019 11/11/2019  Post Dischage Appt - Complete  Medication Screening - Complete  Transportation Screening Complete Complete  PCP or Specialist Appt within 3-5 Days Complete -  HRI or Home Care Consult Complete -  Social Work Consult for Recovery Care Planning/Counseling Complete -  Palliative Care Screening Complete -  Medication Review Press photographer) Complete -  Some recent data might be hidden

## 2019-12-30 ENCOUNTER — Ambulatory Visit: Payer: Medicare Other

## 2019-12-30 LAB — CBC
HCT: 28.6 % — ABNORMAL LOW (ref 39.0–52.0)
Hemoglobin: 8.7 g/dL — ABNORMAL LOW (ref 13.0–17.0)
MCH: 28.2 pg (ref 26.0–34.0)
MCHC: 30.4 g/dL (ref 30.0–36.0)
MCV: 92.9 fL (ref 80.0–100.0)
Platelets: 310 10*3/uL (ref 150–400)
RBC: 3.08 MIL/uL — ABNORMAL LOW (ref 4.22–5.81)
RDW: 14.8 % (ref 11.5–15.5)
WBC: 8.8 10*3/uL (ref 4.0–10.5)
nRBC: 0 % (ref 0.0–0.2)

## 2019-12-30 LAB — BASIC METABOLIC PANEL
Anion gap: 7 (ref 5–15)
BUN: 9 mg/dL (ref 8–23)
CO2: 25 mmol/L (ref 22–32)
Calcium: 7.8 mg/dL — ABNORMAL LOW (ref 8.9–10.3)
Chloride: 109 mmol/L (ref 98–111)
Creatinine, Ser: 0.81 mg/dL (ref 0.61–1.24)
GFR calc Af Amer: >60 mL/min (ref >60)
GFR calc non Af Amer: >60 mL/min (ref >60)
Glucose, Bld: 103 mg/dL — ABNORMAL HIGH (ref 70–99)
Potassium: 3.4 mmol/L — ABNORMAL LOW (ref 3.5–5.1)
Sodium: 141 mmol/L (ref 135–145)

## 2019-12-30 LAB — URINE CULTURE: Culture: 1000 — AB

## 2019-12-30 LAB — MAGNESIUM: Magnesium: 2.1 mg/dL (ref 1.7–2.4)

## 2019-12-30 MED ORDER — POLYETHYLENE GLYCOL 3350 17 G PO PACK
17.0000 g | PACK | Freq: Every day | ORAL | 0 refills | Status: AC | PRN
Start: 2019-12-30 — End: ?

## 2019-12-30 MED ORDER — DOCUSATE SODIUM 100 MG PO CAPS: 100.0000 mg | ORAL_CAPSULE | Freq: Two times a day (BID) | ORAL | 0 refills | Status: AC

## 2019-12-30 MED ORDER — OXYCODONE HCL 5 MG PO TABS: 5.0000 mg | ORAL_TABLET | ORAL | 0 refills | Status: AC | PRN

## 2019-12-30 MED ORDER — LORAZEPAM 1 MG PO TABS: 1.0000 mg | ORAL_TABLET | Freq: Four times a day (QID) | ORAL | 0 refills | Status: AC | PRN

## 2019-12-30 MED ORDER — POTASSIUM CHLORIDE CRYS ER 20 MEQ PO TBCR
30.0000 meq | EXTENDED_RELEASE_TABLET | ORAL | Status: AC
  Administered 2019-12-30: 30 meq via ORAL
  Filled 2019-12-30: qty 1

## 2019-12-30 MED ORDER — ONDANSETRON HCL 4 MG PO TABS: 4.0000 mg | ORAL_TABLET | Freq: Four times a day (QID) | ORAL | 0 refills | Status: AC | PRN

## 2019-12-30 NOTE — Progress Notes (Signed)
Report called to [facility] place and all questions answered.

## 2019-12-30 NOTE — TOC Transition Note (Signed)
Transition of Care [facility]) - CM/SW Discharge Note   Patient Details  Name: Norman Mendez MRN: [id number] Date of Birth: 07/27/1946  Transition of Care [facility]) CM/SW Contact:  Lynnell Catalan, RN Phone Number: 12/30/2019, 3:54 PM   Clinical Narrative:     Pt to dc to [facility] today. RN has called report to [employer] [facility]. Yellow DNR to be sent with pt at dc. [facility] contacted for transport.    Barriers to Discharge: No Barriers Identified   Patient Goals and CMS Choice        Readmission Risk Interventions Readmission Risk Prevention Plan 12/29/2019 11/11/2019  Post Dischage Appt - Complete  Medication Screening - Complete  Transportation Screening Complete Complete  PCP or Specialist Appt within 3-5 Days Complete -  HRI or Home Care Consult Complete -  Social Work Consult for Recovery Care Planning/Counseling Complete -  Palliative Care Screening Complete -  Medication Review Press photographer) Complete -  Some recent data might be hidden

## 2019-12-30 NOTE — Progress Notes (Signed)
Patient being transferred to [facility] place today. Per Dr. Johny Shears order this patient's radiation chart should be closed out and further treatments cancelled. Informed treatment machine of this finding.

## 2019-12-30 NOTE — Progress Notes (Signed)
Manufacturing engineer [facility])  [facility] has a bed for Mr. Belenda Cruise today.  Consents to transfer him cannot be completed until 3 pm, per his daughter [name].  Once they are completed, [facility] will update [facility] manager so transportation can be arranged.  RN staff, please call report to [phone] at any time.  Room is assigned at this time.  Please fax d/c summary to [phone]  Please call with any questions or concerns  Venia Carbon RN, BSN, [facility] Liaison (in [place] or [place] chat)

## 2019-12-30 NOTE — Discharge Summary (Signed)
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  Patient has known metastatic penile cancer and recently started on palliative radiation. He was not considered a surgical candidate. He has been having significant uncontrolled pain and was started on MS Contin to try to help alleviate it. He is currently staying in a hotel room. His daughter has been helping give his medications and when she went over there this morning she gave him 1 tablet of MS Contin and left 2 tablets for later. When she returned to see him he did not answer when she knocked on the door. She saw him slumped over on a table. She was able to get inside and noted that he was altered and not speaking (she says he was speaking in the morning). She says the 2 tablets of MS Contin were still there so she did not think he took extra pain medications. She subsequently called 911 for further evaluation.  In the ED, BP 156/76, pulse 122, RR 24, temp 99.8 Fahrenheit rectally, SPO2 100% on room air. WBC 20.3, hemoglobin 9.3, platelets 363,000, sodium 139, potassium 3.6, bicarb 21, BUN 21, creatinine 1.26, AST 21, ALT 18, alk phos 70, total bilirubin 1.1, serum glucose 197, lactic  acid 2.3, ammonia 25, lipase 16. Chest x-ray negative for acute infiltrate.  CT head without contrast negative for acute intracranial abnormality but does show age-indeterminate infarct involving left inferior cerebellum, small age-indeterminate infarct involving right posterior parieto-occipital lobe, chronic appearing infarct involving right inferior cerebellum, and chronic bilateral basal ganglia lacunar infarcts. Patient was given 1 L normal saline and empiric IV vancomycin and cefepime. The hospitalist service was consulted to admit for further evaluation and management.  Hospital course:  Sepsis, present on admission Acute metabolic encephalopathy to sepsis from presumed UTI Patient presenting with tachycardia, tachypnea, leukocytosis, and elevated lactic acid. Suspect presentation due to urinary infection.  Also consideration from side effect from pain medications.  Urinalysis with large leukoesterase, negative nitrite, greater than 50 WBCs, greater than 50 RBCs, rare bacteria.  Ammonia level 25, within normal limits.  Covid-19 -.  Lactic acid 2.3 improved to 1.7.  Patient was started on IV antibiotics with ceftriaxone.  Urine culture grew back Proteus Mirabella's.  Completed 3-day course of IV antibiotics while inpatient.  White blood cell count improved to 8.8 at time of discharge.  Now transitioning to hospice.  Stage IV penile squamous cell carcinoma: Follows with oncology, Norman Mendez, and radiation oncology, Norman Mendez. Recently started on palliative radiation.  CT renal stone study on 12/28/2019 notable for worsening heterogeneous soft tissue density over the penile shaft likely representing progression of his known cancer with worsening bilateral superficial inguinal adenopathy with persistent periaortic and iliac chain adenopathy with some nodes larger, 2 new nodules right lower lobe largest measuring 1.0 cm suspicious for metastatic disease.  Patient was seen by his primary oncologist,  Norman Mendez recommended supportive care and  hospice enrollment with likely residential hospice given he is not a candidate for aggressive chemotherapy given his overall health decline in functional status. Patient with ultimately extremely grim prognosis with notable disease progression on imaging study .  Palliative care was also consulted and followed during hospital course.  Will be discharging to [facility] today.  Continue pain control with oxycodone IR 5 mg p.o. every 4 hours as needed for moderate pain.  Ativan 1 mg every 6 hours as needed for anxiety.  History of CVA/hyperlipidemia: With reported baseline dysarthria. CT head is without acute intracranial abnormality but does show age-indeterminate infarct involving left inferior cerebellum, right posterior parieto-occipital lobe, and chronic appearing inferior right cerebellum and bilateral basal ganglia lacunar infarcts. Previously on atorvastatin, no longer taking per med reconciliation (recent outpatient notes state he was taking 2-3 tablets of atorvastatin 80 mg erroneously).  Discontinued home atorvastatin and aspirin is now transitioning to residential hospice.  CAD: Discontinue home aspirin now transitioning to residential hospice.  Anemia of chronic disease, malignancy Hemoglobin 9.3 on admission without obvious bleeding. Continue to monitor.  Hypertension: Discontinue home Lopressor/lisinopril as transitioning to hospice.  Generalized weakness: Daughter states that patient appears too weak to care for self or ambulate.     Discharging to [facility] today.  Discharge Diagnoses:  Active Problems:   Hyperlipidemia   Essential hypertension   Squamous cell carcinoma of penis (HCC)   History of CVA (cerebrovascular accident)   CAD (coronary artery disease)   Palliative care by specialist   Goals of care, counseling/discussion   General weakness    Discharge  Instructions  Discharge Instructions    Diet - low sodium heart healthy   Complete by: As directed    Increase activity slowly   Complete by: As directed      Allergies as of 12/30/2019   No Known Allergies     Medication List    STOP taking these medications   aspirin 81 MG chewable tablet   lisinopril 10 MG tablet Commonly known as: ZESTRIL   metoprolol tartrate 25 MG tablet Commonly known as: LOPRESSOR   morphine 15 MG 12 hr tablet Commonly known as: MS CONTIN   nystatin cream Commonly known as: MYCOSTATIN   oxyCODONE-acetaminophen 7.5-325 MG tablet Commonly known as: PERCOCET     TAKE these medications   docusate sodium 100 MG capsule Commonly known as: COLACE Take 1 capsule (100 mg total) by mouth 2 (two) times daily. What changed: when to take this   LORazepam 1 MG tablet Commonly known as: Ativan Take 1 tablet (1 mg total) by mouth every 6 (six) hours as needed for anxiety.   ondansetron 4 MG tablet Commonly known as: ZOFRAN Take 1 tablet (4 mg total) by mouth every 6 (six) hours as needed for nausea.   oxyCODONE 5 MG immediate release tablet Commonly known as: Oxy IR/ROXICODONE Take 1 tablet (5 mg total) by mouth every 4 (four) hours as needed for moderate pain.   polyethylene glycol 17 g packet Commonly known as: MIRALAX / GLYCOLAX Take 17 g by mouth daily as needed for mild constipation. What changed:   when to take this  reasons to take this       No Known Allergies  Consultations:  Palliative care  Medical oncology   Procedures/Studies: DG Chest 2 View  Result Date: 12/27/2019 CLINICAL DATA:  Altered mental status. EXAM: CHEST - 2 VIEW COMPARISON:  May 10, 2016 FINDINGS: There is no evidence  of an acute infiltrate or pneumothorax. Mild, stable blunting of the left costophrenic angle is seen. The heart size and mediastinal contours are within normal limits. There is moderate severity calcification of the aortic arch. The  visualized skeletal structures are unremarkable. IMPRESSION: Stable exam without active cardiopulmonary disease. Electronically Signed   By: Virgina Norfolk M.D.   On: 12/27/2019 22:20   CT Head Wo Contrast  Result Date: 12/27/2019 CLINICAL DATA:  Altered mental status. EXAM: CT HEAD WITHOUT CONTRAST TECHNIQUE: Contiguous axial images were obtained from the base of the skull through the vertex without intravenous contrast. COMPARISON:  10/19/2014. FINDINGS: Brain: No evidence of acute infarction, hemorrhage, hydrocephalus, extra-axial collection or mass lesion/mass effect. Advanced white matter disease is noted bilaterally. There are bilateral chronic appearing lacunar infarcts. There are age-indeterminate but chronic appearing infarcts involving the bilateral inferior cerebellum. There is a small age-indeterminate infarct involving the right parietooccipital lobe measuring approximately 1.1 cm (axial series 2, image 16). This is new since 2016. Vascular: No hyperdense vessel or unexpected calcification. Skull: Normal. Negative for fracture or focal lesion. Sinuses/Orbits: No acute finding. Other: None. IMPRESSION: 1. No acute intracranial abnormality. 2. Age indeterminate infarct involving the left inferior cerebellum. 3. Small age-indeterminate infarct involving the right posterior parietooccipital lobe. This is new since 2016. 4. Chronic appearing infarct involving the inferior right cerebellum, new since 2016. 5. Chronic bilateral basal ganglia lacunar infarcts are again noted. Electronically Signed   By: Constance Holster M.D.   On: 12/27/2019 22:54   CT RENAL STONE STUDY  Result Date: 12/28/2019 CLINICAL DATA:  Hematuria. Known metastatic penile cancer recently began radiation. Patient unable to give history. EXAM: CT ABDOMEN AND PELVIS WITHOUT CONTRAST TECHNIQUE: Multidetector CT imaging of the abdomen and pelvis was performed following the standard protocol without IV contrast. COMPARISON:   08/27/2019 FINDINGS: Lower chest: Lung bases demonstrate a new 7-8 mm nodule over the posteromedial right lower lobe as well as new 1 cm pleural base nodule over the posterior right lower lobe. Tiny amount of bilateral pleural fluid. Calcification over the coronary arteries. Hepatobiliary: Liver, gallbladder and biliary tree are unremarkable. Pancreas: Normal. Spleen: Normal. Adrenals/Urinary Tract: Adrenal glands are normal. Kidneys are normal in size without nephrolithiasis or focal mass. No hydronephrosis. Ureters and bladder are normal. Stomach/Bowel: Stomach and small bowel are normal. Appendix is normal. Colon is unremarkable. Vascular/Lymphatic: Mild-to-moderate calcified plaque over the abdominal aorta which is normal in caliber. Again noted is mild periaortic adenopathy as some of these nodes are unchanged and some slightly increased in size while others have decreased in size including near complete resolution of previously seen necrotic 2.1 cm right periaortic lymph node. No significant change in bilateral external iliac chain adenopathy. Worsening bilateral superficial inguinal adenopathy right worse than left. Reproductive: Prostate is normal. Increased heterogeneous soft tissue density over the penile shaft likely patient's worsening of patient's known cancer. Other: Subcutaneous edema over the right upper leg and right pelvis which is new. Musculoskeletal: No new findings. IMPRESSION: 1. Worsening heterogeneous soft tissue density over the penile shaft likely representing progression of patient's known cancer and possibly the cause of patient's hematuria. Evidence of known metastatic disease with worsening bilateral superficial inguinal adenopathy. Persistent periaortic and iliac chain adenopathy with some nodes larger, some unchanged and some improved/resolved. 2. Two new nodules in the right lower lobe with the larger measuring 1 cm suspicious for metastatic disease. 3. Aortic Atherosclerosis  (ICD10-I70.0). Atherosclerotic coronary artery disease. Electronically Signed   By: Marin Olp M.D.  On: 12/28/2019 08:27      Subjective:   Discharge Exam: Vitals:   12/29/19 2029 12/30/19 0851  BP: (!) 151/94 (!) 157/84  Pulse: (!) 101 100  Resp: 19   Temp: 97.9 F (36.6 C)   SpO2: 100%    Vitals:   12/29/19 0949 12/29/19 1333 12/29/19 2029 12/30/19 0851  BP: 132/78 (!) 144/75 (!) 151/94 (!) 157/84  Pulse: 96 94 (!) 101 100  Resp:  17 19   Temp:  98.6 F (37 C) 97.9 F (36.6 C)   TempSrc:  Oral Oral   SpO2:  100% 100%   Weight:      Height:        General: Pt is alert, awake, not in acute distress, weak, severely cachectic in appearance Cardiovascular: RRR, S1/S2 +, no rubs, no gallops Respiratory: CTA bilaterally, no wheezing, no rhonchi oxygenating well on room air Abdominal: Soft, NT, ND, bowel sounds + Extremities: no edema, no cyanosis    The results of significant diagnostics from this hospitalization (including imaging, microbiology, ancillary and laboratory) are listed below for reference.     Microbiology: Recent Results (from the past 240 hour(s))  Culture, blood (Routine x 2)     Status: None (Preliminary result)   Collection Time: 12/27/19  9:10 PM   Specimen: BLOOD  Result Value Ref Range Status   Specimen Description   Final    BLOOD LEFT WRIST Performed at [facility] [address] [place], [place] [address]    Special Requests   Final    BOTTLES DRAWN AEROBIC AND ANAEROBIC Blood Culture adequate volume Performed at [facility] [address] [place], [place] [address]    Culture   Final    NO GROWTH 2 DAYS Performed at [facility] [address], [place], [place] [address]    Report Status PENDING  Incomplete  Culture, blood (Routine x 2)     Status: None (Preliminary result)   Collection Time: 12/27/19  9:11 PM   Specimen: BLOOD  Result Value Ref Range Status   Specimen  Description   Final    BLOOD RIGHT ANTECUBITAL Performed at [facility] [address] [place], [place] [address]    Special Requests   Final    BOTTLES DRAWN AEROBIC AND ANAEROBIC Blood Culture adequate volume Performed at [facility] [address] [place], [place] [address]    Culture   Final    NO GROWTH 2 DAYS Performed at [facility] [address], [place], [place] [address]    Report Status PENDING  Incomplete  Urine culture     Status: Abnormal (Preliminary result)   Collection Time: 12/27/19  9:11 PM   Specimen: In/Out Cath Urine  Result Value Ref Range Status   Specimen Description   Final    IN/OUT CATH URINE Performed at [facility] [address], [place], [place] [address]    Special Requests   Final    NONE Performed at [facility], [place] [address] [place], [place] [address]    Culture (A)  Final    1,000 COLONIES/mL PROTEUS MIRABILIS SUSCEPTIBILITIES TO FOLLOW Performed at [facility], [place] [address], [place],  [address]    Report Status PENDING  Incomplete  Respiratory Panel by RT PCR (Flu A&B, Covid) - Nasopharyngeal Swab     Status: None   Collection Time: 12/27/19 10:34 PM  Specimen: Nasopharyngeal Swab  Result Value Ref Range Status   SARS Coronavirus 2 by RT PCR NEGATIVE NEGATIVE Final    Comment: (NOTE) SARS-CoV-2 target nucleic acids are NOT DETECTED. The SARS-CoV-2 RNA is generally detectable in upper respiratoy specimens during the acute phase of infection. The lowest concentration of SARS-CoV-2 viral copies this assay can detect is 131 copies/mL. A negative result does not preclude SARS-Cov-2 infection and should not be used as the sole basis for treatment or other patient management decisions. A negative result may occur with  improper specimen collection/handling, submission of specimen other than nasopharyngeal swab, presence of viral  mutation(s) within the areas targeted by this assay, and inadequate number of viral copies (<131 copies/mL). A negative result must be combined with clinical observations, patient history, and epidemiological information. The expected result is Negative. Fact Sheet for Patients:  PinkCheek.be Fact Sheet for Healthcare Providers:  GravelBags.it This test is not yet ap proved or cleared by the [place] FDA and  has been authorized for detection and/or diagnosis of SARS-CoV-2 by FDA under an Emergency Use Authorization (EUA). This EUA will remain  in effect (meaning this test can be used) for the duration of the COVID-19 declaration under Section 564(b)(1) of the Act, 21 U.S.C. section 360bbb-3(b)(1), unless the authorization is terminated or revoked sooner.    Influenza A by PCR NEGATIVE NEGATIVE Final   Influenza B by PCR NEGATIVE NEGATIVE Final    Comment: (NOTE) The Xpert Xpress SARS-CoV-2/FLU/RSV assay is intended as an aid in  the diagnosis of influenza from Nasopharyngeal swab specimens and  should not be used as a sole basis for treatment. Nasal washings and  aspirates are unacceptable for Xpert Xpress SARS-CoV-2/FLU/RSV  testing. Fact Sheet for Patients: PinkCheek.be Fact Sheet for Healthcare Providers: GravelBags.it This test is not yet approved or cleared by the [place] FDA and  has been authorized for detection and/or diagnosis of SARS-CoV-2 by  FDA under an Emergency Use Authorization (EUA). This EUA will remain  in effect (meaning this test can be used) for the duration of the  Covid-19 declaration under Section 564(b)(1) of the Act, 21  U.S.C. section 360bbb-3(b)(1), unless the authorization is  terminated or revoked. Performed at [facility], [place] [address] [place], [place] [address]      Labs: BNP (last 3 results) No  results for input(s): BNP in the last 8760 hours. Basic Metabolic Panel: Recent Labs  Lab 12/27/19 2108 12/28/19 0500 12/29/19 0529 12/30/19 0532  NA 139 140 141 141  K 3.6 3.8 3.9 3.4*  CL 108 113* 111 109  CO2 21* 23 24 25   GLUCOSE 197* 130* 102* 103*  BUN 21 16 10 9   CREATININE 1.26* 0.97 0.84 0.81  CALCIUM 8.1* 7.5* 7.8* 7.8*  MG  --   --  2.1 2.1   Liver Function Tests: Recent Labs  Lab 12/27/19 2108  AST 21  ALT 18  ALKPHOS 70  BILITOT 1.1  PROT 7.1  ALBUMIN 2.7*   Recent Labs  Lab 12/27/19 2112  LIPASE 16   Recent Labs  Lab 12/27/19 2114  AMMONIA 25   CBC: Recent Labs  Lab 12/27/19 2108 12/28/19 0500 12/29/19 0529 12/30/19 0532  WBC 20.3* 18.8* 10.8* 8.8  NEUTROABS 18.9*  --   --   --   HGB 9.3* 9.3* 8.9* 8.7*  HCT 31.1* 30.4* 29.6* 28.6*  MCV 93.4 91.8 92.5 92.9  PLT 363 342 322 310   Cardiac Enzymes: No results  for input(s): CKTOTAL, CKMB, CKMBINDEX, TROPONINI in the last 168 hours. BNP: Invalid input(s): POCBNP CBG: Recent Labs  Lab 12/27/19 2111  GLUCAP 164*   D-Dimer No results for input(s): DDIMER in the last 72 hours. Hgb A1c No results for input(s): HGBA1C in the last 72 hours. Lipid Profile No results for input(s): CHOL, HDL, LDLCALC, TRIG, CHOLHDL, LDLDIRECT in the last 72 hours. Thyroid function studies No results for input(s): TSH, T4TOTAL, T3FREE, THYROIDAB in the last 72 hours.  Invalid input(s): FREET3 Anemia work up No results for input(s): VITAMINB12, FOLATE, FERRITIN, TIBC, IRON, RETICCTPCT in the last 72 hours. Urinalysis    Component Value Date/Time   COLORURINE YELLOW 12/27/2019 2106   APPEARANCEUR HAZY (A) 12/27/2019 2106   LABSPEC 1.018 12/27/2019 2106   PHURINE 5.0 12/27/2019 2106   GLUCOSEU NEGATIVE 12/27/2019 2106   HGBUR LARGE (A) 12/27/2019 2106   BILIRUBINUR NEGATIVE 12/27/2019 2106   KETONESUR 5 (A) 12/27/2019 2106   PROTEINUR 100 (A) 12/27/2019 2106   UROBILINOGEN 0.2 10/19/2014 1512    NITRITE NEGATIVE 12/27/2019 2106   LEUKOCYTESUR LARGE (A) 12/27/2019 2106   Sepsis Labs Invalid input(s): PROCALCITONIN,  WBC,  LACTICIDVEN Microbiology Recent Results (from the past 240 hour(s))  Culture, blood (Routine x 2)     Status: None (Preliminary result)   Collection Time: 12/27/19  9:10 PM   Specimen: BLOOD  Result Value Ref Range Status   Specimen Description   Final    BLOOD LEFT WRIST Performed at [facility], [place] [address] [place], [place] [address]    Special Requests   Final    BOTTLES DRAWN AEROBIC AND ANAEROBIC Blood Culture adequate volume Performed at [facility] [address], [place], [place] [address]    Culture   Final    NO GROWTH 2 DAYS Performed at [facility] [address], [place], [place] [address]    Report Status PENDING  Incomplete  Culture, blood (Routine x 2)     Status: None (Preliminary result)   Collection Time: 12/27/19  9:11 PM   Specimen: BLOOD  Result Value Ref Range Status   Specimen Description   Final    BLOOD RIGHT ANTECUBITAL Performed at [facility] [address], [place], [place] [address]    Special Requests   Final    BOTTLES DRAWN AEROBIC AND ANAEROBIC Blood Culture adequate volume Performed at [facility] [address], [place], [place] [address]    Culture   Final    NO GROWTH 2 DAYS Performed at [facility] [address] [place], [place] [address]    Report Status PENDING  Incomplete  Urine culture     Status: Abnormal (Preliminary result)   Collection Time: 12/27/19  9:11 PM   Specimen: In/Out Cath Urine  Result Value Ref Range Status   Specimen Description   Final    IN/OUT CATH URINE Performed at [facility] [address], [place], [place] [address]    Special Requests   Final    NONE Performed at [facility], [place] [address], [place], [place] [address]     Culture (A)  Final    1,000 COLONIES/mL PROTEUS MIRABILIS SUSCEPTIBILITIES TO FOLLOW Performed at [facility], [place] [address], [place], [place] [address]    Report Status PENDING  Incomplete  Respiratory Panel by RT PCR (Flu A&B, Covid) - Nasopharyngeal Swab     Status: None  Collection Time: 12/27/19 10:34 PM   Specimen: Nasopharyngeal Swab  Result Value Ref Range Status   SARS Coronavirus 2 by RT PCR NEGATIVE NEGATIVE Final    Comment: (NOTE) SARS-CoV-2 target nucleic acids are NOT DETECTED. The SARS-CoV-2 RNA is generally detectable in upper respiratoy specimens during the acute phase of infection. The lowest concentration of SARS-CoV-2 viral copies this assay can detect is 131 copies/mL. A negative result does not preclude SARS-Cov-2 infection and should not be used as the sole basis for treatment or other patient management decisions. A negative result may occur with  improper specimen collection/handling, submission of specimen other than nasopharyngeal swab, presence of viral mutation(s) within the areas targeted by this assay, and inadequate number of viral copies (<131 copies/mL). A negative result must be combined with clinical observations, patient history, and epidemiological information. The expected result is Negative. Fact Sheet for Patients:  PinkCheek.be Fact Sheet for Healthcare Providers:  GravelBags.it This test is not yet ap proved or cleared by the [place] FDA and  has been authorized for detection and/or diagnosis of SARS-CoV-2 by FDA under an Emergency Use Authorization (EUA). This EUA will remain  in effect (meaning this test can be used) for the duration of the COVID-19 declaration under Section 564(b)(1) of the Act, 21 U.S.C. section 360bbb-3(b)(1), unless the authorization is terminated or revoked sooner.    Influenza A by PCR NEGATIVE NEGATIVE Final   Influenza B by PCR NEGATIVE  NEGATIVE Final    Comment: (NOTE) The Xpert Xpress SARS-CoV-2/FLU/RSV assay is intended as an aid in  the diagnosis of influenza from Nasopharyngeal swab specimens and  should not be used as a sole basis for treatment. Nasal washings and  aspirates are unacceptable for Xpert Xpress SARS-CoV-2/FLU/RSV  testing. Fact Sheet for Patients: PinkCheek.be Fact Sheet for Healthcare Providers: GravelBags.it This test is not yet approved or cleared by the [place] FDA and  has been authorized for detection and/or diagnosis of SARS-CoV-2 by  FDA under an Emergency Use Authorization (EUA). This EUA will remain  in effect (meaning this test can be used) for the duration of the  Covid-19 declaration under Section 564(b)(1) of the Act, 21  U.S.C. section 360bbb-3(b)(1), unless the authorization is  terminated or revoked. Performed at [facility], [place] [address], [place], [place] [address]      Time coordinating discharge: Over 30 minutes  SIGNED:   Donnamarie Poag [place] DO  [facility] 12/30/2019, 10:18 AM

## 2019-12-31 ENCOUNTER — Ambulatory Visit: Payer: Medicare Other

## 2020-01-01 ENCOUNTER — Ambulatory Visit: Payer: Medicare Other

## 2020-01-02 ENCOUNTER — Ambulatory Visit: Payer: Medicare Other

## 2020-01-02 LAB — CULTURE, BLOOD (ROUTINE X 2)
Culture: NO GROWTH
Culture: NO GROWTH
Special Requests: ADEQUATE
Special Requests: ADEQUATE

## 2020-01-05 ENCOUNTER — Ambulatory Visit: Payer: Medicare Other

## 2020-01-06 ENCOUNTER — Ambulatory Visit: Payer: Medicare Other

## 2020-01-07 ENCOUNTER — Ambulatory Visit: Payer: Medicare Other

## 2020-01-08 ENCOUNTER — Ambulatory Visit: Payer: Medicare Other

## 2020-01-09 ENCOUNTER — Inpatient Hospital Stay: Payer: Medicare Other | Admitting: Oncology

## 2020-01-09 ENCOUNTER — Ambulatory Visit: Payer: Medicare Other

## 2020-02-26 DEATH — deceased

## 2020-04-02 NOTE — Progress Notes (Signed)
  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  Beams/energy:   A 3D field set-up was employed with 6 MV X-rays  Narrative: The patient tolerated radiation treatment relatively well.  Due to a rapid decline in the patient's health status requiring hospital admission, the patient elected to discontinue his treatment and transition to hospice care.  Plan: The patient has completed radiation treatment and will transition to hospice care at this point.  We are more than happy to see him back on an as-needed basis.  ________________________________  Sheral Apley Tammi Klippel, M.D.

## 2020-10-19 IMAGING — CT CT RENAL STONE PROTOCOL
2 of 4 series · 15 of 46 positions shown, 17 images · non-contrast
Comparison: 08/27/2019

CLINICAL DATA: Hematuria. Known metastatic penile cancer recently
began radiation. Patient unable to give history.

EXAM:
CT ABDOMEN AND PELVIS WITHOUT CONTRAST
TECHNIQUE: Multidetector CT imaging of the abdomen and pelvis was performed
following the standard protocol without IV contrast.

[Series 2: axial st · axial · 0.79mm/px · z∈[-535,-40]mm · 12 of 111 slices shown, 14 images]
[im 6/111  soft-tissue]
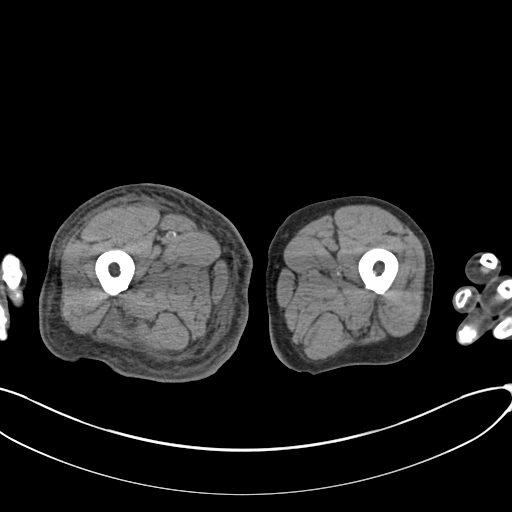
[im 6/111  bone]
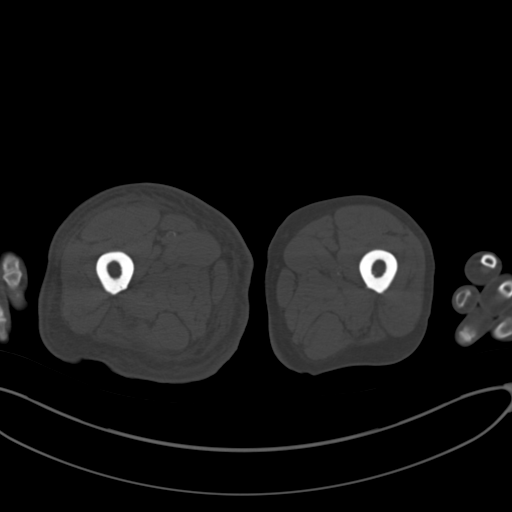
[im 18/111  soft-tissue]
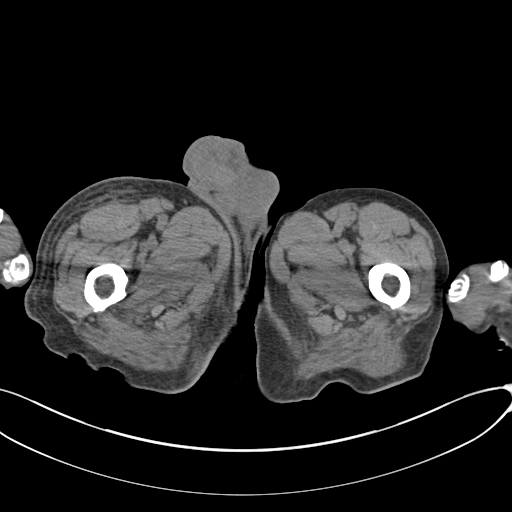
[im 24/111  soft-tissue]
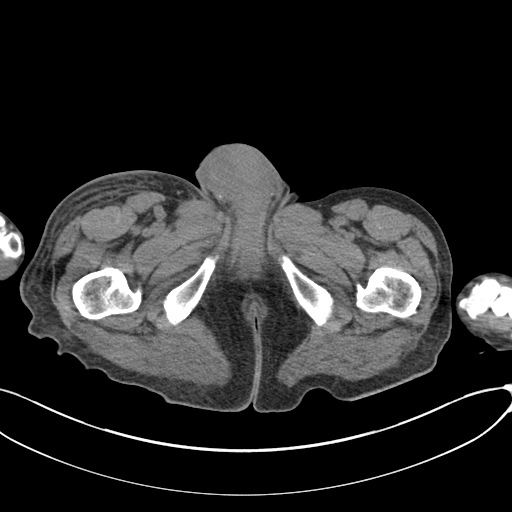
[im 35/111  soft-tissue]
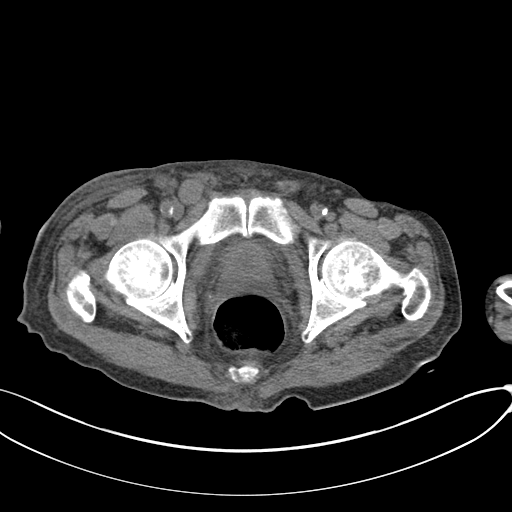
[im 41/111  soft-tissue]
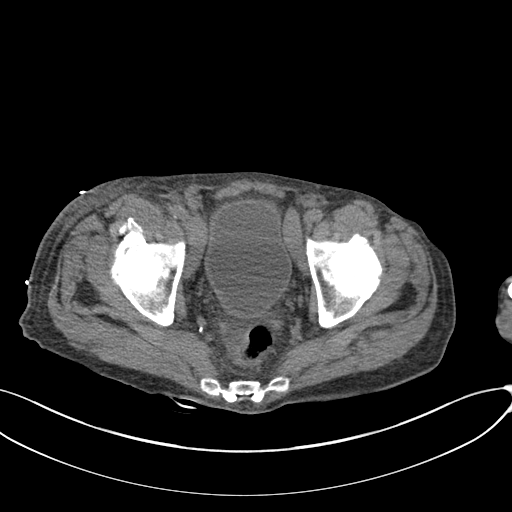
[im 53/111  soft-tissue]
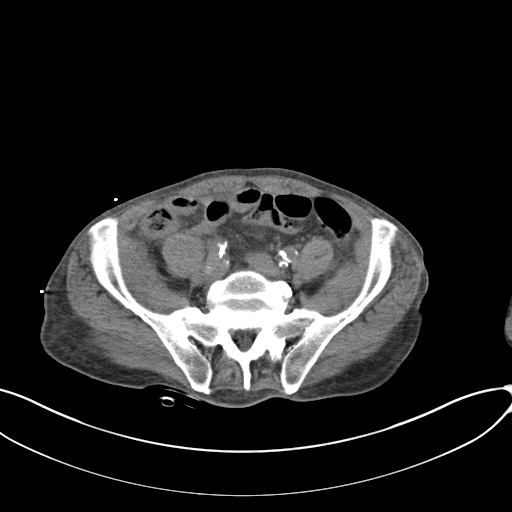
[im 58/111  soft-tissue]
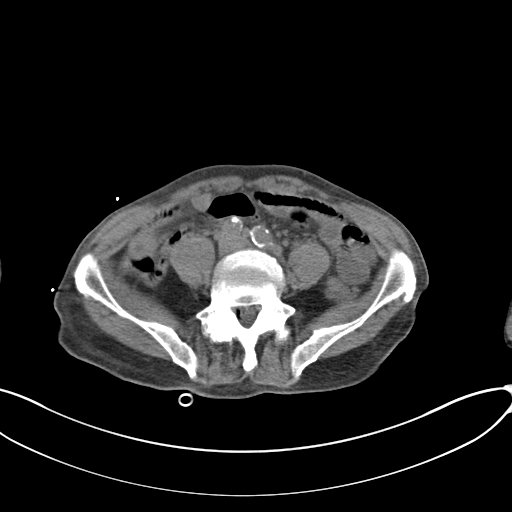
[im 70/111  soft-tissue]
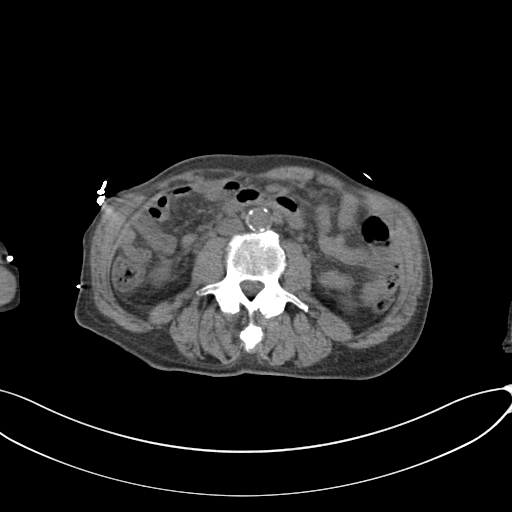
[im 76/111  soft-tissue]
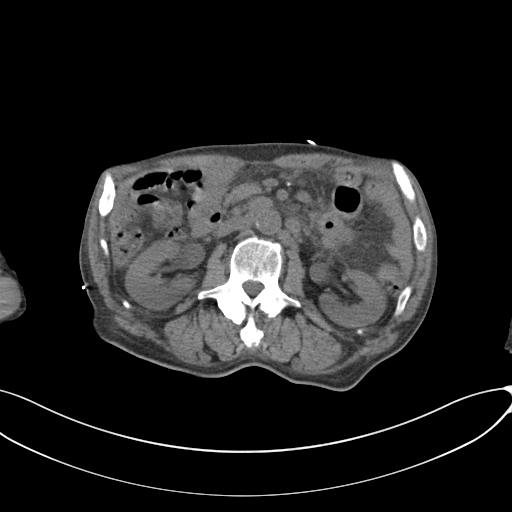
[im 76/111  bone]
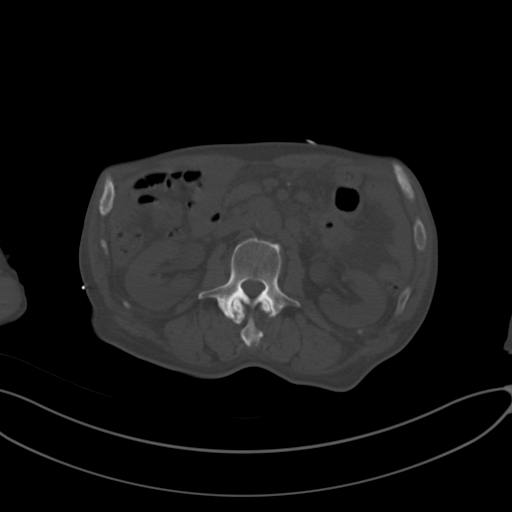
[im 87/111  soft-tissue]
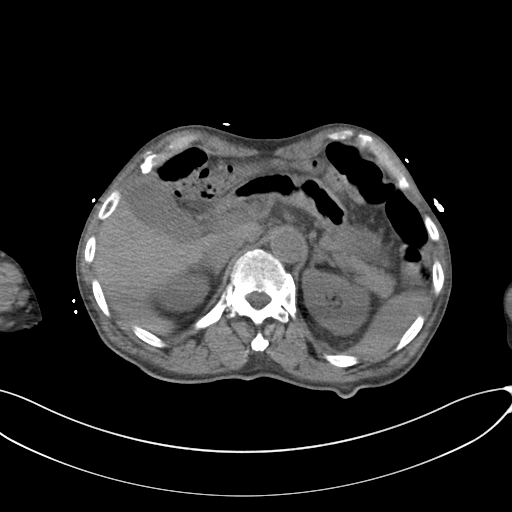
[im 93/111  soft-tissue]
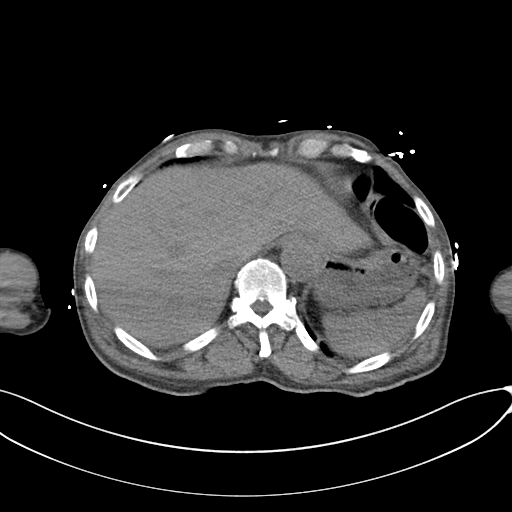
[im 105/111  soft-tissue]
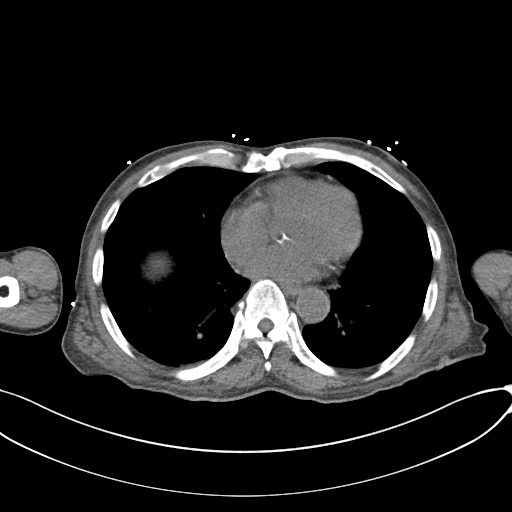

[Series 4: coronal · coronal · 0.74mm/px · 3 of 111 slices shown]
[im 37/111  soft-tissue]
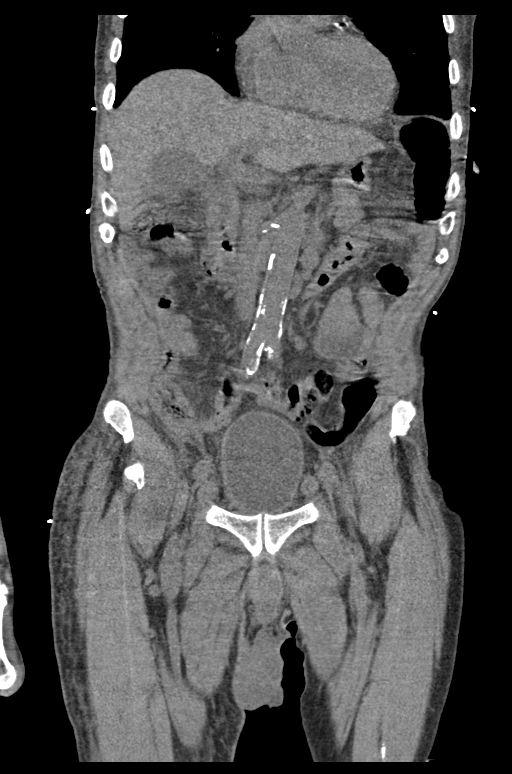
[im 49/111  soft-tissue]
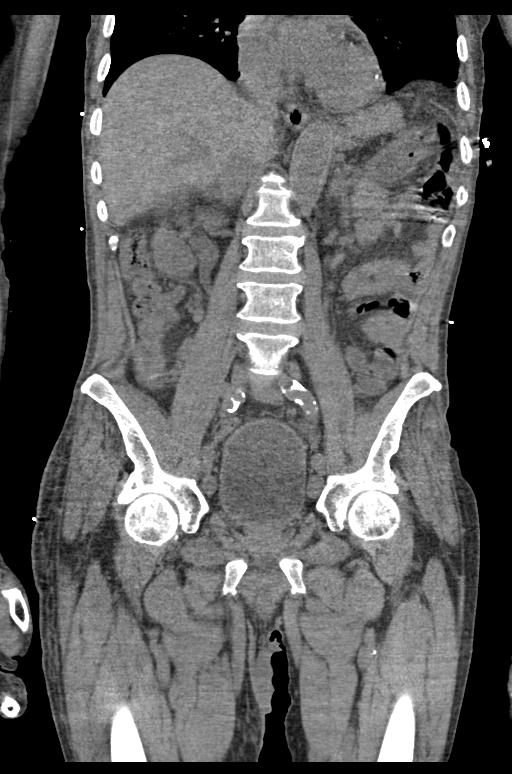
[im 62/111  soft-tissue]
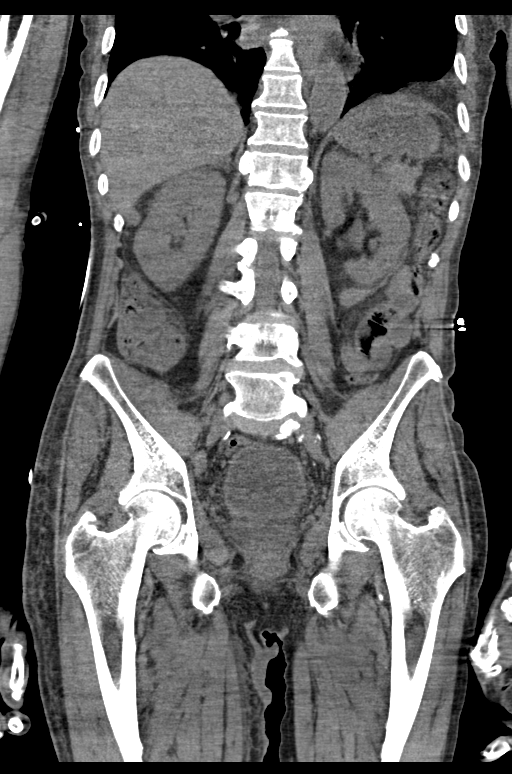

[15 of 46 positions shown; findings below may reference images not displayed]

FINDINGS: Lower chest: Lung bases demonstrate a new 7-8 mm nodule over the
posteromedial right lower lobe as well as new 1 cm pleural base
nodule over the posterior right lower lobe. Tiny amount of bilateral
pleural fluid. Calcification over the coronary arteries.

Hepatobiliary: Liver, gallbladder and biliary tree are unremarkable.

Pancreas: Normal.

Spleen: Normal.

Adrenals/Urinary Tract: Adrenal glands are normal. Kidneys are
normal in size without nephrolithiasis or focal mass. No
hydronephrosis. Ureters and bladder are normal.

Stomach/Bowel: Stomach and small bowel are normal. Appendix is
normal. Colon is unremarkable.

Vascular/Lymphatic: Mild-to-moderate calcified plaque over the
abdominal aorta which is normal in caliber.

Again noted is mild periaortic adenopathy as some of these nodes are
unchanged and some slightly increased in size while others have
decreased in size including near complete resolution of previously
seen necrotic 2.1 cm right periaortic lymph node. No significant
change in bilateral external iliac chain adenopathy. Worsening
bilateral superficial inguinal adenopathy right worse than left.

Reproductive: Prostate is normal. Increased heterogeneous soft
tissue density over the penile shaft likely patient's worsening of
patient's known cancer.

Other: Subcutaneous edema over the right upper leg and right pelvis
which is new.

Musculoskeletal: No new findings.
IMPRESSION: 1. Worsening heterogeneous soft tissue density over the penile shaft
likely representing progression of patient's known cancer and
possibly the cause of patient's hematuria. Evidence of known
metastatic disease with worsening bilateral superficial inguinal
adenopathy. Persistent periaortic and iliac chain adenopathy with
some nodes larger, some unchanged and some improved/resolved.

2. Two new nodules in the right lower lobe with the larger measuring
1 cm suspicious for metastatic disease.

3. Aortic Atherosclerosis ([id number].X). Atherosclerotic coronary
artery disease.
